# Patient Record
Sex: Female | Born: 1955 | Race: White | Hispanic: Refuse to answer | Smoking: Former smoker
Health system: Southern US, Community
[De-identification: ages and names within clinical notes are randomized; demographics above are authoritative.]

## PROBLEM LIST (undated history)

## (undated) DIAGNOSIS — S42409A Unspecified fracture of lower end of unspecified humerus, initial encounter for closed fracture: Secondary | ICD-10-CM

## (undated) DIAGNOSIS — T7840XA Allergy, unspecified, initial encounter: Secondary | ICD-10-CM

## (undated) DIAGNOSIS — M1990 Unspecified osteoarthritis, unspecified site: Secondary | ICD-10-CM

## (undated) DIAGNOSIS — E119 Type 2 diabetes mellitus without complications: Secondary | ICD-10-CM

## (undated) DIAGNOSIS — F329 Major depressive disorder, single episode, unspecified: Secondary | ICD-10-CM

## (undated) DIAGNOSIS — F419 Anxiety disorder, unspecified: Secondary | ICD-10-CM

## (undated) DIAGNOSIS — I10 Essential (primary) hypertension: Secondary | ICD-10-CM

## (undated) DIAGNOSIS — H269 Unspecified cataract: Secondary | ICD-10-CM

## (undated) DIAGNOSIS — E785 Hyperlipidemia, unspecified: Secondary | ICD-10-CM

## (undated) DIAGNOSIS — F32A Depression, unspecified: Secondary | ICD-10-CM

## (undated) HISTORY — DX: Unspecified osteoarthritis, unspecified site: M19.90

## (undated) HISTORY — PX: OTHER SURGICAL HISTORY: SHX169

## (undated) HISTORY — PX: FRACTURE SURGERY: SHX138

## (undated) HISTORY — DX: Depression, unspecified: F32.A

## (undated) HISTORY — DX: Essential (primary) hypertension: I10

## (undated) HISTORY — DX: Unspecified cataract: H26.9

## (undated) HISTORY — DX: Hyperlipidemia, unspecified: E78.5

## (undated) HISTORY — PX: TUBAL LIGATION: SHX77

## (undated) HISTORY — DX: Anxiety disorder, unspecified: F41.9

## (undated) HISTORY — DX: Type 2 diabetes mellitus without complications: E11.9

## (undated) HISTORY — DX: Major depressive disorder, single episode, unspecified: F32.9

## (undated) HISTORY — DX: Allergy, unspecified, initial encounter: T78.40XA

---

## 1998-02-14 ENCOUNTER — Emergency Department (HOSPITAL_COMMUNITY): Admission: EM | Admit: 1998-02-14 | Discharge: 1998-02-14 | Payer: Self-pay | Admitting: Emergency Medicine

## 1998-02-20 ENCOUNTER — Ambulatory Visit (HOSPITAL_COMMUNITY): Admission: RE | Admit: 1998-02-20 | Discharge: 1998-02-20 | Payer: Self-pay | Admitting: Internal Medicine

## 1998-05-14 ENCOUNTER — Emergency Department (HOSPITAL_COMMUNITY): Admission: EM | Admit: 1998-05-14 | Discharge: 1998-05-14 | Payer: Self-pay | Admitting: Emergency Medicine

## 1998-09-11 ENCOUNTER — Encounter: Payer: Self-pay | Admitting: Obstetrics and Gynecology

## 1998-09-11 ENCOUNTER — Ambulatory Visit (HOSPITAL_COMMUNITY): Admission: RE | Admit: 1998-09-11 | Discharge: 1998-09-11 | Payer: Self-pay | Admitting: Obstetrics and Gynecology

## 1998-09-21 ENCOUNTER — Ambulatory Visit (HOSPITAL_COMMUNITY): Admission: RE | Admit: 1998-09-21 | Discharge: 1998-09-21 | Payer: Self-pay | Admitting: Obstetrics and Gynecology

## 1998-09-21 ENCOUNTER — Encounter: Payer: Self-pay | Admitting: Obstetrics and Gynecology

## 1999-04-07 ENCOUNTER — Encounter: Payer: Self-pay | Admitting: Obstetrics and Gynecology

## 1999-04-07 ENCOUNTER — Ambulatory Visit (HOSPITAL_COMMUNITY): Admission: RE | Admit: 1999-04-07 | Discharge: 1999-04-07 | Payer: Self-pay | Admitting: Obstetrics and Gynecology

## 2000-07-06 ENCOUNTER — Other Ambulatory Visit: Admission: RE | Admit: 2000-07-06 | Discharge: 2000-07-06 | Payer: Self-pay | Admitting: Obstetrics and Gynecology

## 2000-07-10 ENCOUNTER — Encounter: Payer: Self-pay | Admitting: Obstetrics and Gynecology

## 2000-07-10 ENCOUNTER — Ambulatory Visit (HOSPITAL_COMMUNITY): Admission: RE | Admit: 2000-07-10 | Discharge: 2000-07-10 | Payer: Self-pay | Admitting: Obstetrics and Gynecology

## 2000-08-01 ENCOUNTER — Encounter: Payer: Self-pay | Admitting: Obstetrics and Gynecology

## 2000-08-01 ENCOUNTER — Ambulatory Visit (HOSPITAL_COMMUNITY): Admission: RE | Admit: 2000-08-01 | Discharge: 2000-08-01 | Payer: Self-pay | Admitting: Obstetrics and Gynecology

## 2000-09-11 ENCOUNTER — Encounter: Payer: Self-pay | Admitting: Obstetrics and Gynecology

## 2000-09-11 ENCOUNTER — Ambulatory Visit (HOSPITAL_COMMUNITY): Admission: RE | Admit: 2000-09-11 | Discharge: 2000-09-11 | Payer: Self-pay | Admitting: Obstetrics and Gynecology

## 2001-07-11 ENCOUNTER — Other Ambulatory Visit: Admission: RE | Admit: 2001-07-11 | Discharge: 2001-07-11 | Payer: Self-pay | Admitting: *Deleted

## 2001-07-27 ENCOUNTER — Encounter (INDEPENDENT_AMBULATORY_CARE_PROVIDER_SITE_OTHER): Payer: Self-pay | Admitting: Specialist

## 2001-07-27 ENCOUNTER — Ambulatory Visit (HOSPITAL_COMMUNITY): Admission: RE | Admit: 2001-07-27 | Discharge: 2001-07-27 | Payer: Self-pay | Admitting: *Deleted

## 2001-10-25 ENCOUNTER — Ambulatory Visit (HOSPITAL_COMMUNITY): Admission: RE | Admit: 2001-10-25 | Discharge: 2001-10-25 | Payer: Self-pay | Admitting: *Deleted

## 2002-08-15 ENCOUNTER — Other Ambulatory Visit: Admission: RE | Admit: 2002-08-15 | Discharge: 2002-08-15 | Payer: Self-pay | Admitting: *Deleted

## 2002-10-28 ENCOUNTER — Ambulatory Visit (HOSPITAL_COMMUNITY): Admission: RE | Admit: 2002-10-28 | Discharge: 2002-10-28 | Payer: Self-pay | Admitting: *Deleted

## 2003-06-18 ENCOUNTER — Encounter: Payer: Self-pay | Admitting: Emergency Medicine

## 2003-06-18 ENCOUNTER — Emergency Department (HOSPITAL_COMMUNITY): Admission: EM | Admit: 2003-06-18 | Discharge: 2003-06-18 | Payer: Self-pay | Admitting: Emergency Medicine

## 2003-09-09 ENCOUNTER — Other Ambulatory Visit: Admission: RE | Admit: 2003-09-09 | Discharge: 2003-09-09 | Payer: Self-pay | Admitting: *Deleted

## 2004-03-01 ENCOUNTER — Ambulatory Visit (HOSPITAL_COMMUNITY): Admission: RE | Admit: 2004-03-01 | Discharge: 2004-03-01 | Payer: Self-pay | Admitting: *Deleted

## 2004-08-10 ENCOUNTER — Encounter (INDEPENDENT_AMBULATORY_CARE_PROVIDER_SITE_OTHER): Payer: Self-pay | Admitting: *Deleted

## 2004-08-10 ENCOUNTER — Ambulatory Visit (HOSPITAL_COMMUNITY): Admission: RE | Admit: 2004-08-10 | Discharge: 2004-08-10 | Payer: Self-pay | Admitting: Gastroenterology

## 2005-03-02 ENCOUNTER — Ambulatory Visit (HOSPITAL_COMMUNITY): Admission: RE | Admit: 2005-03-02 | Discharge: 2005-03-02 | Payer: Self-pay | Admitting: *Deleted

## 2006-12-25 ENCOUNTER — Ambulatory Visit (HOSPITAL_COMMUNITY): Admission: RE | Admit: 2006-12-25 | Discharge: 2006-12-25 | Payer: Self-pay | Admitting: Family Medicine

## 2007-01-24 ENCOUNTER — Ambulatory Visit (HOSPITAL_COMMUNITY): Admission: RE | Admit: 2007-01-24 | Discharge: 2007-01-24 | Payer: Self-pay | Admitting: Family Medicine

## 2007-04-05 ENCOUNTER — Ambulatory Visit (HOSPITAL_COMMUNITY): Admission: RE | Admit: 2007-04-05 | Discharge: 2007-04-05 | Payer: Self-pay | Admitting: Cardiology

## 2007-04-05 ENCOUNTER — Encounter (INDEPENDENT_AMBULATORY_CARE_PROVIDER_SITE_OTHER): Payer: Self-pay | Admitting: Cardiology

## 2009-12-22 ENCOUNTER — Emergency Department (HOSPITAL_COMMUNITY): Admission: EM | Admit: 2009-12-22 | Discharge: 2009-12-22 | Payer: Self-pay | Admitting: Family Medicine

## 2009-12-25 ENCOUNTER — Ambulatory Visit: Payer: Self-pay | Admitting: Cardiovascular Disease

## 2009-12-25 ENCOUNTER — Inpatient Hospital Stay (HOSPITAL_COMMUNITY): Admission: RE | Admit: 2009-12-25 | Discharge: 2009-12-27 | Payer: Self-pay | Admitting: Internal Medicine

## 2010-10-31 ENCOUNTER — Encounter: Payer: Self-pay | Admitting: Family Medicine

## 2011-01-03 LAB — BASIC METABOLIC PANEL
CO2: 28 mEq/L (ref 19–32)
Calcium: 9.3 mg/dL (ref 8.4–10.5)
Chloride: 105 mEq/L (ref 96–112)
GFR calc non Af Amer: >60 mL/min (ref >60)
Glucose, Bld: 109 mg/dL — ABNORMAL HIGH (ref 70–99)
Potassium: 3.9 mEq/L (ref 3.5–5.1)

## 2011-01-03 LAB — CBC
HCT: 40.5 % (ref 36.0–46.0)
MCHC: 34.1 g/dL (ref 30.0–36.0)
MCV: 88.6 fL (ref 78.0–100.0)
RBC: 4.57 MIL/uL (ref 3.87–5.11)

## 2011-01-03 LAB — GLUCOSE, CAPILLARY: Glucose-Capillary: 116 mg/dL — ABNORMAL HIGH (ref 70–99)

## 2011-01-03 LAB — D-DIMER, QUANTITATIVE: D-Dimer, Quant: 1.34 ug/mL-FEU — ABNORMAL HIGH (ref 0.00–0.48)

## 2011-01-03 LAB — URINALYSIS, ROUTINE W REFLEX MICROSCOPIC
Ketones, ur: NEGATIVE mg/dL
Protein, ur: NEGATIVE mg/dL

## 2011-01-03 LAB — TROPONIN I: Troponin I: <0.01 ng/mL (ref 0.00–0.06)

## 2011-01-03 LAB — URINE MICROSCOPIC-ADD ON

## 2011-01-03 LAB — PROTIME-INR
INR: 1 (ref 0.00–1.49)
INR: 1.04 (ref 0.00–1.49)
Prothrombin Time: 13.1 seconds (ref 11.6–15.2)
Prothrombin Time: 13.5 seconds (ref 11.6–15.2)
Prothrombin Time: 16 seconds — ABNORMAL HIGH (ref 11.6–15.2)

## 2011-01-03 LAB — CK TOTAL AND CKMB (NOT AT ARMC): Relative Index: 1.9 (ref 0.0–2.5)

## 2011-02-19 IMAGING — CT CT ANGIO CHEST
2 of 6 series · 19 of 36 positions shown · IV contrast (APPLIED)
Comparison: Two-view chest x-ray 12/26/2007.

CLINICAL DATA: Status post right ankle surgery.  Shortness of
breath.  Elevated D-dimer.

CT ANGIOGRAPHY CHEST WITH CONTRAST
TECHNIQUE: Multidetector CT imaging of the chest was performed
using the standard protocol during bolus administration of
intravenous contrast.  Multiplanar CT image reconstructions
including MIPs were obtained to evaluate the vascular anatomy.
Contrast:  100 ml Omnipaque 300

[Series 9: pulm embolism 1.0 b25f thins · axial · 0.56mm/px · z∈[+2,+192]mm · 18 of 213 slices shown]
[im 11/213  lung]
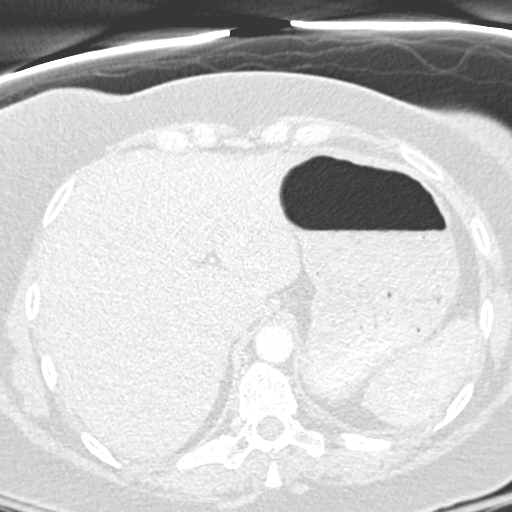
[im 22/213  mediastinal]
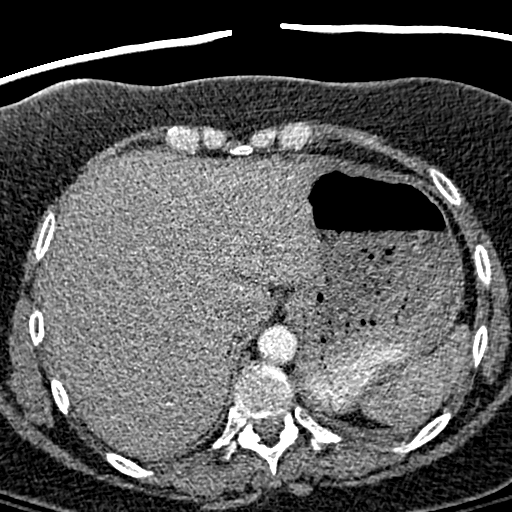
[im 32/213  lung]
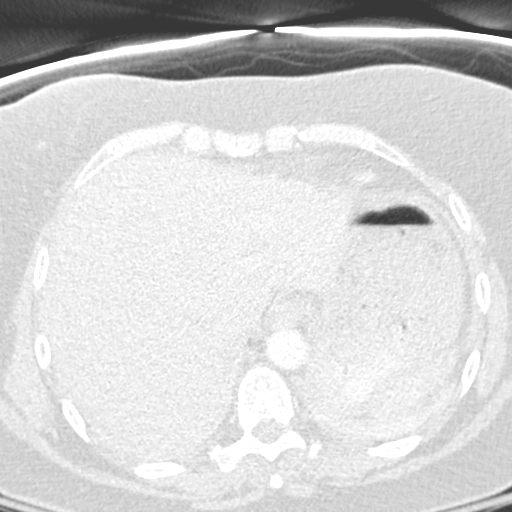
[im 43/213  mediastinal]
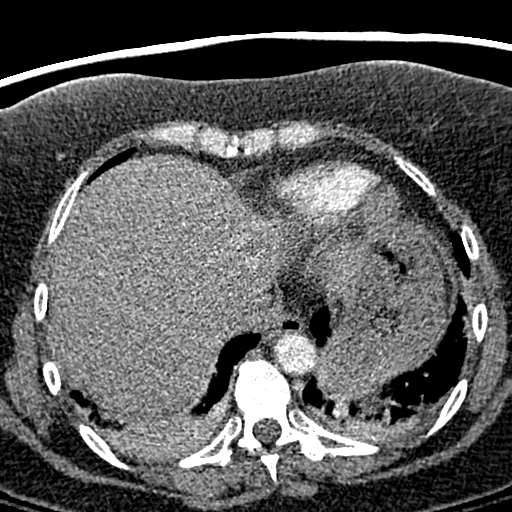
[im 54/213  lung]
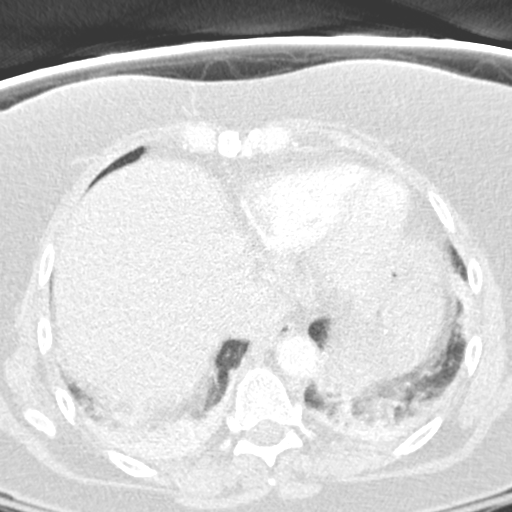
[im 64/213  mediastinal]
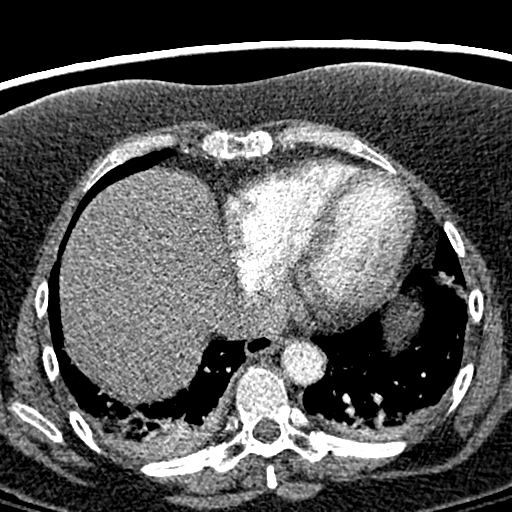
[im 75/213  lung]
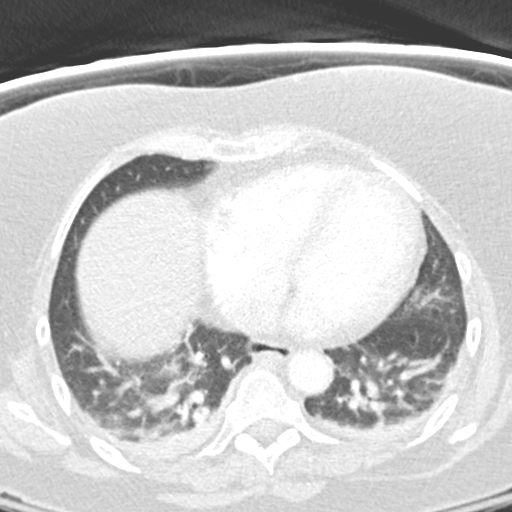
[im 85/213  mediastinal]
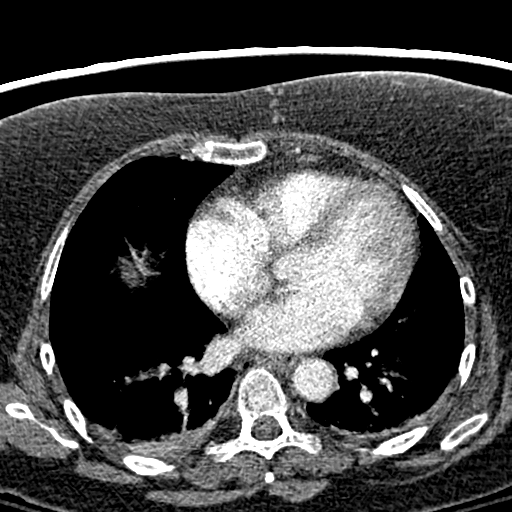
[im 96/213  lung]
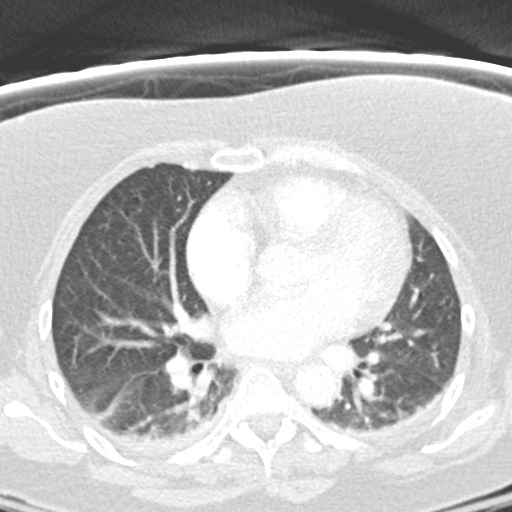
[im 117/213  mediastinal]
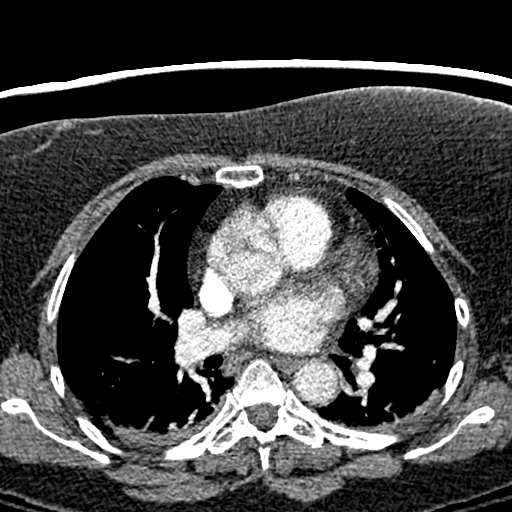
[im 128/213  lung]
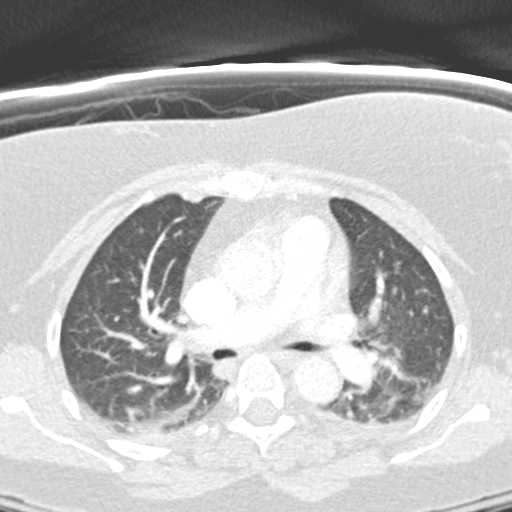
[im 138/213  mediastinal]
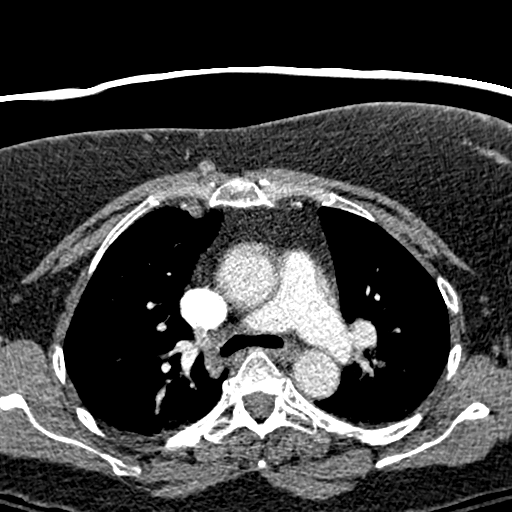
[im 149/213  lung]
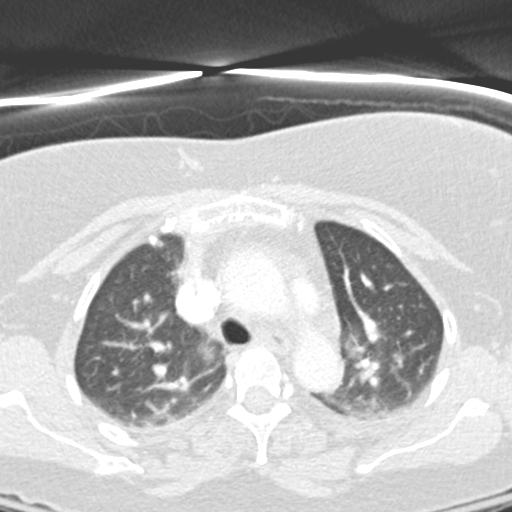
[im 160/213  mediastinal]
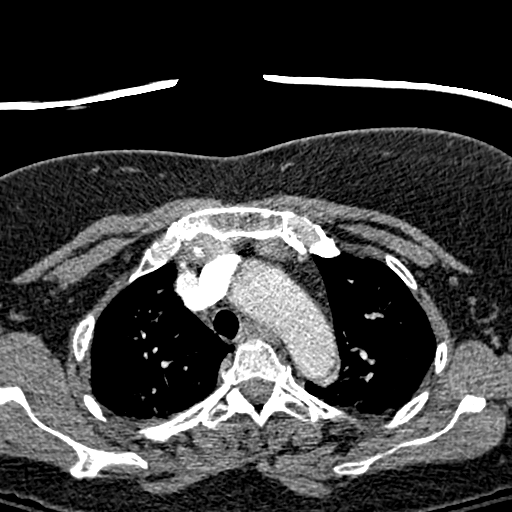
[im 170/213  lung]
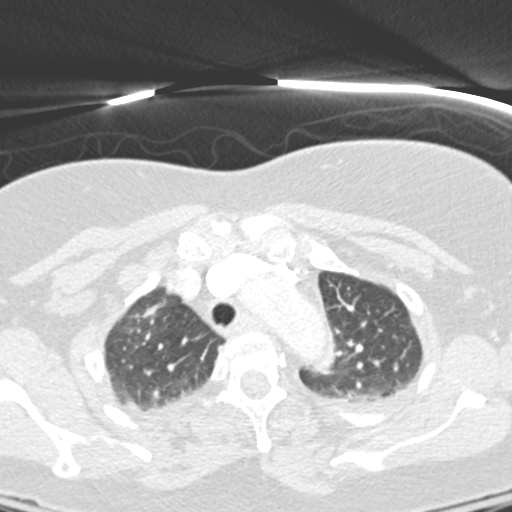
[im 181/213  mediastinal]
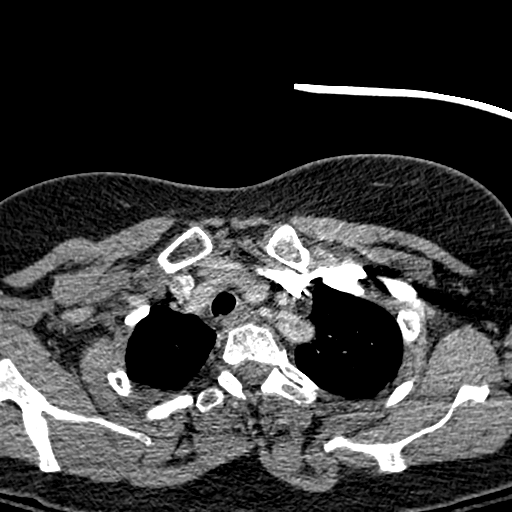
[im 191/213  lung]
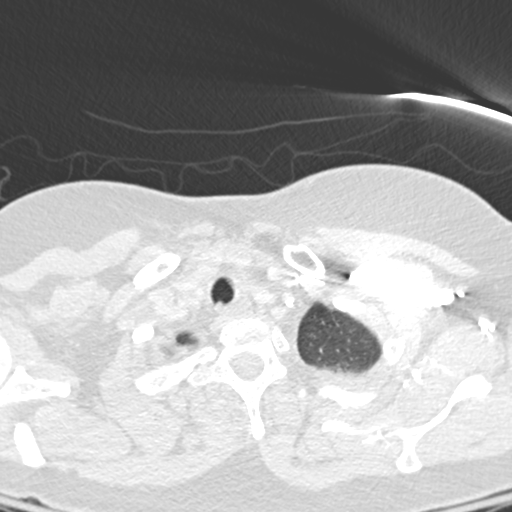
[im 202/213  mediastinal]
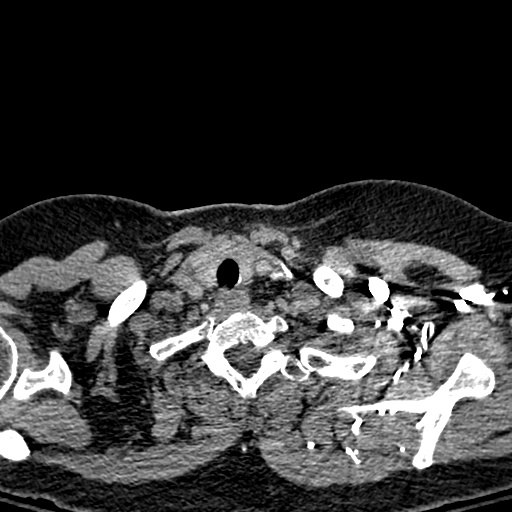

[Series 605: cor · coronal · 0.56mm/px · 1 of 62 slices shown]
[im 31/62  mediastinal]
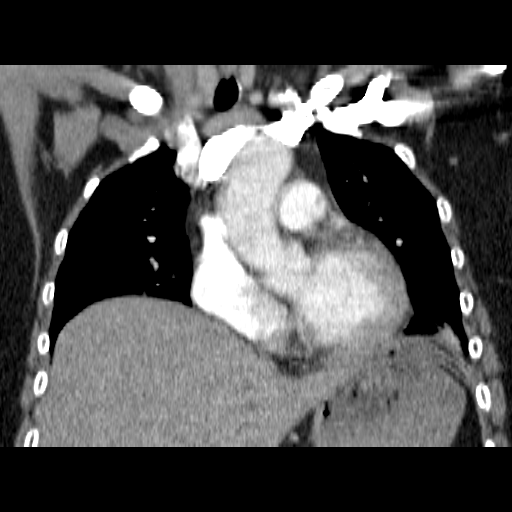

[19 of 36 positions shown; findings below may reference images not displayed]

FINDINGS: There is satisfactory opacification of the pulmonary
arterial system.  The study is mildly degraded by patient breathing
motion.  No focal filling defects are present to suggest pulmonary
emboli.  The heart size is normal.  No significant mediastinal or
axillary adenopathy is present.

Limited imaging of the upper abdomen is unremarkable.

Moderate dependent atelectasis is seen bilaterally.  There is
linear atelectasis in the right upper lobe as well.  No other
significant non dependent airspace disease is present.

Bone windows are unremarkable.

Review of the MIP images confirms the above findings.
IMPRESSION: 1.  No evidence for pulmonary embolus.
2.  Low lung volumes and moderate bilateral dependent atelectasis.

## 2011-02-25 NOTE — Op Note (Signed)
[facility]  Patient:    POONAM, WOEHRLE Visit Number: [id number] MRN: [id number]          Service Type: DSU Location: DAY Attending Physician:  Marin Comment Proc. Date: 07/27/01 Admit Date:  07/27/2001   CC:         Carola J. Gerri Spore, M.D.   Operative Report  PREOPERATIVE DIAGNOSES:  Menorrhagia and endometrial biopsy suggesting a ______  endometrial polyp.  POSTOPERATIVE DIAGNOSES:  Normal endometrium.  No evidence of polyp of submucosal myoma.  Enlarged uterine cavity.  PROCEDURE:  Exam under anesthesia, fractional D&C, and hysteroscopy.  ANESTHESIA:  MAC plus Marcaine paracervical block. resection of posterior wall consistent with submucosal myoma.  SURGEON:  Pershing Cox, M.D.  INDICATIONS:  The patient is a 55 year old gravida 4, para 2-0-2-2, female status post tubal ligation.  Endometrial biopsy was performed prompted by heavy vaginal bleeding.  This endometrial biopsy showed central necrosis of a fragment suggestive of endometrial polyp and for that reason, the patient is brought to the operating room today.  OPERATIVE FINDINGS:  The uterus itself was approximately 8 weeks in size, maybe slightly larger.  The fundus was not expanded.  On exam under anesthesia, no adnexal masses.  The cavity sounded to 3.5 inches.  The hysteroscopic diagnosis showed the cavity to be quite broad but atrophic. There were no polyps and no evidence of submucosal myoma.  DESCRIPTION OF PROCEDURE:  Albesa Seen was brought to the operating room with an IV in place.  On the OR table, Ancef was delivered in a dosage of 1 g. She had mottled facial and upper chest discoloration before the Ancef was administered.  She had no changes whatsoever to suggest an allergy to this medication.  The patient was sedated with MAC anesthesia and then was placed into Allen stirrups.  Hibiclens was used to perform a sterile vaginal and perineal prep.   The bladder was drained sterilely.  She was then draped for a vaginal procedure with a collecting drape beneath her hips to manage the effluent from our procedure.  Bivalve speculum was inserted into the vagina.  The cervix was visualized, and Marcaine was injected into the anterior cervix which was grasped with a single-tooth tenaculum.  Marcaine 0.25% was injected into the paracervical tissues at the 3, 4, 7, and 8 position to effect a a paracervical block, and 20 cc of this solution were used.  Kevorkian curette was used to obtain endocervical curettings.  Sound then passed into the endometrial cavity.  The cervix was then dilated with serial Pratt dilators to size 23.  The hysteroscope was introduced and using through-and-through sorbitol irrigation, the cavity was visualized and photographs were taken to document.  Resection was not necessary.  A small sharp curette was used to serially curette the uterine walls, and tissue was separately collected as endometrial curettings. The patient tolerated the procedure well and was taken to the recovery room in good condition. Attending Physician:  Marin Comment DD:  07/27/01 TD:  07/28/01 Job: [id number]

## 2011-02-25 NOTE — Op Note (Signed)
[name], [name]              ACCOUNT NO.:  [id number]   MEDICAL RECORD NO.:  [id number]          PATIENT TYPE:  AMB   LOCATION:  ENDO                         FACILITY:  [facility]   PHYSICIAN:  Danise Edge, M.D.   DATE OF BIRTH:  12/01/1955   DATE OF PROCEDURE:  08/10/2004  DATE OF DISCHARGE:                                 OPERATIVE REPORT   PROCEDURE:  Colonoscopy.   INDICATIONS FOR PROCEDURE:  Ms. Connie Wilson is a 55 year old female born  07-Sep-1956.  Ms. Connie Wilson has chronic lower abdominal cramping/bloating  with chronic constipation unassociated with gastrointestinal feeding.  Since  starting MiraLax, her intestinal symptoms have improved.   ENDOSCOPIST:  Danise Edge, M.D.   PREMEDICATION:  Versed 9.5 mg, Demerol 70 mg.   DESCRIPTION OF PROCEDURE:  After obtaining informed consent, Connie Wilson  was placed in the left lateral decubitus position.  I administered  intravenous Demerol and intravenous Versed to achieve conscious sedation for  the procedure.  The patient's blood pressure, oxygen saturation, and cardiac  rhythm were monitored throughout the procedure and documented in the medical  record.   Anal inspection and digital rectal exam were normal.  The Olympus adjustable  pediatric colonoscope was introduced into the rectum and advanced to the  cecum.  The colonic preparation for the exam today was excellent after she  consumed the MiraLax/Gatorade prep.   Rectum:  From the proximal rectum, a 1-mm sessile polyp was removed with  cold biopsy forceps.   Sigmoid colon and descending colon:  Normal.   Splenic flexure:  Normal.   Transverse colon:  Normal.   Hepatic flexure:  Normal.   Ascending colon:  Normal.   Cecum and ileocecal valve:  Normal.   ASSESSMENT:  A diminutive polyp was removed from the proximal rectum;  otherwise, normal proctocolonoscopy to the cecum.   RECOMMENDATIONS:  Ms. Connie Wilson can safely consume MiraLax daily or even  twice daily indefinitely to control her chronic constipation and bloating.   If the proximal rectal polyp returns neoplastic pathologically, she should  undergo a repeat colonoscopy in five years.      MJ/MEDQ  D:  08/10/2004  T:  08/10/2004  Job:  [id number]   cc:   Gretta Arab. Valentina Lucks, M.D.  [address] [place]  [place] [address]  Fax: [phone]

## 2011-09-15 ENCOUNTER — Ambulatory Visit: Payer: Self-pay

## 2011-09-15 DIAGNOSIS — E782 Mixed hyperlipidemia: Secondary | ICD-10-CM

## 2011-09-15 DIAGNOSIS — F339 Major depressive disorder, recurrent, unspecified: Secondary | ICD-10-CM

## 2011-09-15 DIAGNOSIS — I10 Essential (primary) hypertension: Secondary | ICD-10-CM

## 2011-09-15 DIAGNOSIS — M545 Low back pain: Secondary | ICD-10-CM

## 2012-01-06 ENCOUNTER — Ambulatory Visit: Payer: Self-pay | Admitting: Internal Medicine

## 2012-01-06 VITALS — BP 125/80 | HR 77 | Temp 97.9°F | Resp 16 | Ht 68.5 in | Wt 219.4 lb

## 2012-01-06 DIAGNOSIS — Z833 Family history of diabetes mellitus: Secondary | ICD-10-CM

## 2012-01-06 DIAGNOSIS — E669 Obesity, unspecified: Secondary | ICD-10-CM

## 2012-01-06 DIAGNOSIS — E785 Hyperlipidemia, unspecified: Secondary | ICD-10-CM

## 2012-01-06 DIAGNOSIS — I10 Essential (primary) hypertension: Secondary | ICD-10-CM | POA: Insufficient documentation

## 2012-01-06 LAB — POCT CBC
HCT, POC: 40.9 % (ref 37.7–47.9)
Hemoglobin: 13.5 g/dL (ref 12.2–16.2)
MCH, POC: 28.4 pg (ref 27–31.2)
MCV: 85.9 fL (ref 80–97)
RBC: 4.76 M/uL (ref 4.04–5.48)
WBC: 7.5 10*3/uL (ref 4.6–10.2)

## 2012-01-06 LAB — COMPREHENSIVE METABOLIC PANEL
ALT: 24 U/L (ref 0–35)
Alkaline Phosphatase: 75 U/L (ref 39–117)
Sodium: 138 mEq/L (ref 135–145)
Total Bilirubin: 0.4 mg/dL (ref 0.3–1.2)
Total Protein: 7 g/dL (ref 6.0–8.3)

## 2012-01-06 LAB — LIPID PANEL
HDL: 54 mg/dL (ref >39)
LDL Cholesterol: 129 mg/dL — ABNORMAL HIGH (ref 0–99)
Total CHOL/HDL Ratio: 4.1 Ratio

## 2012-01-06 MED ORDER — LISINOPRIL 20 MG PO TABS: 20.0000 mg | ORAL_TABLET | Freq: Every day | ORAL | Status: DC

## 2012-01-06 MED ORDER — CLONAZEPAM 0.5 MG PO TABS: ORAL_TABLET | ORAL | Status: DC

## 2012-01-06 MED ORDER — SERTRALINE HCL 50 MG PO TABS: 50.0000 mg | ORAL_TABLET | Freq: Three times a day (TID) | ORAL | Status: DC

## 2012-01-06 NOTE — Patient Instructions (Signed)
We will call you on Monday or mail results with your results from the lab/\.  RTC in 6 months.

## 2012-01-06 NOTE — Progress Notes (Signed)
  Subjective:    Patient ID: Connie Wilson, female    DOB: July 15, 1956, 56 y.o.   MRN: [id number]  Hypertension This is a chronic problem. Associated symptoms include anxiety. Pertinent negatives include no chest pain, headaches or palpitations. There are no associated agents to hypertension. Risk factors for coronary artery disease include post-menopausal state, obesity, stress and sedentary lifestyle.  Connie Wilson is here for a refill on her cholesterol, depression and BP  Medications.  She denies any significant changes to her health since we last saw her in Dec 2012.  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  She denies any CP and tells me her BP has been fine.  She does have a grandmother who had DM but she herself has never been diagnosed with a glucose problem.  She has no insurance and is under financial constraints.    Review of Systems  Constitutional: Negative.   HENT: Negative.   Eyes: Negative.   Respiratory: Negative.   Cardiovascular: Negative.  Negative for chest pain and palpitations.  Gastrointestinal: Negative.   Genitourinary: Negative.   Musculoskeletal: Negative.   Neurological: Negative.  Negative for headaches.  Hematological: Negative.   Psychiatric/Behavioral: Negative.   All other systems reviewed and are negative.       Objective:   Physical Exam  Vitals reviewed. Constitutional: She is oriented to person, place, and time. She appears well-developed and well-nourished.       Obese, weight gain 3 pounds since last OV    HENT:  Head: Normocephalic.  Eyes: Conjunctivae are normal.  Neck: Neck supple. No tracheal deviation present. No thyromegaly present.  Cardiovascular: Normal rate, regular rhythm and normal heart sounds.   Musculoskeletal: Normal range of motion.  Neurological: She is alert and oriented to person, place, and time.  Skin: Skin is warm and dry.  Psychiatric: She has a normal mood and affect. Her behavior is normal.           Assessment & Plan:  1.  HTN--good control, continue meds.  PT unable to give urine specimen, will collect at next OV.  RTC in 6 months for routine OV. 2.  Hyperlipidemia with elevated triglycerides.  Not fasting today.  Told to come fasting to next OV, pt agrees.  CMP and Lipid Panel pending. 3.  Depression--RF on her Zoloft TID and Clonazepam. 4.  Family History of DM--A1C is 5.6.

## 2012-01-09 ENCOUNTER — Other Ambulatory Visit: Payer: Self-pay

## 2012-01-09 ENCOUNTER — Encounter: Payer: Self-pay | Admitting: Radiology

## 2012-01-25 ENCOUNTER — Telehealth: Payer: Self-pay

## 2012-01-25 MED ORDER — ATORVASTATIN CALCIUM 40 MG PO TABS: ORAL_TABLET | ORAL | Status: DC

## 2012-01-25 NOTE — Telephone Encounter (Signed)
Pt states she was seen last week by Delton See and Delton See was to call in rx for cholestrol but it has not.

## 2012-01-25 NOTE — Telephone Encounter (Signed)
LMOM to cb

## 2012-01-25 NOTE — Telephone Encounter (Signed)
Please tell pt we will try her on atorvastatin instead of the pravachol which should be more effective in reducing her LDL cholesterol.  Hold the fenofibrate for now.  Please take 2 fish oil daily.

## 2012-01-26 NOTE — Telephone Encounter (Signed)
Pt called back and LM stating that the atorvastatin is much too expensive ($70 for 30 days) and she will either need to stay on the inexpensive chol med she has been on (pravastatin) unless there is another cheap alternative. Please advise and send in new Rx to pharm

## 2012-01-26 NOTE — Telephone Encounter (Signed)
Spoke with patient and explained to her to take atorvastatin and stop the fenofibrate and take 2 fish oil daily.  Patient stated that she understood directions.

## 2012-01-30 MED ORDER — PRAVASTATIN SODIUM 40 MG PO TABS: 40.0000 mg | ORAL_TABLET | Freq: Three times a day (TID) | ORAL | Status: DC

## 2012-01-30 NOTE — Telephone Encounter (Signed)
Tell pt she can return to the Pravastatin medication but needs to redouble her efforts in lifestyle modifications (exercise and any needed weight loss).  I suggest she make an appointment with Dr. Audria Nine at 102 and go in fasting in two months to see where her cholesterol is, it may be improved at that time.  Thanks.

## 2012-01-30 NOTE — Telephone Encounter (Signed)
Spoke with patient and let her know that she should stop the fenefibrate and that we cannot write another rx for pravastatin until she came in for a check up.  Patient stated that she understood.

## 2012-01-30 NOTE — Telephone Encounter (Signed)
Spoke with patient and told her to start the pravastatin again and make sure she eats right and exercises.  Also told her to make an appointment with Dr Audria Nine in two months to recheck cholesterol.  Patient stated that she understood and she didn't have any other questions at this time.

## 2012-01-30 NOTE — Telephone Encounter (Signed)
I want to keep her prescriptions as written to assist her in returning for a recheck in 2 months while she is on the medications, do not want her to be on the fenofibrate at this time

## 2012-01-30 NOTE — Telephone Encounter (Signed)
Patient called back and stated that she forgot to ask if we could fill pravastatin and fenofibrate for three month supply at [facility] on [address].

## 2012-03-21 ENCOUNTER — Other Ambulatory Visit: Payer: Self-pay | Admitting: Physician Assistant

## 2012-05-24 ENCOUNTER — Ambulatory Visit: Payer: Self-pay | Admitting: Emergency Medicine

## 2012-05-24 VITALS — BP 112/78 | HR 80 | Temp 98.1°F | Resp 16 | Ht 68.5 in | Wt 212.0 lb

## 2012-05-24 DIAGNOSIS — E785 Hyperlipidemia, unspecified: Secondary | ICD-10-CM

## 2012-05-24 DIAGNOSIS — K5289 Other specified noninfective gastroenteritis and colitis: Secondary | ICD-10-CM

## 2012-05-24 DIAGNOSIS — F341 Dysthymic disorder: Secondary | ICD-10-CM

## 2012-05-24 DIAGNOSIS — K529 Noninfective gastroenteritis and colitis, unspecified: Secondary | ICD-10-CM

## 2012-05-24 DIAGNOSIS — I10 Essential (primary) hypertension: Secondary | ICD-10-CM

## 2012-05-24 DIAGNOSIS — F329 Major depressive disorder, single episode, unspecified: Secondary | ICD-10-CM

## 2012-05-24 LAB — POCT UA - MICROSCOPIC ONLY
Casts, Ur, LPF, POC: NEGATIVE
Mucus, UA: NEGATIVE
Yeast, UA: NEGATIVE

## 2012-05-24 LAB — POCT CBC
Granulocyte percent: 78 %G (ref 37–80)
MCV: 88.6 fL (ref 80–97)
MID (cbc): 0.5 (ref 0–0.9)
MPV: 11.4 fL (ref 0–99.8)
POC Granulocyte: 6.4 (ref 2–6.9)
Platelet Count, POC: 260 10*3/uL (ref 142–424)
RBC: 4.96 M/uL (ref 4.04–5.48)
RDW, POC: 14.8 %

## 2012-05-24 LAB — COMPREHENSIVE METABOLIC PANEL
ALT: 20 U/L (ref 0–35)
AST: 16 U/L (ref 0–37)
Albumin: 4.3 g/dL (ref 3.5–5.2)
Alkaline Phosphatase: 82 U/L (ref 39–117)
BUN: 14 mg/dL (ref 6–23)
Calcium: 9.6 mg/dL (ref 8.4–10.5)
Chloride: 102 mEq/L (ref 96–112)
Potassium: 4.1 mEq/L (ref 3.5–5.3)
Sodium: 138 mEq/L (ref 135–145)
Total Protein: 7.2 g/dL (ref 6.0–8.3)

## 2012-05-24 LAB — POCT URINALYSIS DIPSTICK
Bilirubin, UA: NEGATIVE
Ketones, UA: NEGATIVE
Protein, UA: NEGATIVE
Spec Grav, UA: 1.02
pH, UA: 5

## 2012-05-24 LAB — LIPASE: Lipase: 43 U/L (ref 0–75)

## 2012-05-24 LAB — LIPID PANEL
Cholesterol: 251 mg/dL — ABNORMAL HIGH (ref 0–200)
Triglycerides: 200 mg/dL — ABNORMAL HIGH (ref <150)

## 2012-05-24 MED ORDER — LISINOPRIL 20 MG PO TABS: 20.0000 mg | ORAL_TABLET | Freq: Every day | ORAL | Status: DC

## 2012-05-24 MED ORDER — FENOFIBRATE 54 MG PO TABS: 54.0000 mg | ORAL_TABLET | Freq: Every day | ORAL | Status: DC

## 2012-05-24 MED ORDER — CLONAZEPAM 0.5 MG PO TABS: ORAL_TABLET | ORAL | Status: DC

## 2012-05-24 MED ORDER — SERTRALINE HCL 50 MG PO TABS: 50.0000 mg | ORAL_TABLET | Freq: Three times a day (TID) | ORAL | Status: DC

## 2012-05-24 MED ORDER — PROMETHAZINE HCL 25 MG PO TABS: 25.0000 mg | ORAL_TABLET | Freq: Three times a day (TID) | ORAL | Status: DC | PRN

## 2012-05-24 NOTE — Patient Instructions (Addendum)
1. Gastroenteritis  POCT urinalysis dipstick, POCT UA - Microscopic Only, POCT CBC, Comprehensive metabolic panel, Lipase, Amylase  2. Hypertension    3. Dyslipidemia  Lipid panel  4. Anxiety and depression    Viral Gastroenteritis Viral gastroenteritis is also known as stomach flu. This condition affects the stomach and intestinal tract. It can cause sudden diarrhea and vomiting. The illness typically lasts 3 to 8 days. Most people develop an immune response that eventually gets rid of the virus. While this natural response develops, the virus can make you quite ill. CAUSES  Many different viruses can cause gastroenteritis, such as rotavirus or noroviruses. You can catch one of these viruses by consuming contaminated food or water. You may also catch a virus by sharing utensils or other personal items with an infected person or by touching a contaminated surface. SYMPTOMS  The most common symptoms are diarrhea and vomiting. These problems can cause a severe loss of body fluids (dehydration) and a body salt (electrolyte) imbalance. Other symptoms may include:  Fever.   Headache.   Fatigue.   Abdominal pain.  DIAGNOSIS  Your caregiver can usually diagnose viral gastroenteritis based on your symptoms and a physical exam. A stool sample may also be taken to test for the presence of viruses or other infections. TREATMENT  This illness typically goes away on its own. Treatments are aimed at rehydration. The most serious cases of viral gastroenteritis involve vomiting so severely that you are not able to keep fluids down. In these cases, fluids must be given through an intravenous line (IV). HOME CARE INSTRUCTIONS   Drink enough fluids to keep your urine clear or pale yellow. Drink small amounts of fluids frequently and increase the amounts as tolerated.   Ask your caregiver for specific rehydration instructions.   Avoid:   Foods high in sugar.   Alcohol.   Carbonated drinks.   Tobacco.    Juice.   Caffeine drinks.   Extremely hot or cold fluids.   Fatty, greasy foods.   Too much intake of anything at one time.   Dairy products until 24 to 48 hours after diarrhea stops.   You may consume probiotics. Probiotics are active cultures of beneficial bacteria. They may lessen the amount and number of diarrheal stools in adults. Probiotics can be found in yogurt with active cultures and in supplements.   Wash your hands well to avoid spreading the virus.   Only take over-the-counter or prescription medicines for pain, discomfort, or fever as directed by your caregiver. Do not give aspirin to children. Antidiarrheal medicines are not recommended.   Ask your caregiver if you should continue to take your regular prescribed and over-the-counter medicines.   Keep all follow-up appointments as directed by your caregiver.  SEEK IMMEDIATE MEDICAL CARE IF:   You are unable to keep fluids down.   You do not urinate at least once every 6 to 8 hours.   You develop shortness of breath.   You notice blood in your stool or vomit. This may look like coffee grounds.   You have abdominal pain that increases or is concentrated in one small area (localized).   You have persistent vomiting or diarrhea.   You have a fever.   The patient is a child younger than 3 months, and he or she has a fever.   The patient is a child older than 3 months, and he or she has a fever and persistent symptoms.   The patient is a child  older than 3 months, and he or she has a fever and symptoms suddenly get worse.   The patient is a baby, and he or she has no tears when crying.  MAKE SURE YOU:   Understand these instructions.   Will watch your condition.   Will get help right away if you are not doing well or get worse.  Document Released: 09/26/2005 Document Revised: 09/15/2011 Document Reviewed: 07/13/2011 [facility] Patient Information 2012 [place], [place].Place gastroenteritis patient  instructions here.

## 2012-05-24 NOTE — Progress Notes (Signed)
Subjective:    Patient ID: Connie Wilson, female    DOB: December 09, 1955, 56 y.o.   MRN: [id number]  HPIThis 56 y.o. female presents for evaluation of vomiting, diarrhea, abdominal pain. Onset three days ago.  Awoke with abdominal pain.  Every time she eats, having b.m.  +nausea and vomiting.  Stools 5-6 times per day.  Gotten a rash on bottom; previously prescribed cream by other provider at other clinic.  No fever/+chills/sweats.  Tmax 98.1.  +malaise, +myalgias.  +nausea.  +vomited x 1; food contents; non-bilious non-bloody.  +diarrhea stools watery non-bloody.  +blood with wiping due to rectal irritation; using cotonelle wet wipes.  Has been using topical cream to bottom x 1 month with persistent rash; sweats a lot at work.  No sick contacts.  No antibiotics recently; no travel foreign.  No camping.  No history of diverticulitis.  Worried about Crohn's disease.  Has suffered with gas and bloating.  Does not like probiotics.  Last colonoscopy 5-6 years ago by [facility]; +polyps.  No history of diverticulosis.  No history of chronic diarrhea or chronic constipation.  Bland diet; vegetarian at baseline.    HTN:  Six month follow-up on HTN.  Compliance with medication; good tolerance to medication; good symptom control.  Does not check blood pressure at home.  Denies chest pain, palpitations,shortness of breath, or leg swelling.  Needs refills.  Hyperlipidemia: six month follow-up for cholesterol.  Stopped Pravastatin per recommendation of Benny Lennert; now only taking Fenofibrate. Compliance with fenofibrate; good tolerance of medication; good symptom control.  Fasting today.  Denies chest pain, palpitations, shortness of breath, numbness, tingling, focal weakness.    Depression/anxiety: six month follow-up.  Emotionally stable at this time.  No major stressors recently. Compliance with medications; good tolerance with medications; good symptoms control.  Denies excessive worry, sadness, isolation,  hopelessness.  Taking Sertraline tid; Klonopin tid PRN.   Has suffered with chronic insomnia since menopause age 21.  PMH:  HTN, hyperlipidemia, Depression.   PCP: [facility]/Sarah Weber     Review of Systems  Constitutional: Positive for chills, appetite change and fatigue. Negative for fever.  HENT: Negative for congestion and rhinorrhea.   Eyes: Negative for photophobia and visual disturbance.  Respiratory: Negative for cough, shortness of breath and wheezing.   Cardiovascular: Negative for chest pain, palpitations and leg swelling.  Gastrointestinal: Positive for nausea, vomiting, abdominal pain, diarrhea and anal bleeding. Negative for constipation, blood in stool, abdominal distention and rectal pain.  Skin: Negative for rash.  Neurological: Negative for dizziness, tremors, syncope, facial asymmetry, weakness, light-headedness, numbness and headaches.  Psychiatric/Behavioral: Positive for disturbed wake/sleep cycle and dysphoric mood. Negative for suicidal ideas, behavioral problems, confusion, decreased concentration and agitation. The patient is nervous/anxious. The patient is not hyperactive.     No past medical history on file.  No past surgical history on file.  Prior to Admission medications   Medication Sig Start Date End Date Taking? Authorizing Provider  calcium & magnesium carbonates (MYLANTA) 311-232 MG per tablet Take 1 tablet by mouth daily.   Yes Historical Provider, MD  clonazePAM (KLONOPIN) 0.5 MG tablet Take one in the am, two at night as needed 05/24/12  Yes Ethelda Chick, MD  clotrimazole (LOTRIMIN) 1 % cream Apply topically 2 (two) times daily.   Yes Historical Provider, MD  fenofibrate 54 MG tablet Take 1 tablet (54 mg total) by mouth daily. 05/24/12  Yes Ethelda Chick, MD  fish oil-omega-3 fatty acids 1000 MG capsule  Take 2 g by mouth daily.   Yes Historical Provider, MD  lisinopril (PRINIVIL,ZESTRIL) 20 MG tablet Take 1 tablet (20 mg total) by mouth daily. 05/24/12   Yes Ethelda Chick, MD  pravastatin (PRAVACHOL) 40 MG tablet Take 1 tablet (40 mg total) by mouth 3 (three) times daily. 01/30/12  Yes Rickard Patience, PA-C  sertraline (ZOLOFT) 50 MG tablet Take 1 tablet (50 mg total) by mouth 3 (three) times daily. 05/24/12  Yes Ethelda Chick, MD  promethazine (PHENERGAN) 25 MG tablet Take 1 tablet (25 mg total) by mouth every 8 (eight) hours as needed for nausea. 05/24/12 05/31/12  Ethelda Chick, MD    Allergies  Allergen Reactions  . Penicillins Rash    History   Social History  . Marital Status: Divorced    Spouse Name: N/A    Number of Children: N/A  . Years of Education: N/A   Occupational History  . Not on file.   Social History Main Topics  . Smoking status: Never Smoker   . Smokeless tobacco: Not on file  . Alcohol Use: No  . Drug Use: Not on file  . Sexually Active: Not on file   Other Topics Concern  . Not on file   Social History Narrative  . No narrative on file    No family history on file.    Objective:   Physical Exam  Nursing note and vitals reviewed. Constitutional: She is oriented to person, place, and time. She appears well-developed and well-nourished. No distress.  HENT:  Head: Normocephalic and atraumatic.  Mouth/Throat: Oropharynx is clear and moist.  Eyes: Conjunctivae and EOM are normal. Pupils are equal, round, and reactive to light.  Neck: Normal range of motion. Neck supple. No thyromegaly present.  Cardiovascular: Normal rate, regular rhythm, normal heart sounds and intact distal pulses.  Exam reveals no gallop and no friction rub.   No murmur heard. Pulmonary/Chest: Effort normal and breath sounds normal. No respiratory distress. She has no wheezes. She has no rales. She exhibits no tenderness.  Abdominal: Soft. Bowel sounds are normal. She exhibits no distension and no mass. There is tenderness. There is no rebound and no guarding.       +TTP EPIGASTRIC REGION>RUQ.  MILD DIFFUSE TTP; NO G/R.    Lymphadenopathy:    She has no cervical adenopathy.  Neurological: She is alert and oriented to person, place, and time. She has normal reflexes. No cranial nerve deficit.  Skin: Skin is warm and dry. She is not diaphoretic.  Psychiatric: She has a normal mood and affect. Her behavior is normal. Judgment and thought content normal.      Results for orders placed in visit on 05/24/12  POCT URINALYSIS DIPSTICK      Component Value Range   Color, UA yellow     Clarity, UA clear     Glucose, UA neg     Bilirubin, UA neg     Ketones, UA neg     Spec Grav, UA 1.020     Blood, UA trace     pH, UA 5.0     Protein, UA neg     Urobilinogen, UA 0.2     Nitrite, UA neg     Leukocytes, UA Negative    POCT UA - MICROSCOPIC ONLY      Component Value Range   WBC, Ur, HPF, POC 3-5     RBC, urine, microscopic neg     Bacteria, U Microscopic 2+  Mucus, UA neg     Epithelial cells, urine per micros 8-12     Crystals, Ur, HPF, POC neg     Casts, Ur, LPF, POC neg     Yeast, UA neg    POCT CBC      Component Value Range   WBC 8.2  4.6 - 10.2 K/uL   Lymph, poc 1.3  0.6 - 3.4   POC LYMPH PERCENT 16.1  10 - 50 %L   MID (cbc) 0.5  0 - 0.9   POC MID % 5.9  0 - 12 %M   POC Granulocyte 6.4  2 - 6.9   Granulocyte percent 78.0  37 - 80 %G   RBC 4.96  4.04 - 5.48 M/uL   Hemoglobin 13.6  12.2 - 16.2 g/dL   HCT, POC 16.1  09.6 - 47.9 %   MCV 88.6  80 - 97 fL   MCH, POC 27.4  27 - 31.2 pg   MCHC 31.0 (*) 31.8 - 35.4 g/dL   RDW, POC 04.5     Platelet Count, POC 260  142 - 424 K/uL   MPV 11.4  0 - 99.8 fL      Assessment & Plan:   1. Gastroenteritis  POCT urinalysis dipstick, POCT UA - Microscopic Only, POCT CBC, Comprehensive metabolic panel, Lipase, Amylase  2. Hypertension    3. Dyslipidemia  Lipid panel  4. Anxiety and depression     1. Gastroenteritis: new.  BRAT diet, hydration.  Rx for Phenergan provided and counseled regarding sedation side effect.  RTC if no improvement in 72  hours or sooner if worse.  If diarrhea persists more than 7-10 days, will warrant stool studies.  No suggestion of Crohn's disease at this time. 2.  HTN: controlled; no change in medications; obtain labs.  RF provided. 3.  Dyslipidemia:  Moderately controlled. No change in medications; obtain labs. RF provided. 4.  Anxiety/depression:  Stable on current regimen; RF provided.  F/u 6 months PCP.

## 2012-05-31 MED ORDER — PRAVASTATIN SODIUM 40 MG PO TABS: 40.0000 mg | ORAL_TABLET | Freq: Three times a day (TID) | ORAL | Status: DC

## 2012-06-01 MED ORDER — PRAVASTATIN SODIUM 40 MG PO TABS: 40.0000 mg | ORAL_TABLET | Freq: Every day | ORAL | Status: DC

## 2012-06-04 NOTE — Progress Notes (Signed)
Spoke with patient 06/04/12, she is not interested in scheduling a follow up appt at this time.

## 2012-09-04 ENCOUNTER — Emergency Department (INDEPENDENT_AMBULATORY_CARE_PROVIDER_SITE_OTHER)
Admission: EM | Admit: 2012-09-04 | Discharge: 2012-09-04 | Disposition: A | Discharge status: Home / Self Care | Payer: Self-pay | Source: Home / Self Care

## 2012-09-04 ENCOUNTER — Encounter (HOSPITAL_COMMUNITY): Payer: Self-pay | Admitting: *Deleted

## 2012-09-04 DIAGNOSIS — M542 Cervicalgia: Secondary | ICD-10-CM

## 2012-09-04 DIAGNOSIS — M25552 Pain in left hip: Secondary | ICD-10-CM

## 2012-09-04 DIAGNOSIS — S161XXA Strain of muscle, fascia and tendon at neck level, initial encounter: Secondary | ICD-10-CM

## 2012-09-04 DIAGNOSIS — M25559 Pain in unspecified hip: Secondary | ICD-10-CM

## 2012-09-04 DIAGNOSIS — M545 Low back pain, unspecified: Secondary | ICD-10-CM

## 2012-09-04 MED ORDER — KETOROLAC TROMETHAMINE 60 MG/2ML IM SOLN
60.0000 mg | Freq: Once | INTRAMUSCULAR | Status: AC
  Administered 2012-09-04: 60 mg via INTRAMUSCULAR

## 2012-09-04 MED ORDER — TRAMADOL HCL 50 MG PO TABS: 50.0000 mg | ORAL_TABLET | Freq: Four times a day (QID) | ORAL | Status: DC | PRN

## 2012-09-04 MED ORDER — KETOROLAC TROMETHAMINE 30 MG/ML IJ SOLN
INTRAMUSCULAR | Status: AC
  Filled 2012-09-04: qty 2

## 2012-09-04 MED ORDER — NAPROXEN 500 MG PO TBEC: 500.0000 mg | DELAYED_RELEASE_TABLET | Freq: Two times a day (BID) | ORAL | Status: DC
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
## 2012-09-05 NOTE — ED Provider Notes (Signed)
Medical screening examination/treatment/procedure(s) were performed by resident physician or non-physician practitioner and as supervising physician I was immediately available for consultation/collaboration.   Benard Minturn DOUGLAS MD.    Viki Carrera D Tegan Britain, MD 09/05/12 1518 

## 2012-09-25 ENCOUNTER — Telehealth: Payer: Self-pay

## 2012-09-25 NOTE — Telephone Encounter (Signed)
Pt needs to talk with someone regarding her blood pressure States she has been playing phone tag Please call pt at [phone]

## 2012-09-26 NOTE — Telephone Encounter (Signed)
The best thing to do would be for her to come in at her earliest convenience so she can discuss with a provider.

## 2012-09-26 NOTE — Telephone Encounter (Signed)
I spoke to patient she states her blood pressure has been elevated 130/107. I advised her she was due for lab work in November, she states she has been putting this off, because she is in school and working full time. She states she can try to come in on Friday or the weekend, but wants me to ask if we can do anything for her blood pressure before then, she currently takes Lisinopril 20mg .

## 2012-09-27 NOTE — Telephone Encounter (Signed)
Spoke with pt, advised to RTC. Pt understood. 

## 2012-09-29 ENCOUNTER — Ambulatory Visit: Payer: Self-pay | Admitting: Physician Assistant

## 2012-09-29 VITALS — BP 142/90 | HR 83 | Temp 98.3°F | Resp 16 | Ht 68.0 in | Wt 218.0 lb

## 2012-09-29 DIAGNOSIS — E782 Mixed hyperlipidemia: Secondary | ICD-10-CM

## 2012-09-29 DIAGNOSIS — E7800 Pure hypercholesterolemia, unspecified: Secondary | ICD-10-CM

## 2012-09-29 DIAGNOSIS — I10 Essential (primary) hypertension: Secondary | ICD-10-CM

## 2012-09-29 DIAGNOSIS — Z79899 Other long term (current) drug therapy: Secondary | ICD-10-CM

## 2012-09-29 DIAGNOSIS — R7309 Other abnormal glucose: Secondary | ICD-10-CM

## 2012-09-29 LAB — COMPREHENSIVE METABOLIC PANEL
ALT: 16 U/L (ref 0–35)
AST: 19 U/L (ref 0–37)
Albumin: 4.1 g/dL (ref 3.5–5.2)
CO2: 24 mEq/L (ref 19–32)
Calcium: 8.9 mg/dL (ref 8.4–10.5)
Chloride: 104 mEq/L (ref 96–112)
Potassium: 3.9 mEq/L (ref 3.5–5.3)
Sodium: 135 mEq/L (ref 135–145)
Total Protein: 6.7 g/dL (ref 6.0–8.3)

## 2012-09-29 LAB — POCT CBC
MCH, POC: 26.9 pg — AB (ref 27–31.2)
MCHC: 30.7 g/dL — AB (ref 31.8–35.4)
MID (cbc): 0.7 (ref 0–0.9)
MPV: 10.3 fL (ref 0–99.8)
POC LYMPH PERCENT: 24.2 %L (ref 10–50)
POC MID %: 10.4 %M (ref 0–12)
Platelet Count, POC: 247 10*3/uL (ref 142–424)
RBC: 4.84 M/uL (ref 4.04–5.48)
RDW, POC: 14.9 %
WBC: 6.3 10*3/uL (ref 4.6–10.2)

## 2012-09-29 LAB — LIPID PANEL
LDL Cholesterol: 134 mg/dL — ABNORMAL HIGH (ref 0–99)
Triglycerides: 149 mg/dL (ref <150)

## 2012-09-29 MED ORDER — CLONAZEPAM 0.5 MG PO TABS: ORAL_TABLET | ORAL | Status: DC

## 2012-09-29 MED ORDER — CITALOPRAM HYDROBROMIDE 40 MG PO TABS: 40.0000 mg | ORAL_TABLET | Freq: Every day | ORAL | Status: DC

## 2012-09-29 MED ORDER — PRAVASTATIN SODIUM 40 MG PO TABS: 40.0000 mg | ORAL_TABLET | Freq: Every day | ORAL | Status: DC

## 2012-09-29 MED ORDER — LISINOPRIL-HYDROCHLOROTHIAZIDE 20-25 MG PO TABS: 1.0000 | ORAL_TABLET | Freq: Every day | ORAL | Status: DC

## 2012-09-29 MED ORDER — FENOFIBRATE 54 MG PO TABS: 54.0000 mg | ORAL_TABLET | Freq: Every day | ORAL | Status: DC

## 2012-09-29 NOTE — Progress Notes (Signed)
  Subjective:    Patient ID: Connie Wilson, female    DOB: Jan 31, 1956, 56 y.o.   MRN: [id number]  HPI 56 yr old CF presents with multiple issues.  She is overdue for bloodwork.  She had elevated glucose in august. She also had elevated lipids and was started on pravachol.  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 She is on plain lisinopril, but she has been on the combo with HCTZ before and has done well on it.    She wants to switch from sertraline to a cheaper antidepressant.  She has been on citalopram and paxil before.  She doesn't remember having problems with either. She doesn't like prozac.  She is taking clonazepam 3 tablets daily.  She hasn't been exercising or eating healthy.  She is vegetarian.  She eats a lot of breads, pastas, and convenience foods.  Review of Systems  All other systems reviewed and are negative.       Objective:   Physical Exam  Nursing note and vitals reviewed. Constitutional: She is oriented to person, place, and time. She appears well-developed and well-nourished.  HENT:  Head: Normocephalic and atraumatic.  Neck: Normal range of motion. No thyromegaly present.  Cardiovascular: Normal rate, regular rhythm and normal heart sounds.   Pulmonary/Chest: Effort normal and breath sounds normal.  Neurological: She is alert and oriented to person, place, and time.  Skin: Skin is warm and dry.  Psychiatric: She has a normal mood and affect. Her behavior is normal.      Assessment & Plan:  Hypertension-suboptimal control.  Will d/c plain lisinopril and start lisinopril/hctz 20/25. Hyperlipidemia-checking status. Continue meds for now. Anxiety/depression- stable on sertraline, but patient wishes to change secondary to cost concerns.  She can stop the sertraline after she finishes her current prescription and start citalopram. Ok to continue clonazepam.

## 2012-10-10 DIAGNOSIS — Z0271 Encounter for disability determination: Secondary | ICD-10-CM

## 2012-11-14 ENCOUNTER — Telehealth: Payer: Self-pay | Admitting: *Deleted

## 2012-11-14 NOTE — Telephone Encounter (Signed)
Pt called about concerns with her blood pressure being up.  She states that it usually runs high when she is at school.  Today she had a reading of 141/95 and pulse of 85.  She wanted to know if she needed to make any adjustments to her meds or maybe keep a log for at least a week (i suggested this since she has not been logging bps at home).

## 2012-11-14 NOTE — Telephone Encounter (Signed)
Agree with having patient keep a log and forward that info to [place] who saw her for this in December. We cannot make an adjustment over an isolated reading over the phone.

## 2012-11-14 NOTE — Telephone Encounter (Signed)
Pt agreed with keeping a log and will call back in a week with readings

## 2013-01-15 ENCOUNTER — Ambulatory Visit: Payer: Self-pay | Admitting: Family Medicine

## 2013-01-15 VITALS — BP 112/76 | HR 74 | Temp 97.4°F | Resp 16 | Ht 68.5 in | Wt 205.0 lb

## 2013-01-15 DIAGNOSIS — F4010 Social phobia, unspecified: Secondary | ICD-10-CM

## 2013-01-15 DIAGNOSIS — E785 Hyperlipidemia, unspecified: Secondary | ICD-10-CM

## 2013-01-15 DIAGNOSIS — R7309 Other abnormal glucose: Secondary | ICD-10-CM

## 2013-01-15 DIAGNOSIS — I10 Essential (primary) hypertension: Secondary | ICD-10-CM

## 2013-01-15 MED ORDER — CITALOPRAM HYDROBROMIDE 40 MG PO TABS: 40.0000 mg | ORAL_TABLET | Freq: Every day | ORAL | Status: DC

## 2013-01-15 MED ORDER — FENOFIBRATE 54 MG PO TABS: 54.0000 mg | ORAL_TABLET | Freq: Every day | ORAL | Status: DC

## 2013-01-15 MED ORDER — LORATADINE 10 MG PO TABS: 10.0000 mg | ORAL_TABLET | Freq: Every day | ORAL | Status: DC

## 2013-01-15 MED ORDER — LISINOPRIL-HYDROCHLOROTHIAZIDE 20-25 MG PO TABS: 1.0000 | ORAL_TABLET | Freq: Every day | ORAL | Status: DC

## 2013-01-15 MED ORDER — PRAVASTATIN SODIUM 40 MG PO TABS: 40.0000 mg | ORAL_TABLET | Freq: Every day | ORAL | Status: DC

## 2013-01-15 MED ORDER — CLONAZEPAM 0.5 MG PO TABS: ORAL_TABLET | ORAL | Status: DC

## 2013-01-15 NOTE — Progress Notes (Signed)
Subjective:    Patient ID: Connie Wilson, female    DOB: [birth year], 57 y.o.   MRN: [id number]   Chief Complaint  Patient presents with  . Medication Refill  . Allergies    2 weeks    HPI  Req a 90 d refill on all medicaitons.   Has had the standard allergies of itchy eyes, stuffy nose, scratchy throat.  And she blew her nose so hard she got very dizzy and now not hearing out of right ear as well.  She kept feeling ears pop.  Now nose not running but still coughing.  Coughing more when laying down and when she gets hot.  Was taking mucinex sinus max but doesn't know what she can use for this as she has HTN and the coricidin products don't work for her. She is hoping to start a new job next wk at [employer] and worried she will be let go if they think she is sick right away. She has a lot of anxiety in social situations, esp in groups. She states the switch from zoloft to citalopram seems to be uneventful. She is using the klonopin as needed tid - anywhere from 1/2 tab occasionally to tid.   We called [facility] where she gets her meds refilled and ran pt through the [facility] and she last got 90 tabs of klonopin in August from Dr. Katrinka Blazing. [facility] pharmacy states she never picked up the clonazepam that Marylene Land prescribed to her Dec.  Past Medical History  Diagnosis Date  . Hyperlipidemia   . Hypertension   . Anxiety   . Depression    Current Outpatient Prescriptions on File Prior to Visit  Medication Sig Dispense Refill  . Ascorbic Acid (VITAMIN C) 1000 MG tablet Take 1,000 mg by mouth daily.      . calcium & magnesium carbonates (MYLANTA) 311-232 MG per tablet Take 1 tablet by mouth daily.      . calcium citrate (CALCITRATE - DOSED IN MG ELEMENTAL CALCIUM) 950 MG tablet Take 1 tablet by mouth 2 (two) times daily.      . fish oil-omega-3 fatty acids 1000 MG capsule Take 1 g by mouth 2 (two) times daily.       . Multiple Vitamin (MULTIVITAMIN) tablet Take 1 tablet by mouth daily.      .  clotrimazole (LOTRIMIN) 1 % cream Apply topically 2 (two) times daily.      . promethazine (PHENERGAN) 25 MG tablet Take 1 tablet (25 mg total) by mouth every 8 (eight) hours as needed for nausea.  20 tablet  0   No current facility-administered medications on file prior to visit.   Allergies  Allergen Reactions  . Penicillins Rash   Review of Systems  Constitutional: Positive for fatigue. Negative for fever, chills, diaphoresis, activity change and appetite change.  HENT: Positive for hearing loss, ear pain, congestion, sore throat, rhinorrhea, sneezing, postnasal drip and sinus pressure. Negative for nosebleeds, trouble swallowing, neck pain, neck stiffness, voice change, tinnitus and ear discharge.   Eyes: Positive for itching. Negative for discharge and visual disturbance.  Respiratory: Positive for cough. Negative for shortness of breath.   Cardiovascular: Negative for chest pain, palpitations and leg swelling.  Gastrointestinal: Negative for nausea, vomiting and abdominal pain.  Genitourinary: Negative for decreased urine volume.  Skin: Negative for rash.  Neurological: Negative for dizziness, syncope and headaches.  Hematological: Negative for adenopathy. Does not bruise/bleed easily.  Psychiatric/Behavioral: Negative for sleep disturbance.  BP 112/76  Pulse 74  Temp(Src) 97.4 F (36.3 C) (Oral)  Resp 16  Ht 5' 8.5" (1.74 m)  Wt 205 lb (92.987 kg)  BMI 30.71 kg/m2  SpO2 98% Objective:   Physical Exam  Constitutional: She is oriented to person, place, and time. She appears well-developed and well-nourished. She does not appear ill. No distress.  HENT:  Head: Normocephalic and atraumatic.  Right Ear: Tympanic membrane, external ear and ear canal normal.  Left Ear: Tympanic membrane, external ear and ear canal normal.  Nose: Mucosal edema and rhinorrhea present.  Mouth/Throat: Uvula is midline and mucous membranes are normal. No oropharyngeal exudate, posterior  oropharyngeal edema, posterior oropharyngeal erythema or tonsillar abscesses.  Right TM partially obscured by wax  Eyes: Conjunctivae are normal. Right eye exhibits no discharge. Left eye exhibits no discharge. No scleral icterus.  Neck: Normal range of motion. Neck supple.  Cardiovascular: Normal rate, regular rhythm, normal heart sounds and intact distal pulses.   Pulmonary/Chest: Effort normal and breath sounds normal.  Lymphadenopathy:       Head (right side): No preauricular and no posterior auricular adenopathy present.       Head (left side): No submandibular, no preauricular and no posterior auricular adenopathy present.    She has no cervical adenopathy.       Right: No supraclavicular adenopathy present.       Left: No supraclavicular adenopathy present.  Neurological: She is alert and oriented to person, place, and time.  Skin: Skin is warm and dry. She is not diaphoretic. No erythema.  Psychiatric: She has a normal mood and affect. Her behavior is normal.      Assessment & Plan:  Hyperlipidemia - recheck flp at f/u.  HTN (hypertension) - veyr well controlled - improved w/ weightloss.  Elevated glucose - a1c 5.8 in Oct. Try to decrease carbs and recheck at f/u  Social anxiety disorder - cont celexa and klonopin which she is using prn - by her hx 90 tabs should last her far longer than 6 mos.  She needs to bring in her bottle at f/u so we can see how much she has left.  HM - overdue for CPP - sched at f/u after she gets insurance.  Meds ordered this encounter  Medications  . DISCONTD: sertraline (ZOLOFT) 50 MG tablet    Sig: Take 50 mg by mouth 3 (three) times daily.  . citalopram (CELEXA) 40 MG tablet    Sig: Take 1 tablet (40 mg total) by mouth daily.    Dispense:  90 tablet    Refill:  1    Order Specific Question:  Supervising Provider    Answer:  DOOLITTLE, ROBERT P [[id number]]  . pravastatin (PRAVACHOL) 40 MG tablet    Sig: Take 1 tablet (40 mg total) by mouth daily.     Dispense:  90 tablet    Refill:  1    Order Specific Question:  Supervising Provider    Answer:  DOOLITTLE, ROBERT P [[id number]]  . lisinopril-hydrochlorothiazide (PRINZIDE,ZESTORETIC) 20-25 MG per tablet    Sig: Take 1 tablet by mouth daily.    Dispense:  90 tablet    Refill:  1    Order Specific Question:  Supervising Provider    Answer:  DOOLITTLE, ROBERT P [[id number]]  . fenofibrate 54 MG tablet    Sig: Take 1 tablet (54 mg total) by mouth daily.    Dispense:  90 tablet    Refill:  1    Order  Specific Question:  Supervising Provider    Answer:  DOOLITTLE, ROBERT P [[id number]]  . clonazePAM (KLONOPIN) 0.5 MG tablet    Sig: Take one in the am, two at night as needed    Dispense:  90 tablet    Refill:  0    Order Specific Question:  Supervising Provider    Answer:  DOOLITTLE, ROBERT P [[id number]]  . loratadine (CLARITIN) 10 MG tablet    Sig: Take 1 tablet (10 mg total) by mouth daily.    Dispense:  30 tablet    Refill:  5

## 2013-01-15 NOTE — Patient Instructions (Addendum)
Do not take any otc medications - coricidin products or mucinex are fine - do not take products with phenylephrine or pseudoephedrine.   DASH Diet The DASH diet stands for "Dietary Approaches to Stop Hypertension." It is a healthy eating plan that has been shown to reduce high blood pressure (hypertension) in as little as 14 days, while also possibly providing other significant health benefits. These other health benefits include reducing the risk of breast cancer after menopause and reducing the risk of type 2 diabetes, heart disease, colon cancer, and stroke. Health benefits also include weight loss and slowing kidney failure in patients with chronic kidney disease.  DIET GUIDELINES  Limit salt (sodium). Your diet should contain less than 1500 mg of sodium daily.  Limit refined or processed carbohydrates. Your diet should include mostly whole grains. Desserts and added sugars should be used sparingly.  Include small amounts of heart-healthy fats. These types of fats include nuts, oils, and tub margarine. Limit saturated and trans fats. These fats have been shown to be harmful in the body. CHOOSING FOODS  The following food groups are based on a 2000 calorie diet. See your Registered Dietitian for individual calorie needs. Grains and Grain Products (6 to 8 servings daily)  Eat More Often: Whole-wheat bread, brown rice, whole-grain or wheat pasta, quinoa, popcorn without added fat or salt (air popped).  Eat Less Often: White bread, white pasta, white rice, cornbread. Vegetables (4 to 5 servings daily)  Eat More Often: Fresh, frozen, and canned vegetables. Vegetables may be raw, steamed, roasted, or grilled with a minimal amount of fat.  Eat Less Often/Avoid: Creamed or fried vegetables. Vegetables in a cheese sauce. Fruit (4 to 5 servings daily)  Eat More Often: All fresh, canned (in natural juice), or frozen fruits. Dried fruits without added sugar. One hundred percent fruit juice ( cup  [237 mL] daily).  Eat Less Often: Dried fruits with added sugar. Canned fruit in light or heavy syrup. [employer], Fish, and Poultry (2 servings or less daily. One serving is 3 to 4 oz [85-114 g]).  Eat More Often: Ninety percent or leaner ground beef, tenderloin, sirloin. Round cuts of beef, chicken breast, [place] breast. All fish. Grill, bake, or broil your meat. Nothing should be fried.  Eat Less Often/Avoid: Fatty cuts of meat, [place], or chicken leg, thigh, or wing. Fried cuts of meat or fish. Dairy (2 to 3 servings)  Eat More Often: Low-fat or fat-free milk, low-fat plain or light yogurt, reduced-fat or part-skim cheese.  Eat Less Often/Avoid: Milk (whole, 2%).Whole milk yogurt. Full-fat cheeses. Nuts, Seeds, and Legumes (4 to 5 servings per week)  Eat More Often: All without added salt.  Eat Less Often/Avoid: Salted nuts and seeds, canned beans with added salt. Fats and Sweets (limited)  Eat More Often: Vegetable oils, tub margarines without trans fats, sugar-free gelatin. Mayonnaise and salad dressings.  Eat Less Often/Avoid: Coconut oils, palm oils, butter, stick margarine, cream, half and half, cookies, candy, pie. FOR MORE INFORMATION The Dash Diet Eating Plan: www.dashdiet.org Document Released: 09/15/2011 Document Revised: 12/19/2011 Document Reviewed: 09/15/2011 [facility] Patient Information 2013 [place], [place].

## 2013-02-23 ENCOUNTER — Ambulatory Visit: Payer: BC Managed Care – PPO

## 2013-02-23 ENCOUNTER — Encounter (HOSPITAL_COMMUNITY): Payer: Self-pay

## 2013-02-23 ENCOUNTER — Ambulatory Visit (INDEPENDENT_AMBULATORY_CARE_PROVIDER_SITE_OTHER): Payer: BC Managed Care – PPO | Admitting: Emergency Medicine

## 2013-02-23 ENCOUNTER — Ambulatory Visit (HOSPITAL_COMMUNITY)
Admission: RE | Admit: 2013-02-23 | Discharge: 2013-02-23 | Disposition: A | Discharge status: Home / Self Care | Payer: BC Managed Care – PPO | Source: Ambulatory Visit | Attending: Emergency Medicine | Admitting: Emergency Medicine

## 2013-02-23 VITALS — BP 128/82 | HR 77 | Temp 98.2°F | Resp 17 | Ht 69.0 in | Wt 203.0 lb

## 2013-02-23 DIAGNOSIS — K5730 Diverticulosis of large intestine without perforation or abscess without bleeding: Secondary | ICD-10-CM | POA: Insufficient documentation

## 2013-02-23 DIAGNOSIS — R141 Gas pain: Secondary | ICD-10-CM | POA: Insufficient documentation

## 2013-02-23 DIAGNOSIS — R82998 Other abnormal findings in urine: Secondary | ICD-10-CM

## 2013-02-23 DIAGNOSIS — K5900 Constipation, unspecified: Secondary | ICD-10-CM | POA: Insufficient documentation

## 2013-02-23 DIAGNOSIS — R142 Eructation: Secondary | ICD-10-CM | POA: Insufficient documentation

## 2013-02-23 DIAGNOSIS — R109 Unspecified abdominal pain: Secondary | ICD-10-CM

## 2013-02-23 DIAGNOSIS — R8281 Pyuria: Secondary | ICD-10-CM

## 2013-02-23 DIAGNOSIS — D72829 Elevated white blood cell count, unspecified: Secondary | ICD-10-CM

## 2013-02-23 DIAGNOSIS — N854 Malposition of uterus: Secondary | ICD-10-CM | POA: Insufficient documentation

## 2013-02-23 DIAGNOSIS — E119 Type 2 diabetes mellitus without complications: Secondary | ICD-10-CM

## 2013-02-23 DIAGNOSIS — R143 Flatulence: Secondary | ICD-10-CM | POA: Insufficient documentation

## 2013-02-23 LAB — POCT CBC
Granulocyte percent: 64.6 %G (ref 37–80)
Hemoglobin: 14.1 g/dL (ref 12.2–16.2)
MCH, POC: 29.1 pg (ref 27–31.2)
MCV: 90.9 fL (ref 80–97)
RBC: 4.85 M/uL (ref 4.04–5.48)
WBC: 11.1 10*3/uL — AB (ref 4.6–10.2)

## 2013-02-23 LAB — GLUCOSE, POCT (MANUAL RESULT ENTRY): POC Glucose: 75 mg/dl (ref 70–99)

## 2013-02-23 LAB — POCT UA - MICROSCOPIC ONLY: Crystals, Ur, HPF, POC: NEGATIVE

## 2013-02-23 LAB — POCT URINALYSIS DIPSTICK
Blood, UA: NEGATIVE
Glucose, UA: NEGATIVE
Protein, UA: NEGATIVE
Spec Grav, UA: >=1.03
Urobilinogen, UA: 0.2

## 2013-02-23 MED ORDER — IOHEXOL 300 MG/ML  SOLN
50.0000 mL | Freq: Once | INTRAMUSCULAR | Status: AC | PRN
  Administered 2013-02-23: 50 mL via ORAL

## 2013-02-23 MED ORDER — IOHEXOL 300 MG/ML  SOLN
100.0000 mL | Freq: Once | INTRAMUSCULAR | Status: AC | PRN
  Administered 2013-02-23: 100 mL via INTRAVENOUS

## 2013-02-23 NOTE — Progress Notes (Signed)
Subjective:    Patient ID: Connie Wilson, female    DOB: 18-Mar-1956, 57 y.o.   MRN: [id number]  HPI  57 year old female presents with stomach pain and bloating.  Unable to have bowel movement.  Had a colonoscopy at 50.  Father died from colon cancer.  If she has a bowel movement it is only 3 small stools.  Has a bowel movement 2 to 3 times a day.  Has not had one in the last 24 hours.  She is a CNA taking care of people.  Having stomach pain and getting nauseated.  No urinary symptoms at present    Review of Systems     Objective:   Physical Exam patient is alert and cooperative not ill-appearing her neck is supple. Chest is clear to auscultation and percussion cardiac exam reveals a regular rate no murmurs. The abdomen is flat there are bowel sounds present. There is a healed scar around the umbilicus. There is diffuse tenderness in the lower abdomen on the right and left side as well as suprapubic area. [facility] reading (PRIMARY) by  Dr. Cleta Alberts is a good amount of stool there is no evidence of obstruction there is no free air Results for orders placed in visit on 02/23/13  POCT CBC      Result Value Range   WBC 11.1 (*) 4.6 - 10.2 K/uL   Lymph, poc 3.2  0.6 - 3.4   POC LYMPH PERCENT 29.2  10 - 50 %L   MID (cbc) 0.7  0 - 0.9   POC MID % 6.2  0 - 12 %M   POC Granulocyte 7.2 (*) 2 - 6.9   Granulocyte percent 64.6  37 - 80 %G   RBC 4.85  4.04 - 5.48 M/uL   Hemoglobin 14.1  12.2 - 16.2 g/dL   HCT, POC 40.9  81.1 - 47.9 %   MCV 90.9  80 - 97 fL   MCH, POC 29.1  27 - 31.2 pg   MCHC 32.0  31.8 - 35.4 g/dL   RDW, POC 91.4     Platelet Count, POC 262  142 - 424 K/uL   MPV 12.1  0 - 99.8 fL  POCT UA - MICROSCOPIC ONLY      Result Value Range   WBC, Ur, HPF, POC 4-13     RBC, urine, microscopic 0-3     Bacteria, U Microscopic 4+     Mucus, UA large     Epithelial cells, urine per micros neg     Crystals, Ur, HPF, POC neg     Casts, Ur, LPF, POC waxy     Yeast, UA pos    POCT URINALYSIS  DIPSTICK      Result Value Range   Color, UA yellow     Clarity, UA clear     Glucose, UA neg     Bilirubin, UA neg     Ketones, UA trace     Spec Grav, UA >=1.030     Blood, UA neg     pH, UA 5.5     Protein, UA neg     Urobilinogen, UA 0.2     Nitrite, UA neg     Leukocytes, UA Trace     [facility] reading (PRIMARY) by  Dr.Ramces Shomaker no fractures seen         Assessment & Plan:  We'll start with a CBC urine ketones abdominal series. Will refer back to GI because she does have a  family history colon cancer. Referral made to gastroenterology. Advise her to take MiraLax as needed for constipation her CT report showed only diverticulosis. She needs to be on either Citrucel or Metamucil daily.

## 2013-02-24 LAB — URINE CULTURE: Colony Count: 45000

## 2013-03-08 ENCOUNTER — Telehealth: Payer: Self-pay

## 2013-03-08 NOTE — Telephone Encounter (Signed)
Called her left detailed message for her, and advised her to call me back

## 2013-03-08 NOTE — Telephone Encounter (Signed)
What is she requesting? Sertraline 50mg  tid

## 2013-03-08 NOTE — Telephone Encounter (Signed)
Zoloft was dc'ed in 09/2012 and changed to Celexa. She should not be taking these together. Which one would she like to continue?

## 2013-03-08 NOTE — Telephone Encounter (Signed)
Pt states that she called her pharmacy for a refill and when she called back after they contacted [place] they said that we stated that we was unaware of her taking this medication She states that she has been taking this certain medication for years Call back number is [phone] Pharmacy is [facility]

## 2013-03-08 NOTE — Telephone Encounter (Signed)
Advised pt of notes from ov from Dec ov that she was changed to Celexa.  Pt will call pharmacy to get rx for celexa.

## 2013-03-31 ENCOUNTER — Telehealth: Payer: Self-pay

## 2013-03-31 NOTE — Telephone Encounter (Signed)
Patient has concerns about side effects from her generic celexa.  She is having muscular pain, headaches and sick to her stomach. She tried heat, ice, and tylenol. Please call back at [phone]. She works third shift so she would like a call back quickly in case she goes to sleep.

## 2013-04-03 NOTE — Telephone Encounter (Signed)
She does not want to go back on the Zoloft, she has d/c the Celexa. Please advise, she needs generic (cost effective)

## 2013-04-03 NOTE — Telephone Encounter (Signed)
I've reviewed her chart.  She's been on citalopram (celexa) since 09/2012 (and had been on it before as well).  Did she start having adverse effects of muscle pain, HA and sick to her stomach then?  If the symptoms were more recent, it's unlikely they were caused by the citalopram, and she may want to consider restarting it.  In 09/2012, when she was switched from sertraline to citalopram (because she needed something less costly), the note indicates she'd also tried paxil (paroxetine) before.  It's also on the "$4 List" at [facility], and would be another good alternative.

## 2013-04-03 NOTE — Telephone Encounter (Signed)
Called patient left message for her to call me back

## 2013-04-03 NOTE — Telephone Encounter (Signed)
It looks like she was on Zoloft before. Does she want to switch back to this or try something different? She should stop the Celexa if she is having these bad side effects but since she is on 40 mg may need to taper to 1/2 for 1 week then stop.

## 2013-04-04 MED ORDER — PAROXETINE HCL 40 MG PO TABS: 40.0000 mg | ORAL_TABLET | ORAL | Status: DC

## 2013-04-04 NOTE — Telephone Encounter (Signed)
Pt would like to increase her clonazepam back to 3 a day "before she does something crazy" she stated. Best# [phone]

## 2013-04-04 NOTE — Telephone Encounter (Signed)
Sent Paxil to pharmacy.  Pt will need to discuss increased benzo dose with the provider that prescribes that medication for her

## 2013-04-04 NOTE — Telephone Encounter (Signed)
Thanks. She would like to try the Paxil. She does not want to go back on Celexa or Zoloft

## 2013-04-06 ENCOUNTER — Emergency Department (HOSPITAL_COMMUNITY)
Admission: EM | Admit: 2013-04-06 | Discharge: 2013-04-06 | Disposition: A | Discharge status: Home / Self Care | Payer: BC Managed Care – PPO | Attending: Emergency Medicine | Admitting: Emergency Medicine

## 2013-04-06 ENCOUNTER — Encounter (HOSPITAL_COMMUNITY): Payer: Self-pay

## 2013-04-06 DIAGNOSIS — F411 Generalized anxiety disorder: Secondary | ICD-10-CM | POA: Insufficient documentation

## 2013-04-06 DIAGNOSIS — F3289 Other specified depressive episodes: Secondary | ICD-10-CM | POA: Insufficient documentation

## 2013-04-06 DIAGNOSIS — Z760 Encounter for issue of repeat prescription: Secondary | ICD-10-CM

## 2013-04-06 DIAGNOSIS — Z8739 Personal history of other diseases of the musculoskeletal system and connective tissue: Secondary | ICD-10-CM | POA: Insufficient documentation

## 2013-04-06 DIAGNOSIS — R109 Unspecified abdominal pain: Secondary | ICD-10-CM | POA: Insufficient documentation

## 2013-04-06 DIAGNOSIS — Z79899 Other long term (current) drug therapy: Secondary | ICD-10-CM | POA: Insufficient documentation

## 2013-04-06 DIAGNOSIS — Z8639 Personal history of other endocrine, nutritional and metabolic disease: Secondary | ICD-10-CM | POA: Insufficient documentation

## 2013-04-06 DIAGNOSIS — Z862 Personal history of diseases of the blood and blood-forming organs and certain disorders involving the immune mechanism: Secondary | ICD-10-CM | POA: Insufficient documentation

## 2013-04-06 DIAGNOSIS — Z880 Allergy status to penicillin: Secondary | ICD-10-CM | POA: Insufficient documentation

## 2013-04-06 DIAGNOSIS — F329 Major depressive disorder, single episode, unspecified: Secondary | ICD-10-CM | POA: Insufficient documentation

## 2013-04-06 DIAGNOSIS — I10 Essential (primary) hypertension: Secondary | ICD-10-CM | POA: Insufficient documentation

## 2013-04-06 LAB — COMPREHENSIVE METABOLIC PANEL
ALT: 18 U/L (ref 0–35)
AST: 21 U/L (ref 0–37)
Albumin: 3.6 g/dL (ref 3.5–5.2)
Alkaline Phosphatase: 69 U/L (ref 39–117)
CO2: 28 mEq/L (ref 19–32)
Chloride: 101 mEq/L (ref 96–112)
Creatinine, Ser: 0.92 mg/dL (ref 0.50–1.10)
GFR calc non Af Amer: 68 mL/min — ABNORMAL LOW (ref >90)
Potassium: 3.6 mEq/L (ref 3.5–5.1)
Sodium: 137 mEq/L (ref 135–145)
Total Bilirubin: 0.3 mg/dL (ref 0.3–1.2)

## 2013-04-06 LAB — CBC WITH DIFFERENTIAL/PLATELET
Basophils Absolute: 0 10*3/uL (ref 0.0–0.1)
Basophils Relative: 1 % (ref 0–1)
HCT: 37.6 % (ref 36.0–46.0)
Lymphocytes Relative: 33 % (ref 12–46)
MCHC: 33.5 g/dL (ref 30.0–36.0)
Neutro Abs: 4.1 10*3/uL (ref 1.7–7.7)
Neutrophils Relative %: 58 % (ref 43–77)
RDW: 13.1 % (ref 11.5–15.5)
WBC: 7 10*3/uL (ref 4.0–10.5)

## 2013-04-06 LAB — RAPID URINE DRUG SCREEN, HOSP PERFORMED
Amphetamines: NOT DETECTED
Barbiturates: NOT DETECTED
Opiates: NOT DETECTED
Tetrahydrocannabinol: NOT DETECTED

## 2013-04-06 MED ORDER — FENOFIBRATE 54 MG PO TABS
54.0000 mg | ORAL_TABLET | Freq: Every day | ORAL | Status: DC
  Filled 2013-04-06: qty 1

## 2013-04-06 MED ORDER — LORAZEPAM 1 MG PO TABS: 1.0000 mg | ORAL_TABLET | Freq: Three times a day (TID) | ORAL | Status: DC | PRN

## 2013-04-06 MED ORDER — SIMVASTATIN 20 MG PO TABS
20.0000 mg | ORAL_TABLET | Freq: Every day | ORAL | Status: DC
  Filled 2013-04-06: qty 1

## 2013-04-06 MED ORDER — IBUPROFEN 200 MG PO TABS: 600.0000 mg | ORAL_TABLET | Freq: Three times a day (TID) | ORAL | Status: DC | PRN

## 2013-04-06 MED ORDER — ONDANSETRON HCL 4 MG PO TABS: 4.0000 mg | ORAL_TABLET | Freq: Three times a day (TID) | ORAL | Status: DC | PRN

## 2013-04-06 MED ORDER — ACETAMINOPHEN 325 MG PO TABS: 650.0000 mg | ORAL_TABLET | ORAL | Status: DC | PRN

## 2013-04-06 MED ORDER — ALUM & MAG HYDROXIDE-SIMETH 200-200-20 MG/5ML PO SUSP: 30.0000 mL | ORAL | Status: DC | PRN

## 2013-04-06 MED ORDER — ZOLPIDEM TARTRATE 5 MG PO TABS: 5.0000 mg | ORAL_TABLET | Freq: Every evening | ORAL | Status: DC | PRN

## 2013-04-06 MED ORDER — CLONAZEPAM 0.5 MG PO TABS: ORAL_TABLET | ORAL | Status: DC

## 2013-04-06 MED ORDER — PAROXETINE HCL 20 MG PO TABS
40.0000 mg | ORAL_TABLET | Freq: Every day | ORAL | Status: DC
  Filled 2013-04-06: qty 2

## 2013-04-06 MED ORDER — HYDROCHLOROTHIAZIDE 25 MG PO TABS: 25.0000 mg | ORAL_TABLET | Freq: Every day | ORAL | Status: DC

## 2013-04-06 MED ORDER — LISINOPRIL 20 MG PO TABS: 20.0000 mg | ORAL_TABLET | Freq: Every day | ORAL | Status: DC

## 2013-04-06 MED ORDER — LISINOPRIL-HYDROCHLOROTHIAZIDE 20-25 MG PO TABS: 1.0000 | ORAL_TABLET | Freq: Every day | ORAL | Status: DC

## 2013-04-06 NOTE — ED Provider Notes (Signed)
Medical screening examination/treatment/procedure(s) were performed by non-physician practitioner and as supervising physician I was immediately available for consultation/collaboration.   Tasean Mancha Y. Euna Armon, MD 04/06/13 1956 

## 2013-04-06 NOTE — ED Notes (Signed)
She states she has had financial issues in obtaining her usual sertraline; so she was switched to citalopram, "but I had to quit that because of the side effects".  She states that now, while she is in hiatus from these psych. Meds, she is having persistent, albeit vague, thoughts about "I don't want to be here".  She has no plan for suicide, and denies h.i.  She is alert, oriented x 4 and in no distress, and is here with her sister.

## 2013-04-06 NOTE — BHH Counselor (Signed)
Pt denies SI and states she said she was suicidal b/c her PCP Urgent [facility] at [facility] was ignoring her request from  one week ago to change her psych meds. Pt said she had been taking Certraline which was effective but too expensive. She was then switched to Celexa which gave her HA and abdominal pain. She says she just discovered that she has rx for Paxil waiting at [facility]. Pt has no hx of suicide attempts and no weapons at home. Pt lives with daughter, son in law and 39 yo grandson. Pt's sister lives next door. Pt able to contract for safety. Writer gives pt list of psychiatrists as pt desires to start seeing an MD. She says she quit taking Celexa two weeks ago and notices increased irritability. Pt future oriented. 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 Pt denies HI and denies [facility]. No delusions noted. Pt isn't a danger to herself or others. Writer spoke with Fayrene Helper PA-C. Laveda Norman will write pt short course of anti-anxiety meds in order to reduce pt's symptoms until Paxil reaches therapeutic dose.   Evette Cristal, [place] Assessment Counselor

## 2013-04-06 NOTE — ED Provider Notes (Signed)
History  This chart was scribed for non-physician practitioner working with [name]. [name], MD by Greggory Stallion, ED scribe. This patient was seen in room WTR4/WLPT4 and the patient's care was started at 5:42 PM.  CSN: [id number] Arrival date & time 04/06/13  1643   Chief Complaint  Patient presents with  . Mental Health Problem   Patient is a 57 y.o. female presenting with mental health disorder. The history is provided by the patient. No language interpreter was used.  Mental Health Problem Presenting symptoms: suicidal thoughts   Patient accompanied by:  Family member Onset quality:  Gradual Chronicity:  New Context: medication (stopped taking medication)   Context: not alcohol use, not drug abuse, not noncompliant, not recent medication change and not stressful life event   Associated symptoms: abdominal pain     HPI Comments: Connie Wilson is a 57 y.o. female who presents to the Emergency Department complaining of a mental health problem. Pt states she is off of her centraline  for the past few months because she can no longer afford it. Pt states she stopped taking the generic form because of the side effects (HA and abdominal pain). She states she does not want to take Prozac because she knows someone who died from it. Pt states she does not feel like living and is depressed. Pt states she recently lost her job and her home life is not going very well right now. Pt states she is not normally suicidal. She states she does not have a plan. Pt denies HI and self medication. She states she has abdominal pain that started a few months ago.  Past Medical History  Diagnosis Date  . Hyperlipidemia   . Hypertension   . Anxiety   . Depression   . Allergy   . Arthritis    Past Surgical History  Procedure Laterality Date  . Fx. r ankle    . Tubal ligation    . Fracture surgery     Family History  Problem Relation Age of Onset  . Ovarian cancer Mother   . Cancer Mother    ovarian  . Heart failure Father   . Colon cancer Father   . Hypertension Father   . Cancer Father     prostate  . Hypertension Sister   . Hyperlipidemia Sister   . Diabetes Maternal Grandmother   . Heart attack Maternal Grandfather    History  Substance Use Topics  . Smoking status: Never Smoker   . Smokeless tobacco: Not on file  . Alcohol Use: No   OB History   Grav Para Term Preterm Abortions TAB SAB Ect Mult Living                 Review of Systems  Gastrointestinal: Positive for abdominal pain.  Psychiatric/Behavioral: Positive for suicidal ideas.  All other systems reviewed and are negative.    Allergies  Penicillins  Home Medications   Current Outpatient Rx  Name  Route  Sig  Dispense  Refill  . Ascorbic Acid (VITAMIN C) 1000 MG tablet   Oral   Take 1,000 mg by mouth daily.         . calcium & magnesium carbonates (MYLANTA) 409-811 MG per tablet   Oral   Take 1 tablet by mouth daily.         . calcium citrate (CALCITRATE - DOSED IN MG ELEMENTAL CALCIUM) 950 MG tablet   Oral   Take 1 tablet by mouth 2 (two) times  daily.         . clonazePAM (KLONOPIN) 0.5 MG tablet      Take one in the am, two at night as needed   90 tablet   0   . clotrimazole (LOTRIMIN) 1 % cream   Topical   Apply topically 2 (two) times daily.         . fenofibrate 54 MG tablet   Oral   Take 1 tablet (54 mg total) by mouth daily.   90 tablet   1   . fish oil-omega-3 fatty acids 1000 MG capsule   Oral   Take 1 g by mouth 2 (two) times daily.          Marland Kitchen lisinopril-hydrochlorothiazide (PRINZIDE,ZESTORETIC) 20-25 MG per tablet   Oral   Take 1 tablet by mouth daily.   90 tablet   1   . loratadine (CLARITIN) 10 MG tablet   Oral   Take 1 tablet (10 mg total) by mouth daily.   30 tablet   5   . Multiple Vitamin (MULTIVITAMIN) tablet   Oral   Take 1 tablet by mouth daily.         Marland Kitchen PARoxetine (PAXIL) 40 MG tablet   Oral   Take 1 tablet (40 mg total) by  mouth every morning.   30 tablet   2   . pravastatin (PRAVACHOL) 40 MG tablet   Oral   Take 1 tablet (40 mg total) by mouth daily.   90 tablet   1    BP 140/84  Pulse 84  Temp(Src) 97.7 F (36.5 C) (Oral)  Resp 16  SpO2 100%  Physical Exam  Nursing note and vitals reviewed. Constitutional: She is oriented to person, place, and time. She appears well-developed and well-nourished. No distress.  HENT:  Head: Normocephalic and atraumatic.  Mouth/Throat: Oropharynx is clear and moist.  Eyes: Conjunctivae and EOM are normal. Pupils are equal, round, and reactive to light.  Neck: Normal range of motion. Neck supple. No tracheal deviation present.  Cardiovascular: Normal rate.   Pulmonary/Chest: Effort normal. No respiratory distress.  Abdominal: Soft. Bowel sounds are normal. There is tenderness. There is no rebound and no guarding.  Musculoskeletal: Normal range of motion.  Neurological: She is alert and oriented to person, place, and time.  Skin: Skin is warm and dry.  Psychiatric:  Making good eye contact. Good insight. Passive suicidal thought with no HI.     ED Course  Procedures (including critical care time)  DIAGNOSTIC STUDIES: Oxygen Saturation is 100% on RA, normal by my interpretation.    COORDINATION OF CARE: 5:51 PM-Discussed treatment plan which includes consults with other physicians with pt at bedside and pt agreed to plan.   7:38 PM Pt is here requesting for refill of her antidepressant.  She just realized that her antidepressant prescription is available at her pharmacy and will need to get it picked up.  She initially states she has passive suicidal ideation without plan.  Pt then said she mentioned it in order to be seen here in ER, but does not have active suicidal ideation.  I have consulted with [facility] who has seen and evaluate pt.  Pt signed Engineer, manufacturing systems.  Pt is stable for discharge.  She will get her medication prescription through her pharmacy.  Pt  also has hx of anxiety, requesting for a refill of her klonopin since she ran out several months ago.  I will give a short course of it.  I  will also give resources for outpt f/u.  Pt is medically cleared.   Labs Reviewed  COMPREHENSIVE METABOLIC PANEL - Abnormal; Notable for the following:    GFR calc non Af Amer 68 (*)    GFR calc Af Amer 79 (*)    All other components within normal limits  CBC WITH DIFFERENTIAL  ETHANOL  URINE RAPID DRUG SCREEN (HOSP PERFORMED)   No results found. 1. Depression   2. Medication refill     MDM  BP 140/84  Pulse 84  Temp(Src) 97.7 F (36.5 C) (Oral)  Resp 16  SpO2 100%  I have reviewed nursing notes and vital signs.   I reviewed available ER/hospitalization records thought the EMR   I personally performed the services described in this documentation, which was scribed in my presence. The recorded information has been reviewed and is accurate.    Fayrene Helper, PA-C 04/06/13 1942

## 2013-04-14 NOTE — Telephone Encounter (Signed)
OK to refill clonazepam 0.5mg  1 tab po tid prn anxiety. Disp 90, no refills.  As this is prn - even though it was prescribed tid - it was usually lasting her for up to 6 months. If her anxiety symptoms are getting worse and she is needing more medication, she needs to be seen in clinic so we can come up with a better plan to control her symptoms.

## 2013-04-15 MED ORDER — CLONAZEPAM 0.5 MG PO TABS: 0.5000 mg | ORAL_TABLET | Freq: Two times a day (BID) | ORAL | Status: DC | PRN

## 2013-04-15 NOTE — Addendum Note (Signed)
Addended byCaffie Damme on: 04/15/2013 08:19 AM   Modules accepted: Orders

## 2013-04-15 NOTE — Telephone Encounter (Signed)
printed

## 2013-04-19 NOTE — Telephone Encounter (Signed)
Connie Wilson, has this been completed? Pt notified?

## 2013-04-22 NOTE — Telephone Encounter (Signed)
Yes, this is done. Thanks.

## 2013-05-20 DIAGNOSIS — Z0271 Encounter for disability determination: Secondary | ICD-10-CM

## 2013-05-23 ENCOUNTER — Encounter: Payer: Self-pay | Admitting: Gastroenterology

## 2013-06-19 ENCOUNTER — Ambulatory Visit: Payer: BC Managed Care – PPO | Admitting: Gastroenterology

## 2013-07-11 ENCOUNTER — Other Ambulatory Visit: Payer: Self-pay | Admitting: Family Medicine

## 2013-07-30 ENCOUNTER — Telehealth: Payer: Self-pay

## 2013-07-30 NOTE — Telephone Encounter (Signed)
Patient is calling to see when she needs to come back in for blood work [phone]

## 2013-07-30 NOTE — Telephone Encounter (Signed)
Due for office visit now. Called her to advise. Left message for her to advise.

## 2013-07-31 ENCOUNTER — Encounter: Payer: Self-pay | Admitting: Family Medicine

## 2013-07-31 ENCOUNTER — Ambulatory Visit (INDEPENDENT_AMBULATORY_CARE_PROVIDER_SITE_OTHER): Payer: BC Managed Care – PPO | Admitting: Family Medicine

## 2013-07-31 VITALS — BP 125/80 | HR 65 | Temp 97.8°F | Resp 18 | Wt 193.0 lb

## 2013-07-31 DIAGNOSIS — R7302 Impaired glucose tolerance (oral): Secondary | ICD-10-CM

## 2013-07-31 DIAGNOSIS — K5900 Constipation, unspecified: Secondary | ICD-10-CM

## 2013-07-31 DIAGNOSIS — F329 Major depressive disorder, single episode, unspecified: Secondary | ICD-10-CM

## 2013-07-31 DIAGNOSIS — L290 Pruritus ani: Secondary | ICD-10-CM

## 2013-07-31 DIAGNOSIS — E7800 Pure hypercholesterolemia, unspecified: Secondary | ICD-10-CM

## 2013-07-31 DIAGNOSIS — R7309 Other abnormal glucose: Secondary | ICD-10-CM

## 2013-07-31 DIAGNOSIS — I10 Essential (primary) hypertension: Secondary | ICD-10-CM

## 2013-07-31 LAB — COMPREHENSIVE METABOLIC PANEL
ALT: 15 U/L (ref 0–35)
CO2: 29 mEq/L (ref 19–32)
Calcium: 9.6 mg/dL (ref 8.4–10.5)
Chloride: 104 mEq/L (ref 96–112)
Creat: 0.85 mg/dL (ref 0.50–1.10)
Glucose, Bld: 98 mg/dL (ref 70–99)
Total Bilirubin: 0.8 mg/dL (ref 0.3–1.2)

## 2013-07-31 LAB — POCT CBC
HCT, POC: 42.6 % (ref 37.7–47.9)
Hemoglobin: 13.6 g/dL (ref 12.2–16.2)
Lymph, poc: 2.1 (ref 0.6–3.4)
MCH, POC: 29.3 pg (ref 27–31.2)
MCHC: 31.9 g/dL (ref 31.8–35.4)
MCV: 91.8 fL (ref 80–97)
POC LYMPH PERCENT: 30.3 %L (ref 10–50)
RDW, POC: 14 %
WBC: 6.8 10*3/uL (ref 4.6–10.2)

## 2013-07-31 LAB — LIPID PANEL
Cholesterol: 165 mg/dL (ref 0–200)
HDL: 42 mg/dL (ref >39)
Total CHOL/HDL Ratio: 3.9 Ratio

## 2013-07-31 LAB — POCT GLYCOSYLATED HEMOGLOBIN (HGB A1C): Hemoglobin A1C: 5

## 2013-07-31 LAB — TSH: TSH: 0.943 u[IU]/mL (ref 0.350–4.500)

## 2013-07-31 MED ORDER — FENOFIBRATE 54 MG PO TABS: 54.0000 mg | ORAL_TABLET | Freq: Every day | ORAL | Status: DC

## 2013-07-31 MED ORDER — PRAVASTATIN SODIUM 40 MG PO TABS: 40.0000 mg | ORAL_TABLET | Freq: Every day | ORAL | Status: DC

## 2013-07-31 MED ORDER — HYDROCORTISONE 1 % RE CREA: TOPICAL_CREAM | RECTAL | Status: DC

## 2013-07-31 MED ORDER — PAROXETINE HCL 40 MG PO TABS: 40.0000 mg | ORAL_TABLET | ORAL | Status: DC

## 2013-07-31 MED ORDER — CLONAZEPAM 0.5 MG PO TABS: 0.5000 mg | ORAL_TABLET | Freq: Two times a day (BID) | ORAL | Status: DC | PRN

## 2013-07-31 MED ORDER — LISINOPRIL-HYDROCHLOROTHIAZIDE 20-25 MG PO TABS: 1.0000 | ORAL_TABLET | Freq: Every day | ORAL | Status: DC

## 2013-07-31 NOTE — Patient Instructions (Signed)
I will be in touch with the rest of your labs.   Try some docusate sodium (stool softener) each day- take one or two.  Also try some fiber one cereal if you can tolerate it.  If not stick with he fiber gummies  Try witch hazel/ tucks pads on your anal area for itching

## 2013-07-31 NOTE — Progress Notes (Signed)
[facility] [address] [place] [address] [phone]  Date:  07/31/2013   Name:  Connie Wilson   DOB:  July 28, 1956   MRN:  [id number]  PCP:  Tally Due, MD    Chief Complaint: Hyperlipidemia, Hypertension and Anal Itching   History of Present Illness:  Connie Wilson is a 57 y.o. very pleasant female patient who presents with the following:  She needs labs today.  She has also noted anal itching and bleeding- small amounts of blood.  She notes bleeding just when the area is very irritated from scratching or staining to stool.  She uses fiber gummies for constipation.  However she does notice that she continues to have small, hard stools all day.  This has been a problem for a few months.    She did have a colonoscopy at 57 years old.  She was told to follow-up in 5 years.   However she cannot recall where she had her last colonoscopy.  It was somewhere here in [place]  She is fasting today for labs.   Needs refills of her medications today as well Patient Active Problem List   Diagnosis Date Noted  . Elevated glucose 09/29/2012  . HTN (hypertension) 01/06/2012  . Hyperlipidemia 01/06/2012    Past Medical History  Diagnosis Date  . Hyperlipidemia   . Hypertension   . Anxiety   . Depression   . Allergy   . Arthritis     Past Surgical History  Procedure Laterality Date  . Fx. r ankle    . Tubal ligation    . Fracture surgery      History  Substance Use Topics  . Smoking status: Never Smoker   . Smokeless tobacco: Not on file  . Alcohol Use: No    Family History  Problem Relation Age of Onset  . Ovarian cancer Mother   . Cancer Mother     ovarian  . Heart failure Father   . Colon cancer Father   . Hypertension Father   . Cancer Father     prostate  . Hypertension Sister   . Hyperlipidemia Sister   . Diabetes Maternal Grandmother   . Heart attack Maternal Grandfather     Allergies  Allergen Reactions  .  Penicillins Rash    Medication list has been reviewed and updated.  Current Outpatient Prescriptions on File Prior to Visit  Medication Sig Dispense Refill  . Ascorbic Acid (VITAMIN C) 1000 MG tablet Take 1,000 mg by mouth daily.      . calcium citrate (CALCITRATE - DOSED IN MG ELEMENTAL CALCIUM) 950 MG tablet Take 1 tablet by mouth 2 (two) times daily.      . citalopram (CELEXA) 40 MG tablet Take 1 tablet (40 mg total) by mouth daily. PATIENT NEEDS OFFICE VISIT FOR ADDITIONAL REFILLS  30 tablet  0  . clonazePAM (KLONOPIN) 0.5 MG tablet Take 1 tablet (0.5 mg total) by mouth 2 (two) times daily as needed for anxiety.  90 tablet  0  . fenofibrate 54 MG tablet Take 1 tablet (54 mg total) by mouth daily. PATIENT NEEDS OFFICE VISIT FOR ADDITIONAL REFILLS  30 tablet  0  . fish oil-omega-3 fatty acids 1000 MG capsule Take 1 g by mouth 2 (two) times daily.       Marland Kitchen lisinopril-hydrochlorothiazide (PRINZIDE,ZESTORETIC) 20-25 MG per tablet Take 1 tablet by mouth daily. PATIENT NEEDS OFFICE VISIT FOR ADDITIONAL REFILLS  30 tablet  0  .  loratadine (CLARITIN) 10 MG tablet Take 1 tablet (10 mg total) by mouth daily.  30 tablet  5  . Multiple Vitamin (MULTIVITAMIN) tablet Take 1 tablet by mouth daily.      Marland Kitchen PARoxetine (PAXIL) 40 MG tablet Take 1 tablet (40 mg total) by mouth every morning.  30 tablet  2  . pravastatin (PRAVACHOL) 40 MG tablet Take 1 tablet (40 mg total) by mouth daily.  90 tablet  1  . clotrimazole (LOTRIMIN) 1 % cream Apply topically 2 (two) times daily.       No current facility-administered medications on file prior to visit.    Review of Systems:  As per HPI- otherwise negative.   Physical Examination: Filed Vitals:   07/31/13 1003  BP: 125/80  Pulse: 65  Temp: 97.8 F (36.6 C)  Resp: 18   Filed Vitals:   07/31/13 1003  Weight: 193 lb (87.544 kg)   Body mass index is 28.49 kg/(m^2). Ideal Body Weight:    GEN: WDWN, NAD, Non-toxic, A & O x 3, overweight HEENT:  Atraumatic, Normocephalic. Neck supple. No masses, No LAD. Ears and Nose: No external deformity. CV: RRR, No M/G/R. No JVD. No thrill. No extra heart sounds. PULM: CTA B, no wheezes, crackles, rhonchi. No retractions. No resp. distress. No accessory muscle use. ABD: S, NT, ND, +BS. No rebound. No HSM. EXTR: No c/c/e NEURO Normal gait.  PSYCH: Normally interactive. Conversant. Not depressed or anxious appearing.  Calm demeanor.  Rectal: unremarkable exam.  No evidence of acute bleeding or fissure.     Results for orders placed in visit on 07/31/13  POCT CBC      Result Value Range   WBC 6.8  4.6 - 10.2 K/uL   Lymph, poc 2.1  0.6 - 3.4   POC LYMPH PERCENT 30.3  10 - 50 %L   MID (cbc) 0.4  0 - 0.9   POC MID % 5.5  0 - 12 %M   POC Granulocyte 4.4  2 - 6.9   Granulocyte percent 64.2  37 - 80 %G   RBC 4.64  4.04 - 5.48 M/uL   Hemoglobin 13.6  12.2 - 16.2 g/dL   HCT, POC 16.1  09.6 - 47.9 %   MCV 91.8  80 - 97 fL   MCH, POC 29.3  27 - 31.2 pg   MCHC 31.9  31.8 - 35.4 g/dL   RDW, POC 04.5     Platelet Count, POC 209  142 - 424 K/uL   MPV 12.6  0 - 99.8 fL  POCT GLYCOSYLATED HEMOGLOBIN (HGB A1C)      Result Value Range   Hemoglobin A1C 5.0      Assessment and Plan: Unspecified constipation - Plan: POCT CBC, Comprehensive metabolic panel, TSH  Essential hypertension, benign - Plan: lisinopril-hydrochlorothiazide (PRINZIDE,ZESTORETIC) 20-25 MG per tablet  High cholesterol - Plan: Lipid panel, fenofibrate 54 MG tablet, pravastatin (PRAVACHOL) 40 MG tablet  Depression - Plan: PARoxetine (PAXIL) 40 MG tablet, clonazePAM (KLONOPIN) 0.5 MG tablet  Impaired glucose tolerance - Plan: POCT glycosylated hemoglobin (Hb A1C)  Anal itching - Plan: hydrocortisone (PROCTOCORT) 1 % CREA, Ambulatory referral to Gastroenterology  Await other labs.  BP is controlled, continue current medication.   Check FLP, continue pravachol refilled her paxil and klonopin for depression and anxiety Glucose is  under control with diet only.   Anal itching; went over strategies to combat constipation.  Avoid straining.  Use zinc/ hydrocortisone/ nystatin cream twice a day. May  also try witch hazel/ tuck's pads.  Referral to GI as she was told that she needed a repeat colonoscopy at 40 and is now overdue.   Signed Abbe Amsterdam, MD

## 2013-10-10 ENCOUNTER — Other Ambulatory Visit: Payer: Self-pay | Admitting: Family Medicine

## 2013-10-17 NOTE — Addendum Note (Signed)
Addended by: Sheppard PlumberBRIGGS, Shardae Kleinman A on: 10/17/2013 10:41 AM   Modules accepted: Orders

## 2013-10-17 NOTE — Telephone Encounter (Signed)
I'll pend RF for your review.

## 2013-10-17 NOTE — Telephone Encounter (Signed)
Dr Patsy Lageropland, I received another request for citalopram after sending back denial d/t it looking like you had taken pt off citalopram. Can you please review, I may have been mistaken and you may have wanted her to continue along with the Paxil. Under your plan from 07/2013 OV notes you mention use of Paxil and Klonopin for dep/anx and you did not RF citalopram at OV. Pt had reported taking both Paxil and citalopram at start of OV and when the med list from visit is pulled up, citalopram is on the list at start of OV but missing from list "at end of encounter" (that is why I thought you had taken her off). Please advise.

## 2013-10-18 NOTE — Telephone Encounter (Signed)
Called and LMOM with pt.  It was my understanding that she is taking paxil, not celexa.  I will call the [facility] and let them know that she does not need celexa any more.  If there is a mis- understanding and she is taking this for some reason please let me know.  Generally we would not have someone of 2 different SSRI medications

## 2013-11-05 ENCOUNTER — Telehealth: Payer: Self-pay

## 2013-11-05 DIAGNOSIS — F329 Major depressive disorder, single episode, unspecified: Secondary | ICD-10-CM

## 2013-11-05 DIAGNOSIS — F32A Depression, unspecified: Secondary | ICD-10-CM

## 2013-11-05 NOTE — Telephone Encounter (Signed)
Patient would like for us to call her and let her know a list of her antidepressant medications if possible [phone] if no answer she said we could leave a message

## 2013-11-07 NOTE — Telephone Encounter (Signed)
LM with medication list. Advised to rtn call if she had any further questions.

## 2013-11-08 MED ORDER — CITALOPRAM HYDROBROMIDE 40 MG PO TABS: ORAL_TABLET | ORAL | Status: DC

## 2013-11-08 NOTE — Telephone Encounter (Signed)
Pt went to the pharmacy. She picked up the Paxil and the copay is too expensive. She is unclear as to which medication she is suppose to be taking. She has stopped taking all of her antidepressants. She wants to find out what you would suggest for a replacement for an less expensive antidepressant. Advised pt to RTC to meet with you about changing this.

## 2013-11-08 NOTE — Telephone Encounter (Signed)
Called her back.  I have seen her once in October 2014.  It looks like prior to that she was on sertraline but that was too expensive. Then changed to celexa but complained of abdominal pain and headache.  Then was written for paxil in June- which she was apparently taking when I refilled it for her in October.  However she states she did not take this medication, never filled it because it was $80.  She has been off all SSRI for some time.  Unfortunately I was not aware of this problem until today.    At this time she would like to go back to celexa because "my stomach hurts anyway.  If I eat a piece of bread my stomach hurts." She felt that otherwise she did well with celexa.  I offered to have her try prozac but she states she is "afraid of that stuff, it makes people do crazy things."  Explained that prozac is an SSRI, just like paxil and celexa and zoloft. She would still prefer to go back on celexa.  This is fine, will taper her back onto her old dose and she will come and see us in about 2 weeks for a recheck. If any problems in the meantime she will let us know.

## 2013-11-08 NOTE — Telephone Encounter (Signed)
Patient returned call [phone]

## 2013-12-04 ENCOUNTER — Other Ambulatory Visit: Payer: Self-pay | Admitting: Family Medicine

## 2013-12-10 ENCOUNTER — Encounter (HOSPITAL_COMMUNITY): Payer: Self-pay | Admitting: Emergency Medicine

## 2013-12-10 ENCOUNTER — Emergency Department (HOSPITAL_COMMUNITY)
Admission: EM | Admit: 2013-12-10 | Discharge: 2013-12-10 | Disposition: A | Discharge status: Home / Self Care | Payer: BC Managed Care – PPO | Source: Home / Self Care | Attending: Emergency Medicine | Admitting: Emergency Medicine

## 2013-12-10 DIAGNOSIS — S335XXA Sprain of ligaments of lumbar spine, initial encounter: Secondary | ICD-10-CM

## 2013-12-10 DIAGNOSIS — S39012A Strain of muscle, fascia and tendon of lower back, initial encounter: Secondary | ICD-10-CM

## 2013-12-10 MED ORDER — IBUPROFEN 800 MG PO TABS
ORAL_TABLET | ORAL | Status: AC
  Filled 2013-12-10: qty 1

## 2013-12-10 MED ORDER — OXYCODONE-ACETAMINOPHEN 5-325 MG PO TABS
ORAL_TABLET | ORAL | Status: DC
Start: 2013-12-10 — End: 2015-07-24

## 2013-12-10 MED ORDER — CYCLOBENZAPRINE HCL 5 MG PO TABS: 5.0000 mg | ORAL_TABLET | Freq: Three times a day (TID) | ORAL | Status: DC | PRN

## 2013-12-10 MED ORDER — IBUPROFEN 800 MG PO TABS
800.0000 mg | ORAL_TABLET | Freq: Once | ORAL | Status: AC
  Administered 2013-12-10: 800 mg via ORAL

## 2013-12-10 MED ORDER — DICLOFENAC SODIUM 75 MG PO TBEC: 75.0000 mg | DELAYED_RELEASE_TABLET | Freq: Two times a day (BID) | ORAL | Status: DC

## 2013-12-10 NOTE — ED Notes (Signed)
Reports lower back injury.  States "bending and reaching over pt to clean her and heard/felt a pop in her lower back causing severe pain".  Incident happened around 11 a.m this morning.
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
## 2013-12-10 NOTE — Discharge Instructions (Signed)
Do exercises twice daily followed by moist heat for 15 minutes. ° ° ° ° ° °Try to be as active as possible. ° °If no better in 2 weeks, follow up with orthopedist. ° ° °

## 2014-02-11 ENCOUNTER — Other Ambulatory Visit: Payer: Self-pay | Admitting: Physician Assistant

## 2014-03-15 ENCOUNTER — Ambulatory Visit (INDEPENDENT_AMBULATORY_CARE_PROVIDER_SITE_OTHER): Payer: BC Managed Care – PPO | Admitting: Family Medicine

## 2014-03-15 VITALS — BP 110/72 | HR 89 | Temp 97.4°F | Resp 20 | Ht 68.0 in | Wt 225.5 lb

## 2014-03-15 DIAGNOSIS — F329 Major depressive disorder, single episode, unspecified: Secondary | ICD-10-CM

## 2014-03-15 DIAGNOSIS — E7800 Pure hypercholesterolemia, unspecified: Secondary | ICD-10-CM

## 2014-03-15 DIAGNOSIS — I10 Essential (primary) hypertension: Secondary | ICD-10-CM

## 2014-03-15 DIAGNOSIS — F32A Depression, unspecified: Secondary | ICD-10-CM

## 2014-03-15 DIAGNOSIS — F411 Generalized anxiety disorder: Secondary | ICD-10-CM

## 2014-03-15 DIAGNOSIS — M722 Plantar fascial fibromatosis: Secondary | ICD-10-CM

## 2014-03-15 DIAGNOSIS — F3289 Other specified depressive episodes: Secondary | ICD-10-CM

## 2014-03-15 MED ORDER — CITALOPRAM HYDROBROMIDE 40 MG PO TABS: ORAL_TABLET | ORAL | Status: DC

## 2014-03-15 MED ORDER — LISINOPRIL-HYDROCHLOROTHIAZIDE 20-25 MG PO TABS: 1.0000 | ORAL_TABLET | Freq: Every day | ORAL | Status: DC

## 2014-03-15 MED ORDER — CLONAZEPAM 0.5 MG PO TABS: 0.5000 mg | ORAL_TABLET | Freq: Two times a day (BID) | ORAL | Status: DC | PRN

## 2014-03-15 MED ORDER — PRAVASTATIN SODIUM 40 MG PO TABS: 40.0000 mg | ORAL_TABLET | Freq: Every day | ORAL | Status: DC

## 2014-03-15 MED ORDER — FENOFIBRATE 54 MG PO TABS: 54.0000 mg | ORAL_TABLET | Freq: Every day | ORAL | Status: DC

## 2014-03-15 NOTE — Progress Notes (Signed)
Subjective:  Patient is here for a routine visit. Has history of hypertension, anxiety depression, hyperlipidemia. She needs a medication refill. She also has been having problems with pain in the arch of her right foot. No specific trauma. It's been hurting her for sometime.  Objective: Pleasant lady in no major distress. Throat clear. Neck supple without nodes or thyromegaly. Chest clear. Heart regular without murmurs. And soft without mass or tenderness. No ankle edema. Very tender in the right heel at the base of the calcaneus. Hurts to dorsiflex the foot. No swelling.  Assessment: Plantar fasciitis Hypertension Hyperlipidemia Anxiety and depression  Plan: Refill her medications Tylenol or ibuprofen for pain heel stretch See discharge instructions

## 2014-03-15 NOTE — Patient Instructions (Addendum)
Continue current medication. We will let you know the results of your labs, and if any medication changes need to be made based on these. However I anticipate you being able to continue what you're on for now.  Return in 6 months, sooner if problems  Plantar Fasciitis Plantar fasciitis is a common condition that causes foot pain. It is soreness (inflammation) of the band of tough fibrous tissue on the bottom of the foot that runs from the heel bone (calcaneus) to the ball of the foot. The cause of this soreness may be from excessive standing, poor fitting shoes, running on hard surfaces, being overweight, having an abnormal walk, or overuse (this is common in runners) of the painful foot or feet. It is also common in aerobic exercise dancers and ballet dancers. SYMPTOMS  Most people with plantar fasciitis complain of:  Severe pain in the morning on the bottom of their foot especially when taking the first steps out of bed. This pain recedes after a few minutes of walking.  Severe pain is experienced also during walking following a long period of inactivity.  Pain is worse when walking barefoot or up stairs DIAGNOSIS   Your caregiver will diagnose this condition by examining and feeling your foot.  Special tests such as X-rays of your foot, are usually not needed. PREVENTION   Consult a sports medicine professional before beginning a new exercise program.  Walking programs offer a good workout. With walking there is a lower chance of overuse injuries common to runners. There is less impact and less jarring of the joints.  Begin all new exercise programs slowly. If problems or pain develop, decrease the amount of time or distance until you are at a comfortable level.  Wear good shoes and replace them regularly.  Stretch your foot and the heel cords at the back of the ankle (Achilles tendon) both before and after exercise.  Run or exercise on even surfaces that are not hard. For example,  asphalt is better than pavement.  Do not run barefoot on hard surfaces.  If using a treadmill, vary the incline.  Do not continue to workout if you have foot or joint problems. Seek professional help if they do not improve. HOME CARE INSTRUCTIONS   Avoid activities that cause you pain until you recover.  Use ice or cold packs on the problem or painful areas after working out.  Only take over-the-counter or prescription medicines for pain, discomfort, or fever as directed by your caregiver.  Soft shoe inserts or athletic shoes with air or gel sole cushions may be helpful.  If problems continue or become more severe, consult a sports medicine caregiver or your own health care provider. Cortisone is a potent anti-inflammatory medication that may be injected into the painful area. You can discuss this treatment with your caregiver. MAKE SURE YOU:   Understand these instructions.  Will watch your condition.  Will get help right away if you are not doing well or get worse. Document Released: 06/21/2001 Document Revised: 12/19/2011 Document Reviewed: 08/20/2008 [facility] Patient Information 2014 [place], [place].

## 2014-03-16 LAB — COMPREHENSIVE METABOLIC PANEL
ALBUMIN: 4.2 g/dL (ref 3.5–5.2)
ALT: 14 U/L (ref 0–35)
AST: 14 U/L (ref 0–37)
Alkaline Phosphatase: 66 U/L (ref 39–117)
BUN: 20 mg/dL (ref 6–23)
CALCIUM: 9.6 mg/dL (ref 8.4–10.5)
CHLORIDE: 103 meq/L (ref 96–112)
CO2: 27 meq/L (ref 19–32)
Creat: 0.86 mg/dL (ref 0.50–1.10)
Glucose, Bld: 110 mg/dL — ABNORMAL HIGH (ref 70–99)
POTASSIUM: 4 meq/L (ref 3.5–5.3)
Sodium: 138 mEq/L (ref 135–145)
Total Bilirubin: 0.5 mg/dL (ref 0.2–1.2)
Total Protein: 7.1 g/dL (ref 6.0–8.3)

## 2014-03-16 LAB — LIPID PANEL
CHOL/HDL RATIO: 4.4 ratio
Cholesterol: 202 mg/dL — ABNORMAL HIGH (ref 0–200)
HDL: 46 mg/dL (ref >39)
LDL Cholesterol: 122 mg/dL — ABNORMAL HIGH (ref 0–99)
Triglycerides: 170 mg/dL — ABNORMAL HIGH (ref <150)
VLDL: 34 mg/dL (ref 0–40)

## 2014-03-18 ENCOUNTER — Encounter: Payer: Self-pay | Admitting: Family Medicine

## 2014-03-21 ENCOUNTER — Ambulatory Visit (INDEPENDENT_AMBULATORY_CARE_PROVIDER_SITE_OTHER): Payer: BC Managed Care – PPO | Admitting: Family Medicine

## 2014-03-21 ENCOUNTER — Ambulatory Visit (INDEPENDENT_AMBULATORY_CARE_PROVIDER_SITE_OTHER): Payer: BC Managed Care – PPO

## 2014-03-21 ENCOUNTER — Ambulatory Visit: Payer: BC Managed Care – PPO

## 2014-03-21 ENCOUNTER — Encounter: Payer: Self-pay | Admitting: Family Medicine

## 2014-03-21 VITALS — BP 140/90 | HR 89 | Temp 98.5°F | Resp 18 | Ht 68.0 in | Wt 227.0 lb

## 2014-03-21 DIAGNOSIS — M7660 Achilles tendinitis, unspecified leg: Secondary | ICD-10-CM

## 2014-03-21 DIAGNOSIS — M25579 Pain in unspecified ankle and joints of unspecified foot: Secondary | ICD-10-CM

## 2014-03-21 DIAGNOSIS — M7731 Calcaneal spur, right foot: Secondary | ICD-10-CM

## 2014-03-21 MED ORDER — MELOXICAM 15 MG PO TABS: 15.0000 mg | ORAL_TABLET | Freq: Every day | ORAL | Status: DC

## 2014-03-21 NOTE — Patient Instructions (Signed)

## 2014-03-21 NOTE — Progress Notes (Addendum)
Subjective:    Patient ID: Connie MarvelHelen Oley, female    DOB: 07/21/1956, 58 y.o.   MRN: [id number] This chart was scribed for Sherren MochaEva N Shaw, MD by Valera CastleSteven Perry, ED Scribe. This patient was seen in room 24 and the patient's care was started at 9:59 AM.  HPI Connie Wilson is a 58 y.o. female Seen 1 week previously by Dr. Alwyn RenHopper. Diagnosed with plantar fascitis in her left foot. She was recommended to do heel stretches and PRN OTC insets.   Pt reports now having complications with her right foot. She reports constant right posterior ankle pain, onset 2 days ago. She has tried Ibuprofen, with only some relief. She denies having tried ice. She normally wears tennis shoes. She reports previous fx in that foot. She last saw her orthopedist 2 years ago after her fx.   ORIF right ankle fx in 12/2009.   12/2009  RIGHT ANKLE - COMPLETE 3+ VIEW  Comparison: None.  Findings: Lateral subluxation of the mortise joint is associated  with a transverse fracture of the medial malleolus, and oblique  fracture of the lateral malleolus at the level of the tibiofibular  syndesmosis, and a possible nondisplaced posterior malleolar  fracture. Both medial and lateral malleolar fractures show mild  lateral angulation and displacement. Talar dome appears intact.  Calcaneus appears intact with degenerative spurring.  IMPRESSION:  1. Lateral subluxation of the mortise joint with mildly displaced  medial and lateral malleolus fractures.  2. Possible nondisplaced posterior malleolar fracture.    Patient Active Problem List   Diagnosis Date Noted  . Elevated glucose 09/29/2012  . HTN (hypertension) 01/06/2012  . Hyperlipidemia 01/06/2012   Past Medical History  Diagnosis Date  . Hyperlipidemia   . Hypertension   . Anxiety   . Depression   . Allergy   . Arthritis    Past Surgical History  Procedure Laterality Date  . Fx. r ankle    . Tubal ligation    . Fracture surgery     Allergies  Allergen  Reactions  . Penicillins Rash   Prior to Admission medications   Medication Sig Start Date End Date Taking? Authorizing Provider  Ascorbic Acid (VITAMIN C) 1000 MG tablet Take 1,000 mg by mouth daily.    Historical Provider, MD  calcium citrate (CALCITRATE - DOSED IN MG ELEMENTAL CALCIUM) 950 MG tablet Take 1 tablet by mouth 2 (two) times daily.    Historical Provider, MD  citalopram (CELEXA) 40 MG tablet Daily for anxiety and depression 03/15/14   Peyton Najjaravid H Hopper, MD  clonazePAM (KLONOPIN) 0.5 MG tablet Take 1 tablet (0.5 mg total) by mouth 2 (two) times daily as needed for anxiety. 03/15/14   Peyton Najjaravid H Hopper, MD  fenofibrate 54 MG tablet Take 1 tablet (54 mg total) by mouth daily. 03/15/14   Peyton Najjaravid H Hopper, MD  fish oil-omega-3 fatty acids 1000 MG capsule Take 1 g by mouth 2 (two) times daily.     Historical Provider, MD  lisinopril-hydrochlorothiazide (PRINZIDE,ZESTORETIC) 20-25 MG per tablet Take 1 tablet by mouth daily. 03/15/14   Peyton Najjaravid H Hopper, MD  Multiple Vitamin (MULTIVITAMIN) tablet Take 1 tablet by mouth daily.    Historical Provider, MD  oxyCODONE-acetaminophen (PERCOCET) 5-325 MG per tablet 1 to 2 tablets every 6 hours as needed for pain. 12/10/13   Reuben Likesavid C Keller, MD  pravastatin (PRAVACHOL) 40 MG tablet Take 1 tablet (40 mg total) by mouth daily. 03/15/14   Peyton Najjaravid H Hopper, MD   Review of  Systems  Musculoskeletal: Positive for arthralgias (right ankle). Negative for gait problem and joint swelling.  Skin: Negative for wound.  Neurological: Negative for weakness and numbness.    BP 140/90  Pulse 89  Temp(Src) 98.5 F (36.9 C) (Oral)  Resp 18  Ht 5\' 8"  (1.727 m)  Wt 227 lb (102.967 kg)  BMI 34.52 kg/m2  SpO2 99%     Objective:   Physical Exam  Nursing note and vitals reviewed. Constitutional: She is oriented to person, place, and time. She appears well-developed and well-nourished. No distress.  HENT:  Head: Normocephalic and atraumatic.  Eyes: Conjunctivae and EOM are normal.    Neck: Normal range of motion. No tracheal deviation present.  Cardiovascular: Normal rate.   Pulmonary/Chest: Effort normal. No respiratory distress.  Musculoskeletal: Normal range of motion. She exhibits tenderness. She exhibits no edema.       Right ankle: She exhibits normal range of motion, no swelling and no deformity. Achilles tendon exhibits pain. Achilles tendon exhibits no defect and normal Thompson's test results.  1+ dorsalis pedis and tibial pulses. Tenderness to palpation over right achilles insertion over calcaneus. Pain with resisted plantar flexion. No pain with resisted dorsa flexion. Foot eversion symmetrical bilaterally with extension and flexion. Negative Thompson test. Achilles intact. Good ROM.   Neurological: She is alert and oriented to person, place, and time.  Skin: Skin is warm and dry.  Psychiatric: She has a normal mood and affect. Her behavior is normal.   [facility] reading (PRIMARY) by Dr. Clelia CroftShaw: Calcaneal spurs plantar and posterior aspect of calcaneus scene. Prior lateral malleolar plate appears to be intact and unaffected.  EXAM: RIGHT OS CALCIS - 2+ VIEW  COMPARISON: Ankle films same day.  FINDINGS: No focal abnormality seen. Fixation hardware is present over the distal fibula.  IMPRESSION: No acute findings.  COMPARISON: No priors.  FINDINGS: Postoperative changes of ORIF in the distal fibula are noted, with a lateral plate and screw fixation device, as well as an obliquely oriented fixation screw. Degenerative changes at the tibiotalar joint and noted. No acute displaced fracture, subluxation or dislocation. Large plantar calcaneal spur is noteda. Extensive soft tissue swelling beneath the plantar calcaneal spur.  IMPRESSION: 1. Extensive soft tissue swelling along the plantar aspect of the heel beneath a large plantar calcaneal spur. 2. No acute bony abnormality. 3. Postoperative changes of ORIF of the distal fibula.     Assessment & Plan:    Pain in joint, ankle and foot - Plan: DG Ankle Complete Right, DG Os Calcis Right  Calcaneal spur, right  Achilles tendonitis  Meds ordered this encounter  Medications  . meloxicam (MOBIC) 15 MG tablet    Sig: Take 1 tablet (15 mg total) by mouth daily.    Dispense:  30 tablet    Refill:  1    I personally performed the services described in this documentation, which was scribed in my presence. The recorded information has been reviewed and considered, and addended by me as needed.  Norberto SorensonEva Shaw, MD MPH

## 2014-08-30 ENCOUNTER — Other Ambulatory Visit: Payer: Self-pay | Admitting: Family Medicine

## 2014-09-01 NOTE — Telephone Encounter (Signed)
Call: Needs seen.  It is almost 6 months since I refilled her meds for her and I do not know her well on the history of these medications.  She needs seen before refills.  Can see me or anyone of her choosing or just walk in.

## 2014-09-01 NOTE — Telephone Encounter (Signed)
Needs seen

## 2014-09-17 ENCOUNTER — Ambulatory Visit (INDEPENDENT_AMBULATORY_CARE_PROVIDER_SITE_OTHER): Payer: BC Managed Care – PPO | Admitting: Family Medicine

## 2014-09-17 VITALS — BP 128/76 | HR 87 | Temp 98.4°F | Resp 17 | Ht 69.0 in | Wt 226.0 lb

## 2014-09-17 DIAGNOSIS — F32A Depression, unspecified: Secondary | ICD-10-CM

## 2014-09-17 DIAGNOSIS — Z23 Encounter for immunization: Secondary | ICD-10-CM

## 2014-09-17 DIAGNOSIS — R7301 Impaired fasting glucose: Secondary | ICD-10-CM

## 2014-09-17 DIAGNOSIS — Z79899 Other long term (current) drug therapy: Secondary | ICD-10-CM

## 2014-09-17 DIAGNOSIS — E785 Hyperlipidemia, unspecified: Secondary | ICD-10-CM

## 2014-09-17 DIAGNOSIS — Z111 Encounter for screening for respiratory tuberculosis: Secondary | ICD-10-CM

## 2014-09-17 DIAGNOSIS — F329 Major depressive disorder, single episode, unspecified: Secondary | ICD-10-CM

## 2014-09-17 DIAGNOSIS — F4321 Adjustment disorder with depressed mood: Secondary | ICD-10-CM

## 2014-09-17 LAB — COMPREHENSIVE METABOLIC PANEL
ALT: 20 U/L (ref 0–35)
AST: 21 U/L (ref 0–37)
Albumin: 4 g/dL (ref 3.5–5.2)
Alkaline Phosphatase: 67 U/L (ref 39–117)
BILIRUBIN TOTAL: 0.4 mg/dL (ref 0.2–1.2)
BUN: 15 mg/dL (ref 6–23)
CO2: 25 meq/L (ref 19–32)
Calcium: 9 mg/dL (ref 8.4–10.5)
Chloride: 102 mEq/L (ref 96–112)
Creat: 0.82 mg/dL (ref 0.50–1.10)
Glucose, Bld: 115 mg/dL — ABNORMAL HIGH (ref 70–99)
Potassium: 4 mEq/L (ref 3.5–5.3)
SODIUM: 137 meq/L (ref 135–145)
TOTAL PROTEIN: 6.8 g/dL (ref 6.0–8.3)

## 2014-09-17 LAB — TSH: TSH: 0.997 u[IU]/mL (ref 0.350–4.500)

## 2014-09-17 LAB — POCT GLYCOSYLATED HEMOGLOBIN (HGB A1C): Hemoglobin A1C: 5.9

## 2014-09-17 MED ORDER — CITALOPRAM HYDROBROMIDE 40 MG PO TABS: ORAL_TABLET | ORAL | Status: DC

## 2014-09-17 MED ORDER — CLONAZEPAM 0.5 MG PO TABS: ORAL_TABLET | ORAL | Status: DC

## 2014-09-17 NOTE — Patient Instructions (Addendum)
I will be in touch with your labs via letter.  Continue to use the celexa.  If your symptoms do not improve we can consider changing to another medication However the best think for you to do right now may be to try and reduce the stress in your life.  Consider calling the mental health line through your insurance company and asking about counseling!   You may take another 1/2 or whole klonopin during the day as needed for anxiety.  Remember these can cause sedation so you should not drive if you feel sleepy!  Try to find some time for exercise in your life- walking for 20 minutes 5 times a week would be great for your body and mind!  Come and see us for your TB test reading

## 2014-09-17 NOTE — Progress Notes (Signed)
[facility] [address], [place] [address] [phone]  Date:  09/17/2014   Name:  Connie Wilson   DOB:  1955/12/30   MRN:  [id number]  PCP:  No PCP Per Patient    Chief Complaint: Medication Refill   History of Present Illness:  Connie Wilson is a 58 y.o. very pleasant female patient who presents with the following:  She is here today for follow-up of her medications.   She is on celexa 97- she has been on this dose for many years. She also uses klonopin 0.5 at bedtime.   She feels like she has been more anxious for 3-4 days.  She is not feeling sad, but may have some anxiety attacks and even will start shaking at times when she feels nervous.    Sleep is ok- she does tend to get up once or twice to urinate.Marland Kitchen  Appetite has been ok- maybe too good. Not exercising.   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  She knows that she is trying to do too much and this is likely why she is feeling stressed.    She would like a PPD today for work and also would like to catch up on her flu shot and Tdap    Tuberculosis Risk Questionnaire  1. No Were you born outside the [place] in one of the following parts of the world: [place], [place], [place], [place] or [place]?    2. No Have you traveled outside the [place] and lived for more than one month in one of the following parts of the world: [place], [place], [place], [place] or [place]?    3. No Do you have a compromised immune system such as from any of the following conditions:HIV/AIDS, organ or bone marrow transplantation, diabetes, immunosuppressive medicines (e.g. Prednisone, Remicaide), leukemia, lymphoma, cancer of the head or neck, gastrectomy or jejunal bypass, end-stage renal disease (on dialysis), or silicosis?     4. No Have you ever or do you plan on working in: a residential care center, a health care facility, a jail or prison or homeless  shelter?    5. No Have you ever: injected illegal drugs, used crack cocaine, lived in a homeless shelter  or been in jail or prison?     6. No Have you ever been exposed to anyone with infectious tuberculosis?    Tuberculosis Symptom Questionnaire  Do you currently have any of the following symptoms?  1. No Unexplained cough lasting more than 3 weeks?   2. No Unexplained fever lasting more than 3 weeks.   3. No Night Sweats (sweating that leaves the bedclothes and sheets wet)     4. No Shortness of Breath   5. No Chest Pain   6. No Unintentional weight loss    7. No Unexplained fatigue (very tired for no reason)     Patient Active Problem List   Diagnosis Date Noted  . Elevated glucose 09/29/2012  . HTN (hypertension) 01/06/2012  . Hyperlipidemia 01/06/2012    Past Medical History  Diagnosis Date  . Hyperlipidemia   . Hypertension   . Anxiety   . Depression   . Allergy   . Arthritis     Past Surgical History  Procedure Laterality Date  . Fx. r ankle    . Tubal ligation    . Fracture surgery      History  Substance Use Topics  .  Smoking status: Never Smoker   . Smokeless tobacco: Never Used  . Alcohol Use: No    Family History  Problem Relation Age of Onset  . Ovarian cancer Mother   . Cancer Mother     ovarian  . Heart failure Father   . Colon cancer Father   . Hypertension Father   . Cancer Father     prostate  . Hypertension Sister   . Hyperlipidemia Sister   . Diabetes Maternal Grandmother   . Heart attack Maternal Grandfather     Allergies  Allergen Reactions  . Penicillins Rash    Medication list has been reviewed and updated.  Current Outpatient Prescriptions on File Prior to Visit  Medication Sig Dispense Refill  . Ascorbic Acid (VITAMIN C) 1000 MG tablet Take 1,000 mg by mouth daily.    . calcium citrate (CALCITRATE - DOSED IN MG ELEMENTAL CALCIUM) 950 MG tablet Take 1 tablet by mouth 2 (two) times daily.    . citalopram  (CELEXA) 40 MG tablet Daily for anxiety and depression 30 tablet 6  . clonazePAM (KLONOPIN) 0.5 MG tablet Take 1 tablet (0.5 mg total) by mouth 2 (two) times daily as needed for anxiety. 90 tablet 1  . fenofibrate 54 MG tablet Take 1 tablet (54 mg total) by mouth daily. 90 tablet 3  . fish oil-omega-3 fatty acids 1000 MG capsule Take 1 g by mouth 2 (two) times daily.     Marland Kitchen. lisinopril-hydrochlorothiazide (PRINZIDE,ZESTORETIC) 20-25 MG per tablet Take 1 tablet by mouth daily. 90 tablet 3  . meloxicam (MOBIC) 15 MG tablet Take 1 tablet (15 mg total) by mouth daily. 30 tablet 1  . Multiple Vitamin (MULTIVITAMIN) tablet Take 1 tablet by mouth daily.    Marland Kitchen. oxyCODONE-acetaminophen (PERCOCET) 5-325 MG per tablet 1 to 2 tablets every 6 hours as needed for pain. 20 tablet 0  . pravastatin (PRAVACHOL) 40 MG tablet Take 1 tablet (40 mg total) by mouth daily. 90 tablet 3   No current facility-administered medications on file prior to visit.    Review of Systems:  As per HPI- otherwise negative.   Physical Examination: Filed Vitals:   09/17/14 0957  BP: 128/76  Pulse: 87  Temp: 98.4 F (36.9 C)  Resp: 17   Filed Vitals:   09/17/14 0957  Height: 5\' 9"  (1.753 m)  Weight: 226 lb (102.513 kg)   Body mass index is 33.36 kg/(m^2). Ideal Body Weight: Weight in (lb) to have BMI = 25: 168.9  GEN: WDWN, NAD, Non-toxic, A & O x 3, obese, looks well HEENT: Atraumatic, Normocephalic. Neck supple. No masses, No LAD. Ears and Nose: No external deformity. CV: RRR, No M/G/R. No JVD. No thrill. No extra heart sounds. PULM: CTA B, no wheezes, crackles, rhonchi. No retractions. No resp. distress. No accessory muscle use. EXTR: No c/c/e NEURO Normal gait.  PSYCH: Normally interactive. Conversant. Not depressed or anxious appearing.  Calm demeanor.    Assessment and Plan: Impaired fasting glucose - Plan: POCT glycosylated hemoglobin (Hb A1C), CANCELED: Hemoglobin A1c  Hyperlipidemia  Medication  management - Plan: Comprehensive metabolic panel  Adjustment disorder with depressed mood - Plan: TSH  Depression - Plan: clonazePAM (KLONOPIN) 0.5 MG tablet, citalopram (CELEXA) 40 MG tablet  Immunization due - Plan: Tdap vaccine greater than or equal to 7yo IM, Flu Vaccine QUAD 36+ mos IM  PPD screening test - Plan: TB Skin Test  Refilled her medications and check labs as above.   Encouraged her to call  the mental health line on her insurance card and ask about counseling Encouraged exercise.  She will try a small amount of klonopin during the day if needed  Signed Abbe AmsterdamJessica Copland, MD  Mail her a letter with her labs

## 2014-09-18 ENCOUNTER — Encounter: Payer: Self-pay | Admitting: Family Medicine

## 2014-09-19 ENCOUNTER — Ambulatory Visit (INDEPENDENT_AMBULATORY_CARE_PROVIDER_SITE_OTHER): Payer: BC Managed Care – PPO

## 2014-09-19 ENCOUNTER — Telehealth: Payer: Self-pay

## 2014-09-19 DIAGNOSIS — F32A Depression, unspecified: Secondary | ICD-10-CM

## 2014-09-19 DIAGNOSIS — Z111 Encounter for screening for respiratory tuberculosis: Secondary | ICD-10-CM

## 2014-09-19 DIAGNOSIS — F329 Major depressive disorder, single episode, unspecified: Secondary | ICD-10-CM

## 2014-09-19 LAB — TB SKIN TEST
Induration: 0 mm
TB Skin Test: NEGATIVE

## 2014-09-19 MED ORDER — CITALOPRAM HYDROBROMIDE 40 MG PO TABS: ORAL_TABLET | ORAL | Status: DC

## 2014-09-19 NOTE — Telephone Encounter (Signed)
Pharm called for clarification of sig for celexa. Per Dr Cyndie Chimeopland's OV notes, pt takes 40 mg, 1 tab daily. Gave pharm info and changed in EPIC

## 2014-09-19 NOTE — Progress Notes (Signed)
   Subjective:    Patient ID: Connie MarvelHelen Wilson, female    DOB: Sep 10, 1956, 58 y.o.   MRN: [id number]  HPI 58 year old female here for PPD read.    Review of Systems     Objective:   Physical Exam        Assessment & Plan:  Pt is negative for PPD; 0mm induration

## 2014-12-04 ENCOUNTER — Other Ambulatory Visit: Payer: Self-pay

## 2014-12-04 NOTE — Telephone Encounter (Signed)
Dr Patsy Lageropland, at top of OV notes under "reason for visit" pt listed promethazine as one of the meds needing RFd, but then I don't see that it was discussed. OK to RF?

## 2014-12-04 NOTE — Telephone Encounter (Signed)
Declined to fill rx at this time.  Called and LMOM asking her to give me more details about this request- how and why is she using this medication.  I have not filled it for her in the past as far as I know

## 2014-12-11 ENCOUNTER — Other Ambulatory Visit: Payer: Self-pay | Admitting: Family Medicine

## 2015-05-23 ENCOUNTER — Other Ambulatory Visit: Payer: Self-pay | Admitting: Family Medicine

## 2015-05-29 ENCOUNTER — Other Ambulatory Visit: Payer: Self-pay

## 2015-05-29 DIAGNOSIS — E7800 Pure hypercholesterolemia, unspecified: Secondary | ICD-10-CM

## 2015-05-29 MED ORDER — FENOFIBRATE 54 MG PO TABS: 54.0000 mg | ORAL_TABLET | Freq: Every day | ORAL | Status: DC

## 2015-07-24 ENCOUNTER — Other Ambulatory Visit: Payer: Self-pay | Admitting: Urgent Care

## 2015-07-24 ENCOUNTER — Ambulatory Visit (INDEPENDENT_AMBULATORY_CARE_PROVIDER_SITE_OTHER): Payer: Self-pay | Admitting: Urgent Care

## 2015-07-24 ENCOUNTER — Telehealth: Payer: Self-pay | Admitting: Urgent Care

## 2015-07-24 VITALS — BP 116/74 | HR 74 | Temp 97.8°F | Resp 18 | Ht 68.0 in | Wt 215.0 lb

## 2015-07-24 DIAGNOSIS — E785 Hyperlipidemia, unspecified: Secondary | ICD-10-CM

## 2015-07-24 DIAGNOSIS — I10 Essential (primary) hypertension: Secondary | ICD-10-CM

## 2015-07-24 DIAGNOSIS — F419 Anxiety disorder, unspecified: Secondary | ICD-10-CM

## 2015-07-24 LAB — COMPREHENSIVE METABOLIC PANEL
ALK PHOS: 76 U/L (ref 33–130)
ALT: 19 U/L (ref 6–29)
AST: 23 U/L (ref 10–35)
Albumin: 4.4 g/dL (ref 3.6–5.1)
BUN: 14 mg/dL (ref 7–25)
CHLORIDE: 95 mmol/L — AB (ref 98–110)
CO2: 29 mmol/L (ref 20–31)
CREATININE: 0.93 mg/dL (ref 0.50–1.05)
Calcium: 9.6 mg/dL (ref 8.6–10.4)
Glucose, Bld: 90 mg/dL (ref 65–99)
Potassium: 3.6 mmol/L (ref 3.5–5.3)
SODIUM: 137 mmol/L (ref 135–146)
Total Bilirubin: 0.7 mg/dL (ref 0.2–1.2)
Total Protein: 7.2 g/dL (ref 6.1–8.1)

## 2015-07-24 LAB — LIPID PANEL
CHOLESTEROL: 217 mg/dL — AB (ref 125–200)
HDL: 46 mg/dL (ref >=46)
LDL Cholesterol: 142 mg/dL — ABNORMAL HIGH (ref <130)
Total CHOL/HDL Ratio: 4.7 Ratio (ref <=5.0)
Triglycerides: 144 mg/dL (ref <150)
VLDL: 29 mg/dL (ref <30)

## 2015-07-24 MED ORDER — PRAVASTATIN SODIUM 40 MG PO TABS: ORAL_TABLET | ORAL | Status: DC

## 2015-07-24 MED ORDER — CLONAZEPAM 0.5 MG PO TABS: ORAL_TABLET | ORAL | Status: DC

## 2015-07-24 MED ORDER — CITALOPRAM HYDROBROMIDE 40 MG PO TABS: ORAL_TABLET | ORAL | Status: DC

## 2015-07-24 MED ORDER — LISINOPRIL-HYDROCHLOROTHIAZIDE 20-25 MG PO TABS: ORAL_TABLET | ORAL | Status: DC

## 2015-07-24 NOTE — Telephone Encounter (Signed)
Called and reported results by VM. Script sent electronically to her pharmacy for pravastatin refill.

## 2015-07-24 NOTE — Patient Instructions (Addendum)
Hypertension Hypertension, commonly called high blood pressure, is when the force of blood pumping through your arteries is too strong. Your arteries are the blood vessels that carry blood from your heart throughout your body. A blood pressure reading consists of a higher number over a lower number, such as 110/72. The higher number (systolic) is the pressure inside your arteries when your heart pumps. The lower number (diastolic) is the pressure inside your arteries when your heart relaxes. Ideally you want your blood pressure below 120/80. Hypertension forces your heart to work harder to pump blood. Your arteries may become narrow or stiff. Having untreated or uncontrolled hypertension can cause heart attack, stroke, kidney disease, and other problems. RISK FACTORS Some risk factors for high blood pressure are controllable. Others are not.  Risk factors you cannot control include:   Race. You may be at higher risk if you are African American.  Age. Risk increases with age.  Gender. Men are at higher risk than women before age 45 years. After age 65, women are at higher risk than men. Risk factors you can control include:  Not getting enough exercise or physical activity.  Being overweight.  Getting too much fat, sugar, calories, or salt in your diet.  Drinking too much alcohol. SIGNS AND SYMPTOMS Hypertension does not usually cause signs or symptoms. Extremely high blood pressure (hypertensive crisis) may cause headache, anxiety, shortness of breath, and nosebleed. DIAGNOSIS To check if you have hypertension, your health care provider will measure your blood pressure while you are seated, with your arm held at the level of your heart. It should be measured at least twice using the same arm. Certain conditions can cause a difference in blood pressure between your right and left arms. A blood pressure reading that is higher than normal on one occasion does not mean that you need treatment. If  it is not clear whether you have high blood pressure, you may be asked to return on a different day to have your blood pressure checked again. Or, you may be asked to monitor your blood pressure at home for 1 or more weeks. TREATMENT Treating high blood pressure includes making lifestyle changes and possibly taking medicine. Living a healthy lifestyle can help lower high blood pressure. You may need to change some of your habits. Lifestyle changes may include:  Following the DASH diet. This diet is high in fruits, vegetables, and whole grains. It is low in salt, red meat, and added sugars.  Keep your sodium intake below 2,300 mg per day.  Getting at least 30-45 minutes of aerobic exercise at least 4 times per week.  Losing weight if necessary.  Not smoking.  Limiting alcoholic beverages.  Learning ways to reduce stress. Your health care provider may prescribe medicine if lifestyle changes are not enough to get your blood pressure under control, and if one of the following is true:  You are 18-59 years of age and your systolic blood pressure is above 140.  You are 60 years of age or older, and your systolic blood pressure is above 150.  Your diastolic blood pressure is above 90.  You have diabetes, and your systolic blood pressure is over 140 or your diastolic blood pressure is over 90.  You have kidney disease and your blood pressure is above 140/90.  You have heart disease and your blood pressure is above 140/90. Your personal target blood pressure may vary depending on your medical conditions, your age, and other factors. HOME CARE INSTRUCTIONS    Have your blood pressure rechecked as directed by your health care provider.   Take medicines only as directed by your health care provider. Follow the directions carefully. Blood pressure medicines must be taken as prescribed. The medicine does not work as well when you skip doses. Skipping doses also puts you at risk for  problems.  Do not smoke.   Monitor your blood pressure at home as directed by your health care provider. SEEK MEDICAL CARE IF:   You think you are having a reaction to medicines taken.  You have recurrent headaches or feel dizzy.  You have swelling in your ankles.  You have trouble with your vision. SEEK IMMEDIATE MEDICAL CARE IF:  You develop a severe headache or confusion.  You have unusual weakness, numbness, or feel faint.  You have severe chest or abdominal pain.  You vomit repeatedly.  You have trouble breathing. MAKE SURE YOU:   Understand these instructions.  Will watch your condition.  Will get help right away if you are not doing well or get worse.   This information is not intended to replace advice given to you by your health care provider. Make sure you discuss any questions you have with your health care provider.   Document Released: 09/26/2005 Document Revised: 02/10/2015 Document Reviewed: 07/19/2013 Elsevier Interactive Patient Education 2016 Elsevier Inc.    Cholesterol Cholesterol is a white, waxy, fat-like substance needed by your body in small amounts. The liver makes all the cholesterol you need. Cholesterol is carried from the liver by the blood through the blood vessels. Deposits of cholesterol (plaque) may build up on blood vessel walls. These make the arteries narrower and stiffer. Cholesterol plaques increase the risk for heart attack and stroke.  You cannot feel your cholesterol level even if it is very high. The only way to know it is high is with a blood test. Once you know your cholesterol levels, you should keep a record of the test results. Work with your health care provider to keep your levels in the desired range.  WHAT DO THE RESULTS MEAN?  Total cholesterol is a rough measure of all the cholesterol in your blood.   LDL is the so-called bad cholesterol. This is the type that deposits cholesterol in the walls of the arteries. You  want this level to be low.   HDL is the good cholesterol because it cleans the arteries and carries the LDL away. You want this level to be high.  Triglycerides are fat that the body can either burn for energy or store. High levels are closely linked to heart disease.  WHAT ARE THE DESIRED LEVELS OF CHOLESTEROL?  Total cholesterol below 200.   LDL below 100 for people at risk, below 70 for those at very high risk.   HDL above 50 is good, above 60 is best.   Triglycerides below 150.  HOW CAN I LOWER MY CHOLESTEROL?  Diet. Follow your diet programs as directed by your health care provider.   Choose fish or white meat chicken and [place], roasted or baked. Limit fatty cuts of red meat, fried foods, and processed meats, such as sausage and lunch meats.   Eat lots of fresh fruits and vegetables.  Choose whole grains, beans, pasta, potatoes, and cereals.   Use only small amounts of olive, corn, or canola oils.   Avoid butter, mayonnaise, shortening, or palm kernel oils.  Avoid foods with trans fats.   Drink skim or nonfat milk and eat low-fat or  nonfat yogurt and cheeses. Avoid whole milk, cream, ice cream, egg yolks, and full-fat cheeses.   Healthy desserts include angel food cake, ginger snaps, animal crackers, hard candy, popsicles, and low-fat or nonfat frozen yogurt. Avoid pastries, cakes, pies, and cookies.   Exercise. Follow your exercise programs as directed by your health care provider.   A regular program helps decrease LDL and raise HDL.   A regular program helps with weight control.   Do things that increase your activity level like gardening, walking, or taking the stairs. Ask your health care provider about how you can be more active in your daily life.   Medicine. Take medicine only as directed by your health care provider.   Medicine may be prescribed by your health care provider to help lower cholesterol and decrease the risk for heart disease.    If you have several risk factors, you may need medicine even if your levels are normal.   This information is not intended to replace advice given to you by your health care provider. Make sure you discuss any questions you have with your health care provider.   Document Released: 06/21/2001 Document Revised: 10/17/2014 Document Reviewed: 07/10/2013 Elsevier Interactive Patient Education 2016 Elsevier Inc.    Clonazepam tablets What is this medicine? CLONAZEPAM (kloe NA ze pam) is a benzodiazepine. It is used to treat certain types of seizures. It is also used to treat panic disorder. This medicine may be used for other purposes; ask your health care provider or pharmacist if you have questions. What should I tell my health care provider before I take this medicine? They need to know if you have any of these conditions: -an alcohol or drug abuse problem -bipolar disorder, depression, psychosis or other mental health condition -glaucoma -kidney or liver disease -lung or breathing disease -myasthenia gravis -Parkinson's disease -porphyria -seizures or a history of seizures -suicidal thoughts -an unusual or allergic reaction to clonazepam, other benzodiazepines, foods, dyes, or preservatives -pregnant or trying to get pregnant -breast-feeding How should I use this medicine? Take this medicine by mouth with a glass of water. Follow the directions on the prescription label. If it upsets your stomach, take it with food or milk. Take your medicine at regular intervals. Do not take it more often than directed. Do not stop taking or change the dose except on the advice of your doctor or health care professional. A special MedGuide will be given to you by the pharmacist with each prescription and refill. Be sure to read this information carefully each time. Talk to your pediatrician regarding the use of this medicine in children. Special care may be needed. Overdosage: If you think you  have taken too much of this medicine contact a poison control center or emergency room at once. NOTE: This medicine is only for you. Do not share this medicine with others. What if I miss a dose? If you miss a dose, take it as soon as you can. If it is almost time for your next dose, take only that dose. Do not take double or extra doses. What may interact with this medicine? -herbal or dietary supplements -medicines for depression, anxiety, or psychotic disturbances -medicines for fungal infections like fluconazole, itraconazole, ketoconazole, voriconazole -medicines for HIV infection or AIDS -medicines for sleep -prescription pain medicines -propantheline -rifampin -sevelamer -some medicines for seizures like carbamazepine, phenobarbital, phenytoin, primidone This list may not describe all possible interactions. Give your health care provider a list of all the medicines, herbs, non-prescription  drugs, or dietary supplements you use. Also tell them if you smoke, drink alcohol, or use illegal drugs. Some items may interact with your medicine. What should I watch for while using this medicine? Visit your doctor or health care professional for regular checks on your progress. Your body may become dependent on this medicine. If you have been taking this medicine regularly for some time, do not suddenly stop taking it. You must gradually reduce the dose or you may get severe side effects. Ask your doctor or health care professional for advice before increasing or decreasing the dose. Even after you stop taking this medicine it can still affect your body for several days. If you suffer from several types of seizures, this medicine may increase the chance of grand mal seizures (epilepsy). Let your doctor or health care professional know, he or she may want to prescribe an additional medicine. You may get drowsy or dizzy. Do not drive, use machinery, or do anything that needs mental alertness until you  know how this medicine affects you. To reduce the risk of dizzy and fainting spells, do not stand or sit up quickly, especially if you are an older patient. Alcohol may increase dizziness and drowsiness. Avoid alcoholic drinks. Do not treat yourself for coughs, colds or allergies without asking your doctor or health care professional for advice. Some ingredients can increase possible side effects. The use of this medicine may increase the chance of suicidal thoughts or actions. Pay special attention to how you are responding while on this medicine. Any worsening of mood, or thoughts of suicide or dying should be reported to your health care professional right away. Women who become pregnant while using this medicine may enroll in the [place] Antiepileptic Drug Pregnancy Registry by calling [phone]. This registry collects information about the safety of antiepileptic drug use during pregnancy. What side effects may I notice from receiving this medicine? Side effects that you should report to your doctor or health care professional as soon as possible: -allergic reactions like skin rash, itching or hives, swelling of the face, lips, or tongue -changes in vision -confusion -depression -hallucinations -mood changes, excitability or aggressive behavior -movement difficulty, staggering or jerky movements -muscle cramps, weakness -tremors -unusual eye movements Side effects that usually do not require medical attention (report to your doctor or health care professional if they continue or are bothersome): -constipation or diarrhea -difficulty sleeping, nightmares -dizziness, drowsiness -headache -increased saliva from your mouth -nausea, vomiting This list may not describe all possible side effects. Call your doctor for medical advice about side effects. You may report side effects to FDA at 1-800-FDA-1088. Where should I keep my medicine? Keep out of the reach of children. This  medicine can be abused. Keep your medicine in a safe place to protect it from theft. Do not share this medicine with anyone. Selling or giving away this medicine is dangerous and against the law. This medicine may cause accidental overdose and death if taken by other adults, children, or pets. Mix any unused medicine with a substance like cat litter or coffee grounds. Then throw the medicine away in a sealed container like a sealed bag or a coffee can with a lid. Do not use the medicine after the expiration date. Store at room temperature between 15 and 30 degrees C (59 and 86 degrees F). Protect from light. Keep container tightly closed. NOTE: This sheet is a summary. It may not cover all possible information. If you have questions about this  medicine, talk to your doctor, pharmacist, or health care provider.    2016, Elsevier/Gold Standard. (2015-01-06 14:01:43)

## 2015-07-24 NOTE — Progress Notes (Signed)
    MRN: [id number] DOB: 1956-03-24  Subjective:   Connie Wilson is a 59 y.o. female presenting for chief complaint of Medication Refill  Anxiety - managed with Celexa. Feels like this works well for her. She is undergoing some stress due to helping her ex-husband recover from cancer/surgery. However, he will be moving out soon and patient hopes things will turn around then. 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 Takes 1/2 to 1 tablet of Klonopin and other otc sleep aids. Patient admits that she never takes more than is prescribed. Denies any thoughts about death or of hurting others. Denies depressed mood.  HTN - managed with lis-HCT. Patient admits significant dietary changes and is exercising regularly, trying to manage her weight better. Denies lightheadedness, dizziness, chronic headache, double vision, chest pain, shortness of breath, heart racing, palpitations, nausea, vomiting, abdominal pain, hematuria, lower leg swelling.  HL - managed with fenofibrate and pravastatin. Patient denies myalgia, mental fog. Diet and exercise, review of systems as above.  Denies any other aggravating or relieving factors, no other questions or concerns.  Connie Wilson has a current medication list which includes the following prescription(s): vitamin c, calcium citrate, citalopram, clonazepam, fenofibrate, fish oil-omega-3 fatty acids, lisinopril-hydrochlorothiazide, multivitamin, pravastatin, meloxicam, and oxycodone-acetaminophen. Also is allergic to penicillins.  Connie Wilson  has a past medical history of Hyperlipidemia; Hypertension; Anxiety; Depression; Allergy; and Arthritis. Also  has past surgical history that includes fx. R ankle; Tubal ligation; and Fracture surgery.  Objective:   Vitals: BP 116/74 mmHg  Pulse 74  Temp(Src) 97.8 F (36.6 C) (Oral)  Resp 18  Ht 5\' 8"  (1.727 m)  Wt 215 lb (97.523 kg)  BMI 32.70 kg/m2  SpO2  98%  BP Readings from Last 3 Encounters:  07/24/15 116/74  09/17/14 128/76  03/21/14 140/90   Wt Readings from Last 3 Encounters:  07/24/15 215 lb (97.523 kg)  09/17/14 226 lb (102.513 kg)  03/21/14 227 lb (102.967 kg)   Physical Exam  Constitutional: She is oriented to person, place, and time. She appears well-developed and well-nourished.  HENT:  Mouth/Throat: Oropharynx is clear and moist.  Eyes: Conjunctivae are normal. Pupils are equal, round, and reactive to light. No scleral icterus.  Neck: Normal range of motion. Neck supple. No thyromegaly present.  Cardiovascular: Normal rate, regular rhythm and intact distal pulses.   Pulmonary/Chest: No respiratory distress. She has no wheezes. She has no rales.  Abdominal: Soft. Bowel sounds are normal. She exhibits no distension and no mass. There is no tenderness.  Musculoskeletal: She exhibits no edema.  Neurological: She is alert and oriented to person, place, and time.  Skin: Skin is warm and dry. No rash noted. No erythema. No pallor.  Psychiatric: She has a normal mood and affect.   Assessment and Plan :   1. Essential hypertension, benign - Labs pending - Well-controlled, continue healthy diet and exercise. Refill provided for one year, patient is to follow up in 6 months.  2. Hyperlipidemia - Labs pending, refill 1 cholesterol medicine as appropriate.  3. Anxiety - Refilled Celexa and Klonopin, counseled patient on taking Klonopin as prescribed, discussed risks and potential for side effects. Patient verbalized understanding. She is to follow up with us in 6 months.  Wallis BambergMario Cynthia Stainback, PA-C [facility] [phone] 07/24/2015 8:34 AM

## 2015-08-13 ENCOUNTER — Other Ambulatory Visit: Payer: Self-pay | Admitting: Family Medicine

## 2015-10-01 ENCOUNTER — Telehealth: Payer: Self-pay | Admitting: Physician Assistant

## 2015-10-01 ENCOUNTER — Ambulatory Visit (INDEPENDENT_AMBULATORY_CARE_PROVIDER_SITE_OTHER): Payer: 59 | Admitting: Family Medicine

## 2015-10-01 VITALS — BP 118/74 | HR 88 | Temp 97.8°F | Resp 16 | Ht 68.0 in

## 2015-10-01 DIAGNOSIS — R197 Diarrhea, unspecified: Secondary | ICD-10-CM | POA: Diagnosis not present

## 2015-10-01 DIAGNOSIS — R0981 Nasal congestion: Secondary | ICD-10-CM

## 2015-10-01 DIAGNOSIS — Z789 Other specified health status: Secondary | ICD-10-CM | POA: Diagnosis not present

## 2015-10-01 DIAGNOSIS — R112 Nausea with vomiting, unspecified: Secondary | ICD-10-CM | POA: Diagnosis not present

## 2015-10-01 DIAGNOSIS — D72829 Elevated white blood cell count, unspecified: Secondary | ICD-10-CM

## 2015-10-01 DIAGNOSIS — Z9189 Other specified personal risk factors, not elsewhere classified: Secondary | ICD-10-CM

## 2015-10-01 LAB — COMPREHENSIVE METABOLIC PANEL
ALBUMIN: 4.2 g/dL (ref 3.6–5.1)
ALT: 18 U/L (ref 6–29)
AST: 24 U/L (ref 10–35)
Alkaline Phosphatase: 69 U/L (ref 33–130)
BUN: 22 mg/dL (ref 7–25)
CALCIUM: 6.1 mg/dL — AB (ref 8.6–10.4)
CHLORIDE: 95 mmol/L — AB (ref 98–110)
CO2: 23 mmol/L (ref 20–31)
Creat: 1.07 mg/dL — ABNORMAL HIGH (ref 0.50–1.05)
Glucose, Bld: 94 mg/dL (ref 65–99)
POTASSIUM: 5.2 mmol/L (ref 3.5–5.3)
Sodium: 132 mmol/L — ABNORMAL LOW (ref 135–146)
TOTAL PROTEIN: 7.1 g/dL (ref 6.1–8.1)
Total Bilirubin: 1.3 mg/dL — ABNORMAL HIGH (ref 0.2–1.2)

## 2015-10-01 LAB — POCT CBC
GRANULOCYTE PERCENT: 84.7 % — AB (ref 37–80)
HEMATOCRIT: 40.8 % (ref 37.7–47.9)
HEMOGLOBIN: 14.4 g/dL (ref 12.2–16.2)
LYMPH, POC: 3.1 (ref 0.6–3.4)
MCH: 29.3 pg (ref 27–31.2)
MCHC: 35.2 g/dL (ref 31.8–35.4)
MCV: 83.1 fL (ref 80–97)
MID (cbc): 20.8 — AB (ref 0–0.9)
MPV: 9.8 fL (ref 0–99.8)
POC GRANULOCYTE: 20.8 — AB (ref 2–6.9)
POC LYMPH PERCENT: 12.7 %L (ref 10–50)
POC MID %: 2.6 % (ref 0–12)
Platelet Count, POC: 232 10*3/uL (ref 142–424)
RBC: 4.91 M/uL (ref 4.04–5.48)
RDW, POC: 13.4 %
WBC: 24.5 10*3/uL — AB (ref 4.6–10.2)

## 2015-10-01 MED ORDER — ONDANSETRON 4 MG PO TBDP: 4.0000 mg | ORAL_TABLET | Freq: Three times a day (TID) | ORAL | Status: DC | PRN

## 2015-10-01 MED ORDER — METRONIDAZOLE 500 MG PO TABS: 500.0000 mg | ORAL_TABLET | Freq: Three times a day (TID) | ORAL | Status: AC

## 2015-10-01 MED ORDER — IPRATROPIUM BROMIDE 0.03 % NA SOLN: 2.0000 | Freq: Two times a day (BID) | NASAL | Status: DC

## 2015-10-01 MED ORDER — ONDANSETRON 4 MG PO TBDP
4.0000 mg | ORAL_TABLET | Freq: Once | ORAL | Status: AC
  Administered 2015-10-01: 4 mg via ORAL

## 2015-10-01 NOTE — Progress Notes (Signed)
[facility] [address], [place] [id number] [phone]  Date:  10/01/2015   Name:  Connie MarvelHelen Wilson   DOB:  12-14-1955   MRN:  [id number]  PCP:  No PCP Per Patient    Chief Complaint: congestion; Cough; Diarrhea; Chills; and Emesis   History of Present Illness:  This is a 59 y.o. female with PMH HTN, HLD, anxiety who is presenting with cough, nasal congestion, chills, diarrhea and vomiting x 1 week.  States 1 week ago she had a tooth pulled at the dentist "because it was all broken". She states it was not infected and she does not have a heart condition. She was placed on clindamycin TID afterwards. The following day she started to develop diarrhea. She continued the clinda for 48 hours (6 doses) until she decided she could no longer take it. She has been off for 5 days however she is still having watery, dark brown diarrhea 10 times a day. No blood. Volume is decreased now because she hasn't been eating much. Vomited 3 times last night but otherwise no vomiting. She is still feeling nauseated in clinic today. Has not been hydrating as well as she could. Feels she was running a fever last night but did not check temp. She works as LawyerCNA at [employer] and has been exposed to c diff by one of residents. No recent travel. Otherwise no other recent abx use. No fam hx colitis. Has been taking imodium but not helping. At the same time the diarrhea developed she also developed uri symptoms -  cough, nasal congestion, sinus headache. Denies sore throat, otalgia, sob/wheezing. Cough is dry. Has been taking tylenol cold and flu and not helping much.  No dysuria, urinary freq, difficulty urinating. No hx asthma or allergies. Not a smoker (although she smells of smoke) Got flu shot in October.   Review of Systems:  Review of Systems See HPI  Patient Active Problem List   Diagnosis Date Noted  . Elevated glucose 09/29/2012  . HTN (hypertension) 01/06/2012  .  Hyperlipidemia 01/06/2012    Prior to Admission medications   Medication Sig Start Date End Date Taking? Authorizing Provider  Ascorbic Acid (VITAMIN C) 1000 MG tablet Take 1,000 mg by mouth daily.   Yes Historical Provider, MD  calcium citrate (CALCITRATE - DOSED IN MG ELEMENTAL CALCIUM) 950 MG tablet Take 1 tablet by mouth 2 (two) times daily.   Yes Historical Provider, MD  citalopram (CELEXA) 40 MG tablet Take 1 tablet daily for anxiety and depression 07/24/15  Yes Wallis BambergMario Mani, PA-C  clonazePAM (KLONOPIN) 0.5 MG tablet Take 1 tablet at bedtime for sleep.  May also take 1/2 or 1 tablet daily for anxiety as needed 07/24/15  Yes Wallis BambergMario Mani, PA-C  fish oil-omega-3 fatty acids 1000 MG capsule Take 1 g by mouth 2 (two) times daily.    Yes Historical Provider, MD  lisinopril-hydrochlorothiazide (PRINZIDE,ZESTORETIC) 20-25 MG tablet TAKE ONE TABLET BY MOUTH ONCE DAILY. 07/24/15  Yes Wallis BambergMario Mani, PA-C  Multiple Vitamin (MULTIVITAMIN) tablet Take 1 tablet by mouth daily.   Yes Historical Provider, MD  pravastatin (PRAVACHOL) 40 MG tablet TAKE ONE TABLET BY MOUTH ONCE DAILY. 07/24/15  Yes Wallis BambergMario Mani, PA-C    Allergies  Allergen Reactions  . Penicillins Rash    Past Surgical History  Procedure Laterality Date  . Fx. r ankle    . Tubal ligation    . Fracture surgery      Social  History  Substance Use Topics  . Smoking status: Never Smoker   . Smokeless tobacco: Never Used  . Alcohol Use: No    Family History  Problem Relation Age of Onset  . Ovarian cancer Mother   . Cancer Mother     ovarian  . Heart failure Father   . Colon cancer Father   . Hypertension Father   . Cancer Father     prostate  . Hypertension Sister   . Hyperlipidemia Sister   . Diabetes Maternal Grandmother   . Heart attack Maternal Grandfather     Medication list has been reviewed and updated.  Physical Examination:  Physical Exam  Constitutional: She is oriented to person, place, and time. She appears  well-developed and well-nourished. No distress.  HENT:  Head: Normocephalic and atraumatic.  Right Ear: Hearing, tympanic membrane, external ear and ear canal normal.  Left Ear: Hearing, tympanic membrane, external ear and ear canal normal.  Nose: Nose normal.  Mouth/Throat: Uvula is midline, oropharynx is clear and moist and mucous membranes are normal.  Eyes: Conjunctivae and lids are normal. Right eye exhibits no discharge. Left eye exhibits no discharge. No scleral icterus.  Cardiovascular: Normal rate, regular rhythm, normal heart sounds and normal pulses.   No murmur heard. Pulmonary/Chest: Effort normal and breath sounds normal. No respiratory distress. She has no wheezes. She has no rhonchi. She has no rales.  Abdominal: Soft. Normal appearance. There is tenderness (mild, generalized). There is no CVA tenderness.  Musculoskeletal: Normal range of motion.  Lymphadenopathy:       Head (right side): No submental, no submandibular and no tonsillar adenopathy present.       Head (left side): No submental, no submandibular and no tonsillar adenopathy present.    She has no cervical adenopathy.  Neurological: She is alert and oriented to person, place, and time.  Skin: Skin is warm, dry and intact. No lesion and no rash noted.  Psychiatric: She has a normal mood and affect. Her speech is normal and behavior is normal. Thought content normal.    BP 118/74 mmHg  Pulse 88  Temp(Src) 97.8 F (36.6 C) (Oral)  Resp 16  Ht  (1.727 m)  SpO2 98%  Orthostatic VS for the past 24 hrs:  BP- Lying Pulse- Lying BP- Sitting Pulse- Sitting BP- Standing at 0 minutes Pulse- Standing at 0 minutes  10/01/15 1013 110/77 mmHg 75 128/84 mmHg 85 120/79 mmHg 82    Results for orders placed or performed in visit on 10/01/15  POCT CBC  Result Value Ref Range   WBC 24.5 (A) 4.6 - 10.2 K/uL   Lymph, poc 3.1 0.6 - 3.4   POC LYMPH PERCENT 12.7 10 - 50 %L   MID (cbc) 20.8 (A) 0 - 0.9   POC MID % 2.6 0  - 12 %M   POC Granulocyte 20.8 (A) 2 - 6.9   Granulocyte percent 84.7 (A) 37 - 80 %G   RBC 4.91 4.04 - 5.48 M/uL   Hemoglobin 14.4 12.2 - 16.2 g/dL   HCT, POC 47.8 29.5 - 47.9 %   MCV 83.1 80 - 97 fL   MCH, POC 29.3 27 - 31.2 pg   MCHC 35.2 31.8 - 35.4 g/dL   RDW, POC 62.1 %   Platelet Count, POC 232 142 - 424 K/uL   MPV 9.8 0 - 99.8 fL    Assessment and Plan:  1. Diarrhea, unspecified type 2. Nausea and vomiting 3. At risk  for clostridium difficile infection 4. Leukocytosis  Diarrhea 10x/day x 7 days. N/V just in last 12 hours. Exposure to patient with C diff. Recent clindamycin usage after dental visit 1 week ago. No recent travel. CBC with wbc 24.5 and left shift. Normal vitals and appears nontoxic. 2 L NS given by IV with some improvement in symptoms. 4 mg zofran given with improvement in nausea. Diarrhea non-bloody. Exam benign and only mild general soreness on abdominal exam. She will return with stool specimens. Metronidazole TID x 10 days given. She will return for follow up in 3 days or sooner if symptoms change/worsen. Will get repeat CBC at that time. - Ova and parasite examination - Stool culture - Clostridium Difficile by PCR - Fecal Lactoferrin - Comprehensive metabolic panel - POCT CBC - ondansetron (ZOFRAN-ODT) disintegrating tablet 4 mg; Take 1 tablet (4 mg total) by mouth once. - ondansetron (ZOFRAN ODT) 4 MG disintegrating tablet; Take 1 tablet (4 mg total) by mouth every 8 (eight) hours as needed for nausea or vomiting.  Dispense: 20 tablet; Refill: 0 - metroNIDAZOLE (FLAGYL) 500 MG tablet; Take 1 tablet (500 mg total) by mouth 3 (three) times daily.  Dispense: 30 tablet; Refill: 0  5. Nasal congestion URI sx likely unrelated to GI sx. Atrovent BID. Drink plenty of water. Return in 1 week if sx not improving. - ipratropium (ATROVENT) 0.03 % nasal spray; Place 2 sprays into both nostrils 2 (two) times daily.  Dispense: 30 mL; Refill: 0   Quenton Recendez V. Dyke Brackett,  MHS [facility]  10/01/2015

## 2015-10-01 NOTE — Patient Instructions (Addendum)
Take antibiotic three times a day for 10 days. Take probiotic daily and eat lots of yogurt. Drink plenty of water, at least 64 oz of water a day. zofran as needed for nausea Atrovent nasal spray for sinus problems. Return with your stool samples. i will call you with your results Return if any worsening symptoms in the meantime. Return on Monday for recheck.   Food Choices to Help Relieve Diarrhea, Adult When you have diarrhea, the foods you eat and your eating habits are very important. Choosing the right foods and drinks can help relieve diarrhea. Also, because diarrhea can last up to 7 days, you need to replace lost fluids and electrolytes (such as sodium, potassium, and chloride) in order to help prevent dehydration.  WHAT GENERAL GUIDELINES DO I NEED TO FOLLOW?  Slowly drink 1 cup (8 oz) of fluid for each episode of diarrhea. If you are getting enough fluid, your urine will be clear or pale yellow.  Eat starchy foods. Some good choices include white rice, white toast, pasta, low-fiber cereal, baked potatoes (without the skin), saltine crackers, and bagels.  Avoid large servings of any cooked vegetables.  Limit fruit to two servings per day. A serving is  cup or 1 small piece.  Choose foods with less than 2 g of fiber per serving.  Limit fats to less than 8 tsp (38 g) per day.  Avoid fried foods.  Eat foods that have probiotics in them. Probiotics can be found in certain dairy products.  Avoid foods and beverages that may increase the speed at which food moves through the stomach and intestines (gastrointestinal tract). Things to avoid include:  High-fiber foods, such as dried fruit, raw fruits and vegetables, nuts, seeds, and whole grain foods.  Spicy foods and high-fat foods.  Foods and beverages sweetened with high-fructose corn syrup, honey, or sugar alcohols such as xylitol, sorbitol, and mannitol. WHAT FOODS ARE RECOMMENDED? Grains White rice. White, [place], or pita  breads (fresh or toasted), including plain rolls, buns, or bagels. White pasta. Saltine, soda, or graham crackers. Pretzels. Low-fiber cereal. Cooked cereals made with water (such as cornmeal, farina, or cream cereals). Plain muffins. Matzo. Melba toast. Zwieback.  Vegetables Potatoes (without the skin). Strained tomato and vegetable juices. Most well-cooked and canned vegetables without seeds. Tender lettuce. Fruits Cooked or canned applesauce, apricots, cherries, fruit cocktail, grapefruit, peaches, pears, or plums. Fresh bananas, apples without skin, cherries, grapes, cantaloupe, grapefruit, peaches, oranges, or plums.  Meat and Other Protein Products Baked or boiled chicken. Eggs. Tofu. Fish. Seafood. Smooth peanut butter. Ground or well-cooked tender beef, ham, veal, lamb, pork, or poultry.  Dairy Plain yogurt, kefir, and unsweetened liquid yogurt. Lactose-free milk, buttermilk, or soy milk. Plain hard cheese. Beverages Sport drinks. Clear broths. Diluted fruit juices (except prune). Regular, caffeine-free sodas such as ginger ale. Water. Decaffeinated teas. Oral rehydration solutions. Sugar-free beverages not sweetened with sugar alcohols. Other Bouillon, broth, or soups made from recommended foods.  The items listed above may not be a complete list of recommended foods or beverages. Contact your dietitian for more options. WHAT FOODS ARE NOT RECOMMENDED? Grains Whole grain, whole wheat, bran, or rye breads, rolls, pastas, crackers, and cereals. Wild or brown rice. Cereals that contain more than 2 g of fiber per serving. Corn tortillas or taco shells. Cooked or dry oatmeal. Granola. Popcorn. Vegetables Raw vegetables. Cabbage, broccoli, Brussels sprouts, artichokes, baked beans, beet greens, corn, kale, legumes, peas, sweet potatoes, and yams. Potato skins. Cooked spinach and cabbage.  Fruits Dried fruit, including raisins and dates. Raw fruits. Stewed or dried prunes. Fresh apples with  skin, apricots, mangoes, pears, raspberries, and strawberries.  Meat and Other Protein Products Chunky peanut butter. Nuts and seeds. Beans and lentils. Tomasa Blase.  Dairy High-fat cheeses. Milk, chocolate milk, and beverages made with milk, such as milk shakes. Cream. Ice cream. Sweets and Desserts Sweet rolls, doughnuts, and sweet breads. Pancakes and waffles. Fats and Oils Butter. Cream sauces. Margarine. Salad oils. Plain salad dressings. Olives. Avocados.  Beverages Caffeinated beverages (such as coffee, tea, soda, or energy drinks). Alcoholic beverages. Fruit juices with pulp. Prune juice. Soft drinks sweetened with high-fructose corn syrup or sugar alcohols. Other Coconut. Hot sauce. Chili powder. Mayonnaise. Gravy. Cream-based or milk-based soups.  The items listed above may not be a complete list of foods and beverages to avoid. Contact your dietitian for more information. WHAT SHOULD I DO IF I BECOME DEHYDRATED? Diarrhea can sometimes lead to dehydration. Signs of dehydration include dark urine and dry mouth and skin. If you think you are dehydrated, you should rehydrate with an oral rehydration solution. These solutions can be purchased at pharmacies, retail stores, or online.  Drink -1 cup (120-240 mL) of oral rehydration solution each time you have an episode of diarrhea. If drinking this amount makes your diarrhea worse, try drinking smaller amounts more often. For example, drink 1-3 tsp (5-15 mL) every 5-10 minutes.  A general rule for staying hydrated is to drink 1-2 L of fluid per day. Talk to your health care provider about the specific amount you should be drinking each day. Drink enough fluids to keep your urine clear or pale yellow.   This information is not intended to replace advice given to you by your health care provider. Make sure you discuss any questions you have with your health care provider.   Document Released: 12/17/2003 Document Revised: 10/17/2014 Document  Reviewed: 08/19/2013 Elsevier Interactive Patient Education [employer]

## 2015-10-01 NOTE — Telephone Encounter (Signed)
Called pt and lmom (she said I could leave detailed messages on her machine) - she has electrolyte disturbances which is not abnormal for diarrhea. Advised she hydrate with gatorade. I want her to return tomorrow 12/23 or early Saturday 12/24 for recheck and to repeat CMP and CBC. I let her know this on her machine. Return sooner if sx worsening.

## 2015-10-03 ENCOUNTER — Ambulatory Visit (INDEPENDENT_AMBULATORY_CARE_PROVIDER_SITE_OTHER): Payer: 59 | Admitting: Internal Medicine

## 2015-10-03 VITALS — BP 110/68 | HR 93 | Temp 98.2°F | Resp 18

## 2015-10-03 DIAGNOSIS — E871 Hypo-osmolality and hyponatremia: Secondary | ICD-10-CM | POA: Diagnosis not present

## 2015-10-03 DIAGNOSIS — D72829 Elevated white blood cell count, unspecified: Secondary | ICD-10-CM | POA: Diagnosis not present

## 2015-10-03 DIAGNOSIS — R748 Abnormal levels of other serum enzymes: Secondary | ICD-10-CM

## 2015-10-03 DIAGNOSIS — R7989 Other specified abnormal findings of blood chemistry: Secondary | ICD-10-CM

## 2015-10-03 DIAGNOSIS — R112 Nausea with vomiting, unspecified: Secondary | ICD-10-CM | POA: Diagnosis not present

## 2015-10-03 DIAGNOSIS — R197 Diarrhea, unspecified: Secondary | ICD-10-CM

## 2015-10-03 LAB — POCT CBC
GRANULOCYTE PERCENT: 75.4 % (ref 37–80)
HEMATOCRIT: 37.8 % (ref 37.7–47.9)
Hemoglobin: 12.9 g/dL (ref 12.2–16.2)
Lymph, poc: 1.8 (ref 0.6–3.4)
MCH: 28.9 pg (ref 27–31.2)
MCHC: 34.3 g/dL (ref 31.8–35.4)
MCV: 84.2 fL (ref 80–97)
MID (CBC): 0.6 (ref 0–0.9)
MPV: 9.1 fL (ref 0–99.8)
PLATELET COUNT, POC: 202 10*3/uL (ref 142–424)
POC GRANULOCYTE: 7.2 — AB (ref 2–6.9)
POC LYMPH %: 18.4 % (ref 10–50)
POC MID %: 6.2 %M (ref 0–12)
RBC: 4.49 M/uL (ref 4.04–5.48)
RDW, POC: 13.6 %
WBC: 9.6 10*3/uL (ref 4.6–10.2)

## 2015-10-03 LAB — COMPREHENSIVE METABOLIC PANEL
ALBUMIN: 3.4 g/dL — AB (ref 3.6–5.1)
ALT: 15 U/L (ref 6–29)
AST: 15 U/L (ref 10–35)
Alkaline Phosphatase: 63 U/L (ref 33–130)
BUN: 8 mg/dL (ref 7–25)
CALCIUM: 8.7 mg/dL (ref 8.6–10.4)
CHLORIDE: 104 mmol/L (ref 98–110)
CO2: 28 mmol/L (ref 20–31)
Creat: 0.71 mg/dL (ref 0.50–1.05)
GLUCOSE: 106 mg/dL — AB (ref 65–99)
POTASSIUM: 3.3 mmol/L — AB (ref 3.5–5.3)
Sodium: 139 mmol/L (ref 135–146)
Total Bilirubin: 0.5 mg/dL (ref 0.2–1.2)
Total Protein: 5.9 g/dL — ABNORMAL LOW (ref 6.1–8.1)

## 2015-10-03 NOTE — Progress Notes (Signed)
Subjective:  By signing my name below, I, Rawaa Al Rifaie, attest that this documentation has been prepared under the direction and in the presence of Ellamae Siaobert Yasmen Cortner, MD.  Watt Climesawaa Al Rifaie, Medical Scribe. 10/03/2015.  9:37 AM.  I have completed the patient encounter in its entirety as documented by the scribe, with editing by me where necessary. Halia Franey P. Merla Richesoolittle, M.D.   Patient ID: Connie Wilson, female    DOB: 1956/04/20, 59 y.o.   MRN: [id number]  Chief Complaint  Patient presents with  . Follow-up    diarrhea    HPI HPI Comments: Connie MarvelHelen Daoust is a 59 y.o. female who presents to [facility] for [facility] follow up  Pt was seen here at the office yesterday by Higinio RogerNicol Bush, PA-C for diarrhea. 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 Pt had IV fluids here at the office and was prescribed Metronidazole TID X10 days. Pt also presented yesterday with nasal congestion, and was prescribed atrovent.   Today, pt states that she still presented with one episode of diarrhea this morning, however non last night. She also reports symptoms of sore throat, horace ness in voice, and cough that has now resolved.    Patient Active Problem List   Diagnosis Date Noted  . Elevated glucose 09/29/2012  . HTN (hypertension) 01/06/2012  . Hyperlipidemia 01/06/2012   Past Medical History  Diagnosis Date  . Hyperlipidemia   . Hypertension   . Anxiety   . Depression   . Allergy   . Arthritis    Past Surgical History  Procedure Laterality Date  . Fx. r ankle    . Tubal ligation    . Fracture surgery     Allergies  Allergen Reactions  . Penicillins Rash   Prior to Admission medications   Medication Sig Start Date End Date Taking? Authorizing Provider  Ascorbic Acid (VITAMIN C) 1000 MG tablet Take 1,000 mg by mouth daily.   Yes  Historical Provider, MD  calcium citrate (CALCITRATE - DOSED IN MG ELEMENTAL CALCIUM) 950 MG tablet Take 1 tablet by mouth 2 (two) times daily.   Yes Historical Provider, MD  citalopram (CELEXA) 40 MG tablet Take 1 tablet daily for anxiety and depression 07/24/15  Yes Wallis BambergMario Mani, PA-C  clonazePAM (KLONOPIN) 0.5 MG tablet Take 1 tablet at bedtime for sleep.  May also take 1/2 or 1 tablet daily for anxiety as needed 07/24/15  Yes Wallis BambergMario Mani, PA-C  fish oil-omega-3 fatty acids 1000 MG capsule Take 1 g by mouth 2 (two) times daily.    Yes Historical Provider, MD  ipratropium (ATROVENT) 0.03 % nasal spray Place 2 sprays into both nostrils 2 (two) times daily. 10/01/15  Yes Lanier ClamNicole Bush V, PA-C  lisinopril-hydrochlorothiazide (PRINZIDE,ZESTORETIC) 20-25 MG tablet TAKE ONE TABLET BY MOUTH ONCE DAILY. 07/24/15  Yes Wallis BambergMario Mani, PA-C  metroNIDAZOLE (FLAGYL) 500 MG tablet Take 1 tablet (500 mg total) by mouth 3 (three) times daily. 10/01/15 10/11/15 Yes Lanier ClamNicole Bush V, PA-C  Multiple Vitamin (MULTIVITAMIN) tablet Take 1 tablet by mouth daily.   Yes Historical Provider, MD  ondansetron (ZOFRAN ODT) 4 MG disintegrating tablet Take 1 tablet (4 mg total) by mouth every 8 (eight) hours as needed for nausea or vomiting. 10/01/15  Yes Lanier ClamNicole Bush V, PA-C  pravastatin (PRAVACHOL) 40 MG tablet TAKE ONE TABLET BY MOUTH  ONCE DAILY. 07/24/15  Yes Wallis Bamberg, PA-C   Social History   Social History  . Marital Status: Divorced    Spouse Name: N/A  . Number of Children: N/A  . Years of Education: N/A   Occupational History  . Not on file.   Social History Main Topics  . Smoking status: Never Smoker   . Smokeless tobacco: Never Used  . Alcohol Use: No  . Drug Use: No  . Sexual Activity: Not Currently    Birth Control/ Protection: Post-menopausal   Other Topics Concern  . Not on file   Social History Narrative     Review of Systems  HENT: Positive for sore throat and voice change.   Respiratory: Negative for  cough.   Gastrointestinal: Positive for diarrhea.      Objective:   Physical Exam  Constitutional: She is oriented to person, place, and time. She appears well-developed and well-nourished. No distress.  HENT:  Head: Normocephalic and atraumatic.  Nose: Nose normal.  Mouth/Throat: Oropharynx is clear and moist. No oropharyngeal exudate.  No nodes.  Eyes: Conjunctivae and EOM are normal. Pupils are equal, round, and reactive to light.  Neck: Neck supple.  Cardiovascular: Normal rate.   Pulmonary/Chest: Effort normal and breath sounds normal. No respiratory distress. She has no wheezes.  Neurological: She is alert and oriented to person, place, and time. No cranial nerve deficit.  Skin: Skin is warm and dry.  Psychiatric: She has a normal mood and affect. Her behavior is normal.  Nursing note and vitals reviewed.   Results for orders placed or performed in visit on 10/03/15  POCT CBC  Result Value Ref Range   WBC 9.6 4.6 - 10.2 K/uL   Lymph, poc 1.8 0.6 - 3.4   POC LYMPH PERCENT 18.4 10 - 50 %L   MID (cbc) 0.6 0 - 0.9   POC MID % 6.2 0 - 12 %M   POC Granulocyte 7.2 (A) 2 - 6.9   Granulocyte percent 75.4 37 - 80 %G   RBC 4.49 4.04 - 5.48 M/uL   Hemoglobin 12.9 12.2 - 16.2 g/dL   HCT, POC 40.9 81.1 - 47.9 %   MCV 84.2 80 - 97 fL   MCH, POC 28.9 27 - 31.2 pg   MCHC 34.3 31.8 - 35.4 g/dL   RDW, POC 91.4 %   Platelet Count, POC 202 142 - 424 K/uL   MPV 9.1 0 - 99.8 fL    BP 110/68 mmHg  Pulse 93  Temp(Src) 98.2 F (36.8 C)  Resp 18  SpO2 98%     Assessment & Plan:  Leucocytosis - responded to rx  Hypocalcemia - Plan: Comprehensive metabolic panel to reck  Elevated serum creatinine - Plan: Comprehensive metabolic panel to reck  Hyponatremia - Plan: Comprehensive metabolic panel to reck  Diarrhea, unspecified type---resolving on flagyl//brought stool for testing today  Nausea and vomiting, intractability of vomiting not specified, unspecified vomiting type---no  more vomiting  F/u if gets worse Notify labs

## 2015-10-05 ENCOUNTER — Telehealth: Payer: Self-pay | Admitting: Physician Assistant

## 2015-10-05 LAB — CLOSTRIDIUM DIFFICILE BY PCR: CDIFFPCR: DETECTED — AB

## 2015-10-05 LAB — STOOL CULTURE

## 2015-10-05 NOTE — Telephone Encounter (Signed)
Informed pt of positive C diff result. She is getting better on metronidazole. Last episode of diarrhea 8 hours ago. I advised she stay home from work until no longer having diarrhea. She voiced understanding.

## 2015-10-06 LAB — FECAL LACTOFERRIN, QUANT: Lactoferrin: POSITIVE

## 2015-10-06 NOTE — Progress Notes (Signed)
Agree with A/P. Dr Chelise Hanger 

## 2015-10-07 LAB — OVA AND PARASITE EXAMINATION: OP: NONE SEEN

## 2015-10-20 ENCOUNTER — Emergency Department (HOSPITAL_COMMUNITY)
Admission: EM | Admit: 2015-10-20 | Discharge: 2015-10-21 | Disposition: A | Discharge status: Home / Self Care | Payer: 59 | Attending: Emergency Medicine | Admitting: Emergency Medicine

## 2015-10-20 ENCOUNTER — Encounter (HOSPITAL_COMMUNITY): Payer: Self-pay | Admitting: *Deleted

## 2015-10-20 ENCOUNTER — Emergency Department (HOSPITAL_COMMUNITY): Payer: 59

## 2015-10-20 DIAGNOSIS — E785 Hyperlipidemia, unspecified: Secondary | ICD-10-CM | POA: Diagnosis not present

## 2015-10-20 DIAGNOSIS — I10 Essential (primary) hypertension: Secondary | ICD-10-CM | POA: Diagnosis not present

## 2015-10-20 DIAGNOSIS — F419 Anxiety disorder, unspecified: Secondary | ICD-10-CM | POA: Insufficient documentation

## 2015-10-20 DIAGNOSIS — R1032 Left lower quadrant pain: Secondary | ICD-10-CM | POA: Diagnosis not present

## 2015-10-20 DIAGNOSIS — Z880 Allergy status to penicillin: Secondary | ICD-10-CM | POA: Insufficient documentation

## 2015-10-20 DIAGNOSIS — Z79899 Other long term (current) drug therapy: Secondary | ICD-10-CM | POA: Diagnosis not present

## 2015-10-20 DIAGNOSIS — F329 Major depressive disorder, single episode, unspecified: Secondary | ICD-10-CM | POA: Diagnosis not present

## 2015-10-20 DIAGNOSIS — Z8739 Personal history of other diseases of the musculoskeletal system and connective tissue: Secondary | ICD-10-CM | POA: Insufficient documentation

## 2015-10-20 DIAGNOSIS — R1030 Lower abdominal pain, unspecified: Secondary | ICD-10-CM | POA: Diagnosis present

## 2015-10-20 DIAGNOSIS — R109 Unspecified abdominal pain: Secondary | ICD-10-CM

## 2015-10-20 LAB — COMPREHENSIVE METABOLIC PANEL
ALBUMIN: 4.2 g/dL (ref 3.5–5.0)
ALK PHOS: 64 U/L (ref 38–126)
ALT: 24 U/L (ref 14–54)
AST: 24 U/L (ref 15–41)
Anion gap: 13 (ref 5–15)
BILIRUBIN TOTAL: 1.1 mg/dL (ref 0.3–1.2)
BUN: 15 mg/dL (ref 6–20)
CHLORIDE: 97 mmol/L — AB (ref 101–111)
CO2: 27 mmol/L (ref 22–32)
CREATININE: 0.88 mg/dL (ref 0.44–1.00)
Calcium: 9.7 mg/dL (ref 8.9–10.3)
GFR calc Af Amer: >60 mL/min (ref >60)
Glucose, Bld: 137 mg/dL — ABNORMAL HIGH (ref 65–99)
Potassium: 3 mmol/L — ABNORMAL LOW (ref 3.5–5.1)
Sodium: 137 mmol/L (ref 135–145)
Total Protein: 7.7 g/dL (ref 6.5–8.1)

## 2015-10-20 LAB — CBC
HCT: 40.7 % (ref 36.0–46.0)
Hemoglobin: 14.2 g/dL (ref 12.0–15.0)
MCH: 29 pg (ref 26.0–34.0)
MCHC: 34.9 g/dL (ref 30.0–36.0)
MCV: 83.1 fL (ref 78.0–100.0)
PLATELETS: 308 10*3/uL (ref 150–400)
RBC: 4.9 MIL/uL (ref 3.87–5.11)
RDW: 14 % (ref 11.5–15.5)
WBC: 20.9 10*3/uL — ABNORMAL HIGH (ref 4.0–10.5)

## 2015-10-20 LAB — LIPASE, BLOOD: Lipase: 51 U/L (ref 11–51)

## 2015-10-20 LAB — URINALYSIS, ROUTINE W REFLEX MICROSCOPIC
Bilirubin Urine: NEGATIVE
GLUCOSE, UA: NEGATIVE mg/dL
HGB URINE DIPSTICK: NEGATIVE
KETONES UR: NEGATIVE mg/dL
Leukocytes, UA: NEGATIVE
Nitrite: NEGATIVE
PROTEIN: NEGATIVE mg/dL
Specific Gravity, Urine: 1.014 (ref 1.005–1.030)
pH: 6 (ref 5.0–8.0)

## 2015-10-20 MED ORDER — ONDANSETRON HCL 4 MG/2ML IJ SOLN
4.0000 mg | Freq: Once | INTRAMUSCULAR | Status: AC
  Administered 2015-10-20: 4 mg via INTRAVENOUS
  Filled 2015-10-20: qty 2

## 2015-10-20 MED ORDER — SODIUM CHLORIDE 0.9 % IV BOLUS (SEPSIS)
1000.0000 mL | Freq: Once | INTRAVENOUS | Status: AC
  Administered 2015-10-20: 1000 mL via INTRAVENOUS

## 2015-10-20 MED ORDER — IOHEXOL 300 MG/ML  SOLN
25.0000 mL | Freq: Once | INTRAMUSCULAR | Status: AC | PRN
  Administered 2015-10-20: 25 mL via ORAL

## 2015-10-20 NOTE — ED Provider Notes (Signed)
CSN: [id number]     Arrival date & time 10/20/15  2111 History   First MD Initiated Contact with Patient 10/20/15 2210     Chief Complaint  Patient presents with  . Abdominal Pain   HPI Pt had c diff a couple of weeks ago.  The patient was tested at an urgent care. She took an abx (she does not remember the name).  She was recovering until the last couple of days where she started having mucoid discharge from the rectum and she has been having pressure in her lower abdomen.  She has been nauseated.  No vomiting.  The pain in her lower abdomen is moderate more on the left side. Past Medical History  Diagnosis Date  . Hyperlipidemia   . Hypertension   . Anxiety   . Depression   . Allergy   . Arthritis    Past Surgical History  Procedure Laterality Date  . Fx. r ankle    . Tubal ligation    . Fracture surgery     Family History  Problem Relation Age of Onset  . Ovarian cancer Mother   . Cancer Mother     ovarian  . Heart failure Father   . Colon cancer Father   . Hypertension Father   . Cancer Father     prostate  . Hypertension Sister   . Hyperlipidemia Sister   . Diabetes Maternal Grandmother   . Heart attack Maternal Grandfather    Social History  Substance Use Topics  . Smoking status: Never Smoker   . Smokeless tobacco: Never Used  . Alcohol Use: No   OB History    No data available     Review of Systems  All other systems reviewed and are negative.     Allergies  Penicillins  Home Medications   Prior to Admission medications   Medication Sig Start Date End Date Taking? Authorizing Provider  Ascorbic Acid (VITAMIN C) 1000 MG tablet Take 1,000 mg by mouth daily.   Yes Historical Provider, MD  calcium citrate (CALCITRATE - DOSED IN MG ELEMENTAL CALCIUM) 950 MG tablet Take 1 tablet by mouth 2 (two) times daily.   Yes Historical Provider, MD  citalopram (CELEXA) 40 MG tablet Take 1 tablet daily for anxiety and depression Patient taking differently: Take  40 mg by mouth daily. Take daily for anxiety and depression 07/24/15  Yes Wallis Bamberg, PA-C  clonazePAM (KLONOPIN) 0.5 MG tablet Take 1 tablet at bedtime for sleep.  May also take 1/2 or 1 tablet daily for anxiety as needed Patient taking differently: Take 0.5 mg by mouth at bedtime. May also take 1/2 or 1 tablet daily for anxiety as needed 07/24/15  Yes Wallis Bamberg, PA-C  Doxylamine Succinate, Sleep, (SLEEP AID PO) Take 3 tablets by mouth at bedtime.   Yes Historical Provider, MD  fish oil-omega-3 fatty acids 1000 MG capsule Take 1 g by mouth 2 (two) times daily.    Yes Historical Provider, MD  ipratropium (ATROVENT) 0.03 % nasal spray Place 2 sprays into both nostrils 2 (two) times daily. 10/01/15  Yes Lanier Clam V, PA-C  lisinopril-hydrochlorothiazide (PRINZIDE,ZESTORETIC) 20-25 MG tablet TAKE ONE TABLET BY MOUTH ONCE DAILY. 07/24/15  Yes Wallis Bamberg, PA-C  Multiple Vitamin (MULTIVITAMIN) tablet Take 1 tablet by mouth daily.   Yes Historical Provider, MD  ondansetron (ZOFRAN ODT) 4 MG disintegrating tablet Take 1 tablet (4 mg total) by mouth every 8 (eight) hours as needed for nausea or vomiting. 10/01/15  Yes Lanier ClamNicole Bush V, PA-C  pravastatin (PRAVACHOL) 40 MG tablet TAKE ONE TABLET BY MOUTH ONCE DAILY. Patient taking differently: Take 40 mg by mouth daily.  07/24/15  Yes Mario Mani, PA-C   BP 118/70 mmHg  Pulse 80  Temp(Src) 98.1 F (36.7 C) (Oral)  Resp 13  SpO2 95% Physical Exam  Constitutional: She appears well-developed and well-nourished. No distress.  HENT:  Head: Normocephalic and atraumatic.  Right Ear: External ear normal.  Left Ear: External ear normal.  Eyes: Conjunctivae are normal. Right eye exhibits no discharge. Left eye exhibits no discharge. No scleral icterus.  Neck: Neck supple. No tracheal deviation present.  Cardiovascular: Normal rate, regular rhythm and intact distal pulses.   Pulmonary/Chest: Effort normal and breath sounds normal. No stridor. No respiratory  distress. She has no wheezes. She has no rales.  Abdominal: Soft. Bowel sounds are normal. She exhibits no distension. There is tenderness in the left lower quadrant. There is no rebound and no guarding. No hernia.  Musculoskeletal: She exhibits no edema or tenderness.  Neurological: She is alert. She has normal strength. No cranial nerve deficit (no facial droop, extraocular movements intact, no slurred speech) or sensory deficit. She exhibits normal muscle tone. She displays no seizure activity. Coordination normal.  Skin: Skin is warm and dry. No rash noted.  Psychiatric: She has a normal mood and affect.  Nursing note and vitals reviewed.   ED Course  Procedures (including critical care time) Labs Review Labs Reviewed  COMPREHENSIVE METABOLIC PANEL - Abnormal; Notable for the following:    Potassium 3.0 (*)    Chloride 97 (*)    Glucose, Bld 137 (*)    All other components within normal limits  CBC - Abnormal; Notable for the following:    WBC 20.9 (*)    All other components within normal limits  C DIFFICILE QUICK SCREEN W PCR REFLEX  LIPASE, BLOOD  URINALYSIS, ROUTINE W REFLEX MICROSCOPIC (NOT [facility])      MDM   Pt has a recent diagnosis of c diff.  Sx have returned.  WBC count is significantly elevated. Will send off c diff studies if she is able to provide a sample.  CT scan to evaluate further.   Linwood DibblesJon Erikah Thumm, MD 10/21/15 [phone]

## 2015-10-20 NOTE — ED Notes (Signed)
Patient was requested to urinate

## 2015-10-20 NOTE — ED Notes (Signed)
Pt is not able to urinate at this. Pt is aware that urine is needed

## 2015-10-20 NOTE — ED Notes (Signed)
Pt states that she recently had C-Diff and began having abd pain yesterday; pt states that she has a lot of rectal pressure and constantly has the urge to go to the bathroom; pt states that her stool is soft but not liquid or diarrhea; pt c/o feeling weak and tired; pt states that she is having lower abd pain radiating to her back; pt states that the pain is pressure and deep; pt reports family hx of colon and ovarian cancer and has not followed up with her PCP for her colonoscopy

## 2015-10-21 ENCOUNTER — Encounter (HOSPITAL_COMMUNITY): Payer: Self-pay

## 2015-10-21 MED ORDER — VANCOMYCIN HCL 125 MG PO CAPS: 250.0000 mg | ORAL_CAPSULE | Freq: Four times a day (QID) | ORAL | Status: DC

## 2015-10-21 MED ORDER — IOHEXOL 300 MG/ML  SOLN
100.0000 mL | Freq: Once | INTRAMUSCULAR | Status: AC | PRN
  Administered 2015-10-21: 100 mL via INTRAVENOUS

## 2015-10-21 MED ORDER — MORPHINE SULFATE (PF) 4 MG/ML IV SOLN
4.0000 mg | Freq: Once | INTRAVENOUS | Status: AC
  Administered 2015-10-21: 4 mg via INTRAVENOUS
  Filled 2015-10-21: qty 1

## 2015-10-21 NOTE — Progress Notes (Signed)
EDCM received phone call from patient reporting cannot afford Vancomycin prescription.  EDCM called and spoke to patient.  Patient reports she used her "[facility] card" and medication will cost 500 dollars.  EDCM called [facility] on [address] who reports the medication was run through her insurance company through [employer], but patient took her prescription back so they are unable to process it.  EDCM called [facility] to perform benefits check.  [facility], patient's pharmacy vendor is now [facility] [phone] and copay for prescription will be 30 dollars.  Patient may not have given pharmacy her new insurance card.  EDCM called [facility] and informed them of this information.  [facility] requesting new insurance card.  EDCM called patient who confirms she has a new [employer] insurance card.  EDCM informed patient to bring her new insurance card with her prescription back to pharmacy, informed patient of copay of 30 dollars.  Patient thankful for assistance.  No further EDCM needs at this time.

## 2015-10-21 NOTE — Discharge Instructions (Signed)
See the [facility] in 7-10 days, See the GI doctor as planned for your routine surveillance.  Clostridium Difficile Infection Clostridium difficile (C. difficile or C. diff) is a bacterium normally found in the intestinal tract or colon. C. difficile infection causes diarrhea and sometimes a severe disease called pseudomembranous colitis (C. difficile colitis). C. difficile colitis can damage the lining of the colon or cause the colon to become very large (toxic megacolon). Older adults and people with certain medical conditions have a greater risk of getting C. difficile infections. CAUSES The balance of bacteria in your colon can change when you are sick, especially when taking antibiotic medicine. Taking antibiotics may allow the C. difficile to grow, multiply, and make a toxin that causes C. difficile infection.  SYMPTOMS  Diarrhea.  Fever.  Fatigue.  Loss of appetite.  Nausea.  Abdominal swelling, pain, or tenderness.  Dehydration. DIAGNOSIS Your health care provider may suspect C. difficile infection based on your symptoms and if you have taken antibiotics recently. Your health care provider may also order:  A lab test that can detect the toxin in your stool.  A sigmoidoscopy or colonoscopy to look at the appearance of your colon. These procedures involve passing an instrument through your rectum to look at the inside of your colon. Your health care provider will help determine if these tests are necessary. TREATMENT Treatment may include:  Taking antibiotics that keep C. difficile from growing.  Stopping the antibiotics you were on before the C. difficile infection began. Only do this if instructed to do so by your health care provider.  IV fluids and correction of electrolyte imbalance.  Surgery to remove the infected part of the intestines. This is rare. HOME CARE INSTRUCTIONS  Drink enough fluids to keep your urine clear or pale yellow. Avoid milk, caffeine, and  alcohol.  Ask your health care provider for specific rehydration instructions.  Eat small, frequent meals rather than large meals.  Take your antibiotics as directed. Finish them even if you start to feel better.  Do not use medicines to slow diarrhea. This could delay healing or cause problems.  Wash your hands thoroughly after using the bathroom and before preparing food. Make sure people who live with you wash their hands often, too.  Clean all surfaces with a product that contains chlorine bleach. SEEK MEDICAL CARE IF:  Your diarrhea lasts longer than expected or comes back after you finish your antibiotic medicine for the C. difficile infection.  You have trouble staying hydrated.  You have a fever. SEEK IMMEDIATE MEDICAL CARE IF:  You have increasing abdominal pain or tenderness.  You have blood in your stools, or your stools look dark black and tarry.  You cannot eat or drink without vomiting.   This information is not intended to replace advice given to you by your health care provider. Make sure you discuss any questions you have with your health care provider.   Document Released: 07/06/2005 Document Revised: 10/17/2014 Document Reviewed: 03/30/2015 Elsevier Interactive Patient Education [employer]2016 Elsevier Inc.

## 2015-10-29 ENCOUNTER — Ambulatory Visit (INDEPENDENT_AMBULATORY_CARE_PROVIDER_SITE_OTHER): Payer: 59 | Admitting: Family Medicine

## 2015-10-29 VITALS — BP 108/74 | HR 94 | Temp 97.8°F | Resp 18 | Ht 68.0 in | Wt 195.0 lb

## 2015-10-29 DIAGNOSIS — A09 Infectious gastroenteritis and colitis, unspecified: Secondary | ICD-10-CM

## 2015-10-29 MED ORDER — METRONIDAZOLE 500 MG PO TABS: 500.0000 mg | ORAL_TABLET | Freq: Two times a day (BID) | ORAL | Status: DC

## 2015-10-29 NOTE — Patient Instructions (Signed)
Stop the Activia.  What I want you to do is take either Culturelle or Align 3 times a day.  If the diarrhea subsides with these changes, we may be able to let you return to work next week

## 2015-10-29 NOTE — Progress Notes (Signed)
By signing my name below, I, Connie Wilson, attest that this documentation has been prepared under the direction and in the presence of Connie Sidle, MD. Electronically Signed: Stann Wilson, Scribe. 10/29/2015 , 8:23 AM .  Patient was seen in room 10 .   Patient ID: Connie Wilson MRN: [id number], DOB: 11/02/1955, 60 y.o. Date of Encounter: 10/29/2015  Primary Physician: No PCP Per Patient  Chief Complaint:  Chief Complaint  Patient presents with   Follow-up    c diff, needs to return to work if able    Depression    see screen    HPI:  Connie Wilson is a 60 y.o. female who presents to [facility] for follow up on C. Diff.  Pt stated that it started before Christmas (12/25). She took last 2 vancomycin. Prior to finishing her medication, she would have diarrhea after she ate. After her regime, she has diarrhea occasionally. She also took flagyl previously and still taking probiotics. She denies blood in stool.   She had abd CT done at the ED, reported normal.  Her father died from colon cancer.   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   Past Medical History  Diagnosis Date   Hyperlipidemia    Hypertension    Anxiety    Depression    Allergy    Arthritis      Home Meds: Prior to Admission medications   Medication Sig Start Date End Date Taking? Authorizing Provider  Ascorbic Acid (VITAMIN C) 1000 MG tablet Take 1,000 mg by mouth daily.   Yes Historical Provider, MD  calcium citrate (CALCITRATE - DOSED IN MG ELEMENTAL CALCIUM) 950 MG tablet Take 1 tablet by mouth 2 (two) times daily.   Yes Historical Provider, MD  citalopram (CELEXA) 40 MG tablet Take 1 tablet daily for anxiety and depression Patient taking differently: Take 40 mg by mouth daily. Take daily for anxiety and depression 07/24/15  Yes Wallis Bamberg, PA-C  clonazePAM (KLONOPIN) 0.5 MG tablet Take 1 tablet at bedtime for sleep.  May also take 1/2 or 1 tablet daily  for anxiety as needed Patient taking differently: Take 0.5 mg by mouth at bedtime. May also take 1/2 or 1 tablet daily for anxiety as needed 07/24/15  Yes Wallis Bamberg, PA-C  Doxylamine Succinate, Sleep, (SLEEP AID PO) Take 3 tablets by mouth at bedtime.   Yes Historical Provider, MD  fish oil-omega-3 fatty acids 1000 MG capsule Take 1 g by mouth 2 (two) times daily.    Yes Historical Provider, MD  ipratropium (ATROVENT) 0.03 % nasal spray Place 2 sprays into both nostrils 2 (two) times daily. 10/01/15  Yes Lanier Clam V, PA-C  lisinopril-hydrochlorothiazide (PRINZIDE,ZESTORETIC) 20-25 MG tablet TAKE ONE TABLET BY MOUTH ONCE DAILY. 07/24/15  Yes Wallis Bamberg, PA-C  Multiple Vitamin (MULTIVITAMIN) tablet Take 1 tablet by mouth daily.   Yes Historical Provider, MD  pravastatin (PRAVACHOL) 40 MG tablet TAKE ONE TABLET BY MOUTH ONCE DAILY. Patient taking differently: Take 40 mg by mouth daily.  07/24/15  Yes Wallis Bamberg, PA-C  vancomycin (VANCOCIN) 125 MG capsule Take 2 capsules (250 mg total) by mouth 4 (four) times daily. 10/21/15  Yes Ankit Rhunette Croft, MD  ondansetron (ZOFRAN ODT) 4 MG disintegrating tablet Take 1 tablet (4 mg total) by mouth every 8 (eight) hours as needed for nausea or vomiting. Patient not taking: Reported on 10/29/2015 10/01/15   Dorna Leitz, PA-C    Allergies:  Allergies  Allergen  Reactions   Penicillins Rash    Has patient had a PCN reaction causing immediate rash, facial/tongue/throat swelling, SOB or lightheadedness with hypotension: No (pt  Did have rash) Has patient had a PCN reaction causing severe rash involving mucus membranes or skin necrosis: No Has patient had a PCN reaction that required hospitalization: No Has patient had a PCN reaction occurring within the last 10 years:No If all of the above answers are "NO", then may proceed with Cephalosporin use.     Social History   Social History   Marital Status: Divorced    Spouse Name: N/A   Number of Children:  N/A   Years of Education: N/A   Occupational History   Not on file.   Social History Main Topics   Smoking status: Never Smoker    Smokeless tobacco: Never Used   Alcohol Use: No   Drug Use: No   Sexual Activity: Not Currently    Birth Control/ Protection: Post-menopausal   Other Topics Concern   Not on file   Social History Narrative     Review of Systems: Constitutional: negative for fever, chills, night sweats, weight changes, or fatigue  HEENT: negative for vision changes, hearing loss, congestion, rhinorrhea, ST, epistaxis, or sinus pressure Cardiovascular: negative for chest pain or palpitations Respiratory: negative for hemoptysis, wheezing, shortness of breath, or cough Abdominal: negative for nausea, vomiting, or constipation; positive for abd pain, diarrhea Dermatological: negative for rash Neurologic: negative for headache, dizziness, or syncope All other systems reviewed and are otherwise negative with the exception to those above and in the HPI.  Physical Exam: Blood pressure 108/74, pulse 94, temperature 97.8 F (36.6 C), temperature source Oral, resp. rate 18, height  (1.727 m), weight 195 lb (88.451 kg), SpO2 98 %., Body mass index is 29.66 kg/(m^2). General: Well developed, well nourished, in no acute distress. Head: Normocephalic, atraumatic, eyes without discharge, sclera non-icteric, nares are without discharge. Bilateral auditory canals clear, TM's are without perforation, pearly grey and translucent with reflective cone of light bilaterally. Oral cavity moist, posterior pharynx without exudate, erythema, peritonsillar abscess, or post nasal drip.  Neck: Supple. No thyromegaly. Full ROM. No lymphadenopathy. Lungs: Clear bilaterally to auscultation without wheezes, rales, or rhonchi. Breathing is unlabored. Heart: RRR with S1 S2. No murmurs, rubs, or gallops appreciated. Abdomen: Soft. No hepatomegaly. No rebound/guarding. No obvious abdominal  masses. Hyperactive bowel sounds, Tender over LLQ Msk:  Strength and tone normal for age. Extremities/Skin: Warm and dry. No clubbing or cyanosis. No edema. No rashes or suspicious lesions. Neuro: Alert and oriented X 3. Moves all extremities spontaneously. Gait is normal. CNII-XII grossly in tact. Psych:  Responds to questions appropriately with a normal affect.     ASSESSMENT AND PLAN:  60 y.o. 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    ICD-9-CM ICD-10-CM   1. Diarrhea of infectious origin 009.3 A09 metroNIDAZOLE (FLAGYL) 500 MG tablet     Ambulatory referral to Gastroenterology     Gastrointestinal Pathogen Panel PCR       Signed, Connie Sidle, MD 10/29/2015 8:23 AM

## 2015-10-30 ENCOUNTER — Telehealth: Payer: Self-pay

## 2015-10-30 ENCOUNTER — Encounter: Payer: Self-pay | Admitting: Gastroenterology

## 2015-10-30 LAB — GASTROINTESTINAL PATHOGEN PANEL PCR
C. difficile Tox A/B, PCR: NEGATIVE
Campylobacter, PCR: NEGATIVE
Cryptosporidium, PCR: NEGATIVE
E coli (ETEC) LT/ST PCR: NEGATIVE
E coli (STEC) stx1/stx2, PCR: NEGATIVE
E coli 0157, PCR: NEGATIVE
Giardia lamblia, PCR: NEGATIVE
Norovirus, PCR: NEGATIVE
Rotavirus A, PCR: NEGATIVE
Salmonella, PCR: NEGATIVE
Shigella, PCR: NEGATIVE

## 2015-10-30 NOTE — Telephone Encounter (Signed)
Patient is waiting for her lab results and needs a note to go back to work.    [phone]

## 2015-10-30 NOTE — Telephone Encounter (Signed)
Looks like they are not back yet.

## 2015-11-02 ENCOUNTER — Telehealth: Payer: Self-pay

## 2015-11-02 NOTE — Telephone Encounter (Signed)
Pt states that she has solid stools for several days and is wanting to know if she can have a note to go back to work from having c-diff  Best number [phone]

## 2015-11-10 ENCOUNTER — Ambulatory Visit (INDEPENDENT_AMBULATORY_CARE_PROVIDER_SITE_OTHER): Payer: 59 | Admitting: Family Medicine

## 2015-11-10 VITALS — BP 114/88 | HR 94 | Temp 97.6°F | Resp 16 | Ht 68.0 in | Wt 191.4 lb

## 2015-11-10 DIAGNOSIS — F419 Anxiety disorder, unspecified: Secondary | ICD-10-CM

## 2015-11-10 DIAGNOSIS — R112 Nausea with vomiting, unspecified: Secondary | ICD-10-CM

## 2015-11-10 DIAGNOSIS — A047 Enterocolitis due to Clostridium difficile: Secondary | ICD-10-CM | POA: Diagnosis not present

## 2015-11-10 DIAGNOSIS — A0472 Enterocolitis due to Clostridium difficile, not specified as recurrent: Secondary | ICD-10-CM

## 2015-11-10 DIAGNOSIS — A0471 Enterocolitis due to Clostridium difficile, recurrent: Secondary | ICD-10-CM

## 2015-11-10 LAB — POCT CBC
GRANULOCYTE PERCENT: 71.5 % (ref 37–80)
HEMATOCRIT: 41.2 % (ref 37.7–47.9)
HEMOGLOBIN: 14.7 g/dL (ref 12.2–16.2)
LYMPH, POC: 2.2 (ref 0.6–3.4)
MCH, POC: 29.9 pg (ref 27–31.2)
MCHC: 35.6 g/dL — AB (ref 31.8–35.4)
MCV: 83.8 fL (ref 80–97)
MID (cbc): 0.6 (ref 0–0.9)
MPV: 9.4 fL (ref 0–99.8)
PLATELET COUNT, POC: 249 10*3/uL (ref 142–424)
POC GRANULOCYTE: 7.1 — AB (ref 2–6.9)
POC LYMPH PERCENT: 22 %L (ref 10–50)
POC MID %: 6.5 %M (ref 0–12)
RBC: 4.91 M/uL (ref 4.04–5.48)
RDW, POC: 14 %
WBC: 9.9 10*3/uL (ref 4.6–10.2)

## 2015-11-10 LAB — COMPREHENSIVE METABOLIC PANEL
ALT: 20 U/L (ref 6–29)
AST: 14 U/L (ref 10–35)
Albumin: 3.5 g/dL — ABNORMAL LOW (ref 3.6–5.1)
Alkaline Phosphatase: 59 U/L (ref 33–130)
BILIRUBIN TOTAL: 0.6 mg/dL (ref 0.2–1.2)
BUN: 8 mg/dL (ref 7–25)
CHLORIDE: 95 mmol/L — AB (ref 98–110)
CO2: 28 mmol/L (ref 20–31)
Calcium: 8.8 mg/dL (ref 8.6–10.4)
Creat: 0.8 mg/dL (ref 0.50–1.05)
Glucose, Bld: 94 mg/dL (ref 65–99)
POTASSIUM: 3.3 mmol/L — AB (ref 3.5–5.3)
SODIUM: 136 mmol/L (ref 135–146)
TOTAL PROTEIN: 6.1 g/dL (ref 6.1–8.1)

## 2015-11-10 MED ORDER — ONDANSETRON 4 MG PO TBDP: 4.0000 mg | ORAL_TABLET | Freq: Three times a day (TID) | ORAL | Status: DC | PRN

## 2015-11-10 NOTE — Progress Notes (Addendum)
[facility] [address], [place] [address] [phone]  Date:  11/10/2015   Name:  Connie Wilson   DOB:  1956/08/13   MRN:  [id number]  PCP:  No PCP Per Patient    Chief Complaint: Follow-up   History of Present Illness:  Connie Wilson is a 60 y.o. very pleasant female patient who presents with the following:  Here today with concern of recurrent c diff- her stool was positive for C diff on 10/01/15.  She was treated and had a negative c diff on 10/29/2015.   Today is Tuesday. Over Friday and Saturday her diarrhea "came back", and she has noted chills and temp to 100.9.  Diarrhea is non- bloody. She is having "mucus and poop" stools, and will have a stool after she eats pretty consistently.   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  There has been c diff in some residents   She has noted nausea but no vomiting. she does have zofran to take as needed. Could use a refill  She has been taking po vanc again for the last 2 days.  She also took a week of flagyl starting on 1/19 per Dr. Elbert Ewings.    She has an upcoming appt with [facility] on 2/7.  She did have a negative colonosocpy at age 7- was supposed to repeat at age 53 but did not do so yet Her father did have colon cancer  She is having 4-5 loose, watery stools a day currently She has lost some weight over this illness.  She has been trying to lose weight in addition to her GI symptoms   Wt Readings from Last 3 Encounters:  11/10/15 191 lb 6.4 oz (86.818 kg)  10/29/15 195 lb (88.451 kg)  07/24/15 215 lb (97.523 kg)     Patient Active Problem List   Diagnosis Date Noted  . Elevated glucose 09/29/2012  . HTN (hypertension) 01/06/2012  . Hyperlipidemia 01/06/2012    Past Medical History  Diagnosis Date  . Hyperlipidemia   . Hypertension   . Anxiety   . Depression   . Allergy   . Arthritis     Past Surgical History  Procedure Laterality Date  . Fx. r ankle    . Tubal ligation     . Fracture surgery      Social History  Substance Use Topics  . Smoking status: Never Smoker   . Smokeless tobacco: Never Used  . Alcohol Use: No    Family History  Problem Relation Age of Onset  . Ovarian cancer Mother   . Cancer Mother     ovarian  . Heart failure Father   . Colon cancer Father   . Hypertension Father   . Cancer Father     prostate  . Hypertension Sister   . Hyperlipidemia Sister   . Diabetes Maternal Grandmother   . Heart attack Maternal Grandfather     Allergies  Allergen Reactions  . Penicillins Rash    Has patient had a PCN reaction causing immediate rash, facial/tongue/throat swelling, SOB or lightheadedness with hypotension: No (pt  Did have rash) Has patient had a PCN reaction causing severe rash involving mucus membranes or skin necrosis: No Has patient had a PCN reaction that required hospitalization: No Has patient had a PCN reaction occurring within the last 10 years:No If all of the above answers are "NO", then may proceed with Cephalosporin use.     Medication list  has been reviewed and updated.  Current Outpatient Prescriptions on File Prior to Visit  Medication Sig Dispense Refill  . Ascorbic Acid (VITAMIN C) 1000 MG tablet Take 1,000 mg by mouth daily.    . calcium citrate (CALCITRATE - DOSED IN MG ELEMENTAL CALCIUM) 950 MG tablet Take 1 tablet by mouth 2 (two) times daily.    . citalopram (CELEXA) 40 MG tablet Take 1 tablet daily for anxiety and depression (Patient taking differently: Take 40 mg by mouth daily. Take daily for anxiety and depression) 90 tablet 3  . clonazePAM (KLONOPIN) 0.5 MG tablet Take 1 tablet at bedtime for sleep.  May also take 1/2 or 1 tablet daily for anxiety as needed (Patient taking differently: Take 0.5 mg by mouth at bedtime. May also take 1/2 or 1 tablet daily for anxiety as needed) 60 tablet 2  . Doxylamine Succinate, Sleep, (SLEEP AID PO) Take 3 tablets by mouth at bedtime.    . fish oil-omega-3 fatty  acids 1000 MG capsule Take 1 g by mouth 2 (two) times daily.     Marland Kitchen ipratropium (ATROVENT) 0.03 % nasal spray Place 2 sprays into both nostrils 2 (two) times daily. 30 mL 0  . lisinopril-hydrochlorothiazide (PRINZIDE,ZESTORETIC) 20-25 MG tablet TAKE ONE TABLET BY MOUTH ONCE DAILY. 90 tablet 3  . metroNIDAZOLE (FLAGYL) 500 MG tablet Take 1 tablet (500 mg total) by mouth 2 (two) times daily with a meal. DO NOT CONSUME ALCOHOL WHILE TAKING THIS MEDICATION. 14 tablet 0  . Multiple Vitamin (MULTIVITAMIN) tablet Take 1 tablet by mouth daily.    . ondansetron (ZOFRAN ODT) 4 MG disintegrating tablet Take 1 tablet (4 mg total) by mouth every 8 (eight) hours as needed for nausea or vomiting. 20 tablet 0  . pravastatin (PRAVACHOL) 40 MG tablet TAKE ONE TABLET BY MOUTH ONCE DAILY. (Patient taking differently: Take 40 mg by mouth daily. ) 90 tablet 3   No current facility-administered medications on file prior to visit.    Review of Systems:  As per HPI- otherwise negative.   Physical Examination: Filed Vitals:   11/10/15 0817  BP: 114/88  Pulse: 94  Temp: 97.6 F (36.4 C)  Resp: 16   Filed Vitals:   11/10/15 0817  Height:  (1.727 m)  Weight: 191 lb 6.4 oz (86.818 kg)   Body mass index is 29.11 kg/(m^2). Ideal Body Weight: Weight in (lb) to have BMI = 25: 164.1  GEN: WDWN, NAD, Non-toxic, A & O x 3, overweight, looks well HEENT: Atraumatic, Normocephalic. Neck supple. No masses, No LAD. Ears and Nose: No external deformity. CV: RRR, No M/G/R. No JVD. No thrill. No extra heart sounds. PULM: CTA B, no wheezes, crackles, rhonchi . No retractions. No resp. distress. No accessory muscle use. ABD: S, ND, +increased BS. No rebound. No HSM.  Mild epigastric TTP EXTR: No c/c/e NEURO Normal gait.  PSYCH: Normally interactive. Conversant. Not depressed or anxious appearing.  Calm demeanor.   Results for orders placed or performed in visit on 11/10/15  POCT CBC  Result Value Ref Range   WBC  9.9 4.6 - 10.2 K/uL   Lymph, poc 2.2 0.6 - 3.4   POC LYMPH PERCENT 22.0 10 - 50 %L   MID (cbc) 0.6 0 - 0.9   POC MID % 6.5 0 - 12 %M   POC Granulocyte 7.1 (A) 2 - 6.9   Granulocyte percent 71.5 37 - 80 %G   RBC 4.91 4.04 - 5.48 M/uL   Hemoglobin  14.7 12.2 - 16.2 g/dL   HCT, POC 16.1 09.6 - 47.9 %   MCV 83.8 80 - 97 fL   MCH, POC 29.9 27 - 31.2 pg   MCHC 35.6 (A) 31.8 - 35.4 g/dL   RDW, POC 04.5 %   Platelet Count, POC 249 142 - 424 K/uL   MPV 9.4 0 - 99.8 fL    Assessment and Plan: Enteritis due to Clostridium difficile - Plan: Clostridium Difficile by PCR, CANCELED: Clostridium difficile EIA  Nausea and vomiting, intractability of vomiting not specified, unspecified vomiting type - Plan: ondansetron (ZOFRAN ODT) 4 MG disintegrating tablet, POCT CBC, Comprehensive metabolic panel, Clostridium Difficile by PCR  Defer any additional treatment while we await her repeat c diff test today Refilled her zofran Noted hypokalemia at last labs so will recheck today Encouraged careful hand hygiene   Signed Abbe Amsterdam, MD  Received her CMP and gave her a call 2/1.  She is not on any diuretics.  Encouraged her to liberalize her salt intake somewhat and she will do so  Received positive C diff in the afternoon-  Called pt again but did not reach her.  Will call back.  Advised her not to be at work as she does have c diff again.    Called on 2/2 and spoke with ID consultant on the phone.  Will add on a C diff toxin if possible and treat for recurrence with the vancomycin "pulse and taper" regimen that is outlined on uptodate.  Called pt and let her know. She will report this to her employer and follow their directives about returning to work.  Will also add on a C diff toxin and refill her klonopin as all of this is very stressful.  She denies any risk of self harm  Results for orders placed or performed in visit on 11/10/15  Comprehensive metabolic panel  Result Value Ref Range    Sodium 136 135 - 146 mmol/L   Potassium 3.3 (L) 3.5 - 5.3 mmol/L   Chloride 95 (L) 98 - 110 mmol/L   CO2 28 20 - 31 mmol/L   Glucose, Bld 94 65 - 99 mg/dL   BUN 8 7 - 25 mg/dL   Creat 4.09 8.11 - 9.14 mg/dL   Total Bilirubin 0.6 0.2 - 1.2 mg/dL   Alkaline Phosphatase 59 33 - 130 U/L   AST 14 10 - 35 U/L   ALT 20 6 - 29 U/L   Total Protein 6.1 6.1 - 8.1 g/dL   Albumin 3.5 (L) 3.6 - 5.1 g/dL   Calcium 8.8 8.6 - 78.2 mg/dL  POCT CBC  Result Value Ref Range   WBC 9.9 4.6 - 10.2 K/uL   Lymph, poc 2.2 0.6 - 3.4   POC LYMPH PERCENT 22.0 10 - 50 %L   MID (cbc) 0.6 0 - 0.9   POC MID % 6.5 0 - 12 %M   POC Granulocyte 7.1 (A) 2 - 6.9   Granulocyte percent 71.5 37 - 80 %G   RBC 4.91 4.04 - 5.48 M/uL   Hemoglobin 14.7 12.2 - 16.2 g/dL   HCT, POC 95.6 21.3 - 47.9 %   MCV 83.8 80 - 97 fL   MCH, POC 29.9 27 - 31.2 pg   MCHC 35.6 (A) 31.8 - 35.4 g/dL   RDW, POC 08.6 %   Platelet Count, POC 249 142 - 424 K/uL   MPV 9.4 0 - 99.8 fL   Meds ordered this encounter  Medications  . ondansetron (ZOFRAN ODT) 4 MG disintegrating tablet    Sig: Take 1 tablet (4 mg total) by mouth every 8 (eight) hours as needed for nausea or vomiting.    Dispense:  20 tablet    Refill:  0  . vancomycin (VANCOCIN) 125 MG capsule    Sig: Take 1 4x daily for 2 weeks Then take 1 twice daily for 1 week Then take 1 once daily for 1 week Then take 1 every other day for 1 week Then take 1 every third day for 2 weeks    Dispense:  90 capsule    Refill:  0  . clonazePAM (KLONOPIN) 0.5 MG tablet    Sig: Take 1 tablet (0.5 mg total) by mouth at bedtime. May also take 1/2 or 1 tablet daily for anxiety as needed    Dispense:  60 tablet    Refill:  2

## 2015-11-10 NOTE — Patient Instructions (Signed)
It was good to see you today- I will be in touch with your labs asap As far as we know your C diff is negative but we will test you again If you are getting worse please let me know!

## 2015-11-11 LAB — CLOSTRIDIUM DIFFICILE BY PCR: CDIFFPCR: DETECTED — AB

## 2015-11-12 DIAGNOSIS — A0471 Enterocolitis due to Clostridium difficile, recurrent: Secondary | ICD-10-CM | POA: Insufficient documentation

## 2015-11-12 MED ORDER — VANCOMYCIN HCL 125 MG PO CAPS: ORAL_CAPSULE | ORAL | Status: DC

## 2015-11-12 MED ORDER — CLONAZEPAM 0.5 MG PO TABS: 0.5000 mg | ORAL_TABLET | Freq: Every day | ORAL | Status: DC

## 2015-11-12 NOTE — Addendum Note (Signed)
Addended by: Abbe Amsterdam C on: 11/12/2015 01:29 PM   Modules accepted: Orders

## 2015-11-15 ENCOUNTER — Encounter: Payer: Self-pay | Admitting: Family Medicine

## 2015-11-17 ENCOUNTER — Telehealth: Payer: Self-pay | Admitting: *Deleted

## 2015-11-17 ENCOUNTER — Ambulatory Visit (INDEPENDENT_AMBULATORY_CARE_PROVIDER_SITE_OTHER): Payer: 59 | Admitting: Physician Assistant

## 2015-11-17 ENCOUNTER — Encounter: Payer: Self-pay | Admitting: Physician Assistant

## 2015-11-17 VITALS — BP 124/84 | HR 78 | Ht 68.0 in | Wt 194.6 lb

## 2015-11-17 DIAGNOSIS — Z800 Family history of malignant neoplasm of digestive organs: Secondary | ICD-10-CM | POA: Diagnosis not present

## 2015-11-17 DIAGNOSIS — A0472 Enterocolitis due to Clostridium difficile, not specified as recurrent: Secondary | ICD-10-CM

## 2015-11-17 DIAGNOSIS — A047 Enterocolitis due to Clostridium difficile: Secondary | ICD-10-CM

## 2015-11-17 MED ORDER — VANCOMYCIN HCL 125 MG PO CAPS: ORAL_CAPSULE | ORAL | Status: DC

## 2015-11-17 NOTE — Telephone Encounter (Signed)
LM for the patient to advise the prescription we sent for the vancomycin , # 30 .  The taper we gave her instructions on actually total # 42 tablets.  I LM for her to let Nicolette Bang know if she does not need the extra 12 tablets.  She is finishing up a prescription from the hospital.

## 2015-11-17 NOTE — Patient Instructions (Addendum)
You have been scheduled for a colonoscopy. Please follow written instructions given to you at your visit today.  Please pick up your prep supplies at the pharmacy within the next 1-3 days. [facility] [address]  If you use inhalers (even only as needed), please bring them with you on the day of your procedure. Your physician has requested that you go to www.startemmi.com and enter the access code given to you at your visit today. This web site gives a general overview about your procedure. However, you should still follow specific instructions given to you by our office regarding your preparation for the procedure.  We sent a prescription for Florastor probiotic and Vancomycin 125 mg. Vancomycin 125 mg:  Taper: Take 1 tab 3 times daily x 7 days             Take 1 tab 2 times daily x 7 days             Take 1 tablet daily x 7 days              Take 1 tablet every other day x 7 days.

## 2015-11-17 NOTE — Progress Notes (Signed)
Patient ID: Connie Wilson, female   DOB: July 05, 1956, 60 y.o.   MRN: [id number]   Subjective:    Patient ID: Connie Wilson, female    DOB: December 14, 1955, 60 y.o.   MRN: [id number]  HPI  Connie Wilson is a pleasant 60 year old white female known previously to Dr. Russella Dar from prior colonoscopy which was done at age 48.. This was a negative exam. She does have family history of colon cancer in her father. Patient is referred today by Dr. Warner Mccreedy for evaluation of relapsed C. difficile colitis. Patient initially was diagnosed on 10/01/2015. She was treated with a course of metronidazole and says that her diarrhea for the most part had resolved and then recurred towards the end of January. Patient says when she initially became ill with C. difficile she was having watery stools about every 1 hour and associated fever and chills. Thinking back she did take a short course of antibiotics in December which she thinks may have been clindamycin after a tooth at extraction. 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 When her diarrhea recurred she was started on vancomycin 125 mg by mouth 4 times daily. She is now into her second week of vancomycin. She is still having "diarrhea but on careful questioning she is only having 3-4 soft bowel movements per day nonbloody. She denies any significant abdominal pain or cramping no fever or chills. Appetite is much better and she is able to eat.   Review of Systems  Pertinent positive and negative review of systems were noted in the above HPI section.  All other review of systems was otherwise negative.  Outpatient Encounter Prescriptions as of 11/17/2015  Medication Sig  . Ascorbic Acid (VITAMIN C) 1000 MG tablet Take 1,000 mg by mouth daily.  . calcium citrate (CALCITRATE - DOSED IN MG ELEMENTAL CALCIUM) 950 MG tablet Take 1 tablet by mouth 2 (two) times daily.  . citalopram (CELEXA) 40 MG tablet Take 1 tablet  daily for anxiety and depression (Patient taking differently: Take 40 mg by mouth daily. Take daily for anxiety and depression)  . clonazePAM (KLONOPIN) 0.5 MG tablet Take 1 tablet (0.5 mg total) by mouth at bedtime. May also take 1/2 or 1 tablet daily for anxiety as needed  . Doxylamine Succinate, Sleep, (SLEEP AID PO) Take 3 tablets by mouth at bedtime.  . fish oil-omega-3 fatty acids 1000 MG capsule Take 1 g by mouth 2 (two) times daily.   Marland Kitchen ipratropium (ATROVENT) 0.03 % nasal spray Place 2 sprays into both nostrils 2 (two) times daily.  Marland Kitchen lisinopril-hydrochlorothiazide (PRINZIDE,ZESTORETIC) 20-25 MG tablet TAKE ONE TABLET BY MOUTH ONCE DAILY.  . Multiple Vitamin (MULTIVITAMIN) tablet Take 1 tablet by mouth daily.  . ondansetron (ZOFRAN ODT) 4 MG disintegrating tablet Take 1 tablet (4 mg total) by mouth every 8 (eight) hours as needed for nausea or vomiting.  . pravastatin (PRAVACHOL) 40 MG tablet TAKE ONE TABLET BY MOUTH ONCE DAILY. (Patient taking differently: Take 40 mg by mouth daily. )  . vancomycin (VANCOCIN) 125 MG capsule Take 1 tablet 3 times daily x 7 days. Take 1 tablet 2 times daily x 7 days Take 1 tablet daily x 7 days Take 1 tablet every other day x 7 days.  . [DISCONTINUED] vancomycin (VANCOCIN) 125 MG capsule Take 1 4x daily for 2 weeks Then take 1 twice daily for 1 week Then take 1 once daily for 1 week Then take 1  every other day for 1 week Then take 1 every third day for 2 weeks  . [DISCONTINUED] metroNIDAZOLE (FLAGYL) 500 MG tablet Take 1 tablet (500 mg total) by mouth 2 (two) times daily with a meal. DO NOT CONSUME ALCOHOL WHILE TAKING THIS MEDICATION. (Patient not taking: Reported on 11/17/2015)   No facility-administered encounter medications on file as of 11/17/2015.   Allergies  Allergen Reactions  . Penicillins Rash    Has patient had a PCN reaction causing immediate rash, facial/tongue/throat swelling, SOB or lightheadedness with hypotension: No (pt  Did have  rash) Has patient had a PCN reaction causing severe rash involving mucus membranes or skin necrosis: No Has patient had a PCN reaction that required hospitalization: No Has patient had a PCN reaction occurring within the last 10 years:No If all of the above answers are "NO", then may proceed with Cephalosporin use.    Patient Active Problem List   Diagnosis Date Noted  . Recurrent Clostridium difficile diarrhea 11/12/2015  . Elevated glucose 09/29/2012  . HTN (hypertension) 01/06/2012  . Hyperlipidemia 01/06/2012   Social History   Social History  . Marital Status: Divorced    Spouse Name: N/A  . Number of Children: N/A  . Years of Education: N/A   Occupational History  . Not on file.   Social History Main Topics  . Smoking status: Never Smoker   . Smokeless tobacco: Never Used  . Alcohol Use: No  . Drug Use: No  . Sexual Activity: Not Currently    Birth Control/ Protection: Post-menopausal   Other Topics Concern  . Not on file   Social History Narrative    Ms. Slaby family history includes Cancer in her father and mother; Colon cancer in her father; Diabetes in her maternal grandmother; Heart attack in her maternal grandfather; Heart failure in her father; Hyperlipidemia in her sister; Hypertension in her father and sister; Ovarian cancer in her mother.      Objective:    Filed Vitals:   11/17/15 1029  BP: 124/84  Pulse: 78    Physical Exam  developed older white female in no acute distress, pleasant blood pressure 124/84 pulse 78 height 5 foot 8 weight 194. HEENT ;nontraumatic, cephalic EOMI PERRLA sclera anicteric, Cardiovascular; regular rate and rhythm with S1-S2 no murmur or gallop, Pulmonary ;clear bilaterally, Abdomen ;soft nontender nondistended bowel sounds are active there is no palpable mass or hepatosplenomegaly, Rectal; exam not done, Extremities ;no clubbing cyanosis or edema skin warm and dry, Neuropsych; mood and affect  appropriate     Assessment & Plan:   #1 60 yo female with relapsed C diff colitis- now on vancomycin and significantly improved . Risk factors were antibiotic use and exposure to pt with cdiff at [facility] #2  Family hx of Colon cancer in pts Father #3 HTN  Plan; Discussed natural history of cdiff, and management  Will start floor store one by mouth twice a day 2 months Will also do a bit longer tapered course then was initially ordered. She will complete 2 weeks although 125 mg 4 times a day then take 125 mg by mouth 3 times a day 7 days then 125 mg by mouth twice a day 7 days then 125 mg daily 7 days then 125 mg every other day 7 days.  She is overdue for follow-up colonoscopy and will schedule for colonoscopy with Dr. Russella Dar, but purposely not scheduled until towards the end of April to allow adequate time to treat C. difficile. Procedure  was discussed in detail with patient and she is agreeable to proceed.    Nimai Burbach S Mailyn Steichen PA-C 11/17/2015   Cc: Elvina Sidle, MD

## 2015-11-17 NOTE — Progress Notes (Signed)
Reviewed and agree with management plan.  Malcolm T. Stark, MD FACG 

## 2015-11-26 ENCOUNTER — Other Ambulatory Visit: Payer: Self-pay

## 2015-11-26 DIAGNOSIS — F419 Anxiety disorder, unspecified: Secondary | ICD-10-CM

## 2015-11-26 MED ORDER — CITALOPRAM HYDROBROMIDE 40 MG PO TABS: ORAL_TABLET | ORAL | Status: DC

## 2015-11-27 ENCOUNTER — Telehealth: Payer: Self-pay | Admitting: Physician Assistant

## 2015-11-27 NOTE — Telephone Encounter (Signed)
Connie Wilson, see request for work note from patient.  She is still on vanc.  Can we write this note?

## 2015-11-28 ENCOUNTER — Ambulatory Visit (INDEPENDENT_AMBULATORY_CARE_PROVIDER_SITE_OTHER): Payer: 59 | Admitting: Physician Assistant

## 2015-11-28 DIAGNOSIS — Z760 Encounter for issue of repeat prescription: Secondary | ICD-10-CM

## 2015-11-28 LAB — HEPATIC FUNCTION PANEL
ALT: 22 U/L (ref 6–29)
AST: 24 U/L (ref 10–35)
Albumin: 3.9 g/dL (ref 3.6–5.1)
Alkaline Phosphatase: 56 U/L (ref 33–130)
BILIRUBIN DIRECT: 0.2 mg/dL (ref <=0.2)
BILIRUBIN INDIRECT: 0.6 mg/dL (ref 0.2–1.2)
BILIRUBIN TOTAL: 0.8 mg/dL (ref 0.2–1.2)
Total Protein: 6.6 g/dL (ref 6.1–8.1)

## 2015-11-28 MED ORDER — HYDROCORTISONE 2.5 % RE CREA: 1.0000 "application " | TOPICAL_CREAM | Freq: Two times a day (BID) | RECTAL | Status: DC

## 2015-11-28 MED ORDER — IPRATROPIUM BROMIDE 0.03 % NA SOLN: 2.0000 | Freq: Two times a day (BID) | NASAL | Status: DC

## 2015-11-28 MED ORDER — TRAZODONE HCL 50 MG PO TABS: 50.0000 mg | ORAL_TABLET | Freq: Every day | ORAL | Status: DC

## 2015-11-28 MED ORDER — CITALOPRAM HYDROBROMIDE 40 MG PO TABS: ORAL_TABLET | ORAL | Status: DC

## 2015-11-28 NOTE — Progress Notes (Signed)
11/28/2015 9:10 AM   DOB: 27-Feb-1956 / MRN: [id number]  SUBJECTIVE:  Connie Wilson is a 60 y.o. female presenting for medication refills.  She would like refills of her, anusol cream, Klonopin, citalopram ,and Atrovent.  She reports her depression is well managed at this time.  She is using Klonopin largely to sleep.   She is allergic to penicillins.   She  has a past medical history of Hyperlipidemia; Hypertension; Anxiety; Depression; Allergy; and Arthritis.    She  reports that she has never smoked. She has never used smokeless tobacco. She reports that she does not drink alcohol or use illicit drugs. She  reports that she does not currently engage in sexual activity. She reports using the following method of birth control/protection: Post-menopausal. The patient  has past surgical history that includes fx. R ankle; Tubal ligation; and Fracture surgery.  Her family history includes Cancer in her father and mother; Colon cancer in her father; Diabetes in her maternal grandmother; Heart attack in her maternal grandfather; Heart failure in her father; Hyperlipidemia in her sister; Hypertension in her father and sister; Ovarian cancer in her mother.  Review of Systems  Constitutional: Negative for fever and chills.  Eyes: Negative for blurred vision.  Respiratory: Negative for cough and shortness of breath.   Cardiovascular: Negative for chest pain.  Gastrointestinal: Negative for nausea and abdominal pain.  Genitourinary: Negative for dysuria, urgency and frequency.  Musculoskeletal: Negative for myalgias.  Skin: Negative for rash.  Neurological: Negative for dizziness, tingling and headaches.  Psychiatric/Behavioral: Negative for depression. The patient is not nervous/anxious.     Problem list and medications reviewed and updated by myself where necessary, and exist elsewhere in the encounter.   OBJECTIVE:  BP 152/84 mmHg  Pulse 59  Temp(Src) 97.5 F (36.4 C) (Oral)  Resp 16   Ht  (1.727 m)  Wt 194 lb 12.8 oz (88.361 kg)  BMI 29.63 kg/m2  SpO2 98%  Physical Exam  Constitutional: She is oriented to person, place, and time. She appears well-developed.  Eyes: EOM are normal. Pupils are equal, round, and reactive to light.  Cardiovascular: Normal rate.   Pulmonary/Chest: Effort normal.  Abdominal: She exhibits no distension.  Musculoskeletal: Normal range of motion.  Neurological: She is alert and oriented to person, place, and time. No cranial nerve deficit.  Skin: Skin is warm and dry. She is not diaphoretic.  Psychiatric: She has a normal mood and affect.  Vitals reviewed.   No results found for this or any previous visit (from the past 72 hour(s)).  No results found.  ASSESSMENT AND PLAN  Connie Wilson was seen today for medication refill.  Diagnoses and all orders for this visit:  Encounter for medication refill: I am stopping her Klonopin and starting Trazadone given that she is using Klonopin to sleep.  She has elevated BP today. We have rechecked this today and it has remained mildly elevated. Advised that she starte a BP log and if pressure is consistenly greater than 140/90 RTC in two weeks.  -     Hepatic Function Panel -     citalopram (CELEXA) 40 MG tablet; Take 1 tablet daily for anxiety and depression -     ipratropium (ATROVENT) 0.03 % nasal spray; Place 2 sprays into both nostrils 2 (two) times daily. -     traZODone (DESYREL) 50 MG tablet; Take 1-2 tablets (50-100 mg total) by mouth at bedtime. -     hydrocortisone (ANUSOL-HC) 2.5 % rectal  cream; Place 1 application rectally 2 (two) times daily.    The patient was advised to call or return to clinic if she does not see an improvement in symptoms or to seek the care of the closest emergency department if she worsens with the above plan.   Deliah Boston, MHS, PA-C [facility] 11/28/2015 9:10 AM

## 2015-12-01 ENCOUNTER — Telehealth: Payer: Self-pay

## 2015-12-01 NOTE — Telephone Encounter (Signed)
Pt states she need to up her dosage on her medication. Please call [phone] and pt have to leave for work around 2:00

## 2015-12-02 NOTE — Telephone Encounter (Signed)
Female answering phone states the patient is not available. She is back at work on the 2nd shift. He will tell her of the call.

## 2015-12-02 NOTE — Telephone Encounter (Signed)
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 Still on long course of vanco- if diarrhea is gone can go back to work next week,

## 2015-12-02 NOTE — Telephone Encounter (Signed)
Patient is taking 4 trazodone per day and it is not putting her to sleep.  She is not going to sleep until 430 am after laying down at 430 when she gets sleepy.  The first night she took one and that put her to sleep, the second night took two and that didn't work so she took 3 the next night and then 4 and now that is not working.   Please advise.

## 2015-12-03 NOTE — Telephone Encounter (Signed)
Patient contacted. Doing well. Letter will be picked up today. She will call if diarrhea returns.

## 2015-12-03 NOTE — Telephone Encounter (Signed)
Connie Wilson.  Please call her and advise she continue the Trazadone, as it may take some time to reach a full effect.  Advise that she try adding 25-50 mg of Benadryl to the trazadone prn. Deliah Boston, MS, PA-C 11:31 AM, 12/03/2015

## 2015-12-04 ENCOUNTER — Other Ambulatory Visit: Payer: Self-pay | Admitting: Family Medicine

## 2015-12-05 ENCOUNTER — Encounter: Payer: Self-pay | Admitting: *Deleted

## 2015-12-06 ENCOUNTER — Ambulatory Visit (INDEPENDENT_AMBULATORY_CARE_PROVIDER_SITE_OTHER): Payer: 59 | Admitting: Urgent Care

## 2015-12-06 VITALS — BP 152/104 | HR 71 | Resp 16 | Ht 68.0 in | Wt 195.0 lb

## 2015-12-06 DIAGNOSIS — R252 Cramp and spasm: Secondary | ICD-10-CM

## 2015-12-06 DIAGNOSIS — R1084 Generalized abdominal pain: Secondary | ICD-10-CM

## 2015-12-06 DIAGNOSIS — R8290 Unspecified abnormal findings in urine: Secondary | ICD-10-CM | POA: Diagnosis not present

## 2015-12-06 DIAGNOSIS — A0471 Enterocolitis due to Clostridium difficile, recurrent: Secondary | ICD-10-CM

## 2015-12-06 DIAGNOSIS — A047 Enterocolitis due to Clostridium difficile: Secondary | ICD-10-CM

## 2015-12-06 DIAGNOSIS — Z87448 Personal history of other diseases of urinary system: Secondary | ICD-10-CM

## 2015-12-06 DIAGNOSIS — Z87898 Personal history of other specified conditions: Secondary | ICD-10-CM

## 2015-12-06 LAB — POCT URINALYSIS DIP (MANUAL ENTRY)
Bilirubin, UA: NEGATIVE
GLUCOSE UA: NEGATIVE
Ketones, POC UA: NEGATIVE
NITRITE UA: NEGATIVE
Protein Ur, POC: NEGATIVE
Spec Grav, UA: 1.025
Urobilinogen, UA: 0.2
pH, UA: 5.5

## 2015-12-06 LAB — BASIC METABOLIC PANEL
BUN: 11 mg/dL (ref 7–25)
CHLORIDE: 105 mmol/L (ref 98–110)
CO2: 25 mmol/L (ref 20–31)
CREATININE: 0.73 mg/dL (ref 0.50–1.05)
Calcium: 8.4 mg/dL — ABNORMAL LOW (ref 8.6–10.4)
GLUCOSE: 84 mg/dL (ref 65–99)
Potassium: 3.8 mmol/L (ref 3.5–5.3)
Sodium: 137 mmol/L (ref 135–146)

## 2015-12-06 LAB — POC MICROSCOPIC URINALYSIS (UMFC)

## 2015-12-06 NOTE — Progress Notes (Signed)
MRN: [id number] DOB: 27-Feb-1956  Subjective:   Connie Wilson is a 60 y.o. female presenting for chief complaint of Other  Reports that she is concerned about dehydration. Patient works third and second shifts. She tries to hydrate well but feels like her urine color changes frequently from clear to dark. She also feel intermittent belly cramps. Denies fever, n/v, abdominal pain, dysuria, hematuria, cloudy urine, dizziness, heart racing. Patient has a history of HTN and C. Diff. She admits non-compliance with her BP medication but is compliant with her Vancomycin.  Teya has a current medication list which includes the following prescription(s): vitamin c, calcium citrate, citalopram, doxylamine succinate (sleep), fish oil-omega-3 fatty acids, ipratropium, lisinopril-hydrochlorothiazide, multivitamin, ondansetron, pravastatin, trazodone, vancomycin, and hydrocortisone. Also is allergic to clindamycin/lincomycin and penicillins.  Mariamawit  has a past medical history of Hyperlipidemia; Hypertension; Anxiety; Depression; Allergy; and Arthritis. Also  has past surgical history that includes fx. R ankle; Tubal ligation; and Fracture surgery.  Objective:   Vitals: BP 152/104 mmHg  Pulse 71  Resp 16  Ht  (1.727 m)  Wt 195 lb (88.451 kg)  BMI 29.66 kg/m2  SpO2 99%  Orthostatic VS for the past 24 hrs:  BP- Lying Pulse- Lying BP- Sitting Pulse- Sitting BP- Standing at 0 minutes Pulse- Standing at 0 minutes  12/06/15 0855 (!) 147/93 mmHg 60 (!) 148/93 mmHg 64 149/80 mmHg 65   Physical Exam  Constitutional: She is oriented to person, place, and time. She appears well-developed and well-nourished.  HENT:  Mouth/Throat: Oropharynx is clear and moist.  Eyes: No scleral icterus.  Cardiovascular: Normal rate, regular rhythm and intact distal pulses.  Exam reveals no gallop and no friction rub.   No murmur heard. Pulmonary/Chest: No respiratory distress. She has no wheezes. She has no rales.    Abdominal: Soft. Bowel sounds are normal. She exhibits no distension and no mass. There is no tenderness.  Musculoskeletal: She exhibits no edema.  Neurological: She is alert and oriented to person, place, and time.  Skin: Skin is warm and dry.    Results for orders placed or performed in visit on 12/06/15 (from the past 24 hour(s))  POCT urinalysis dipstick     Status: Abnormal   Collection Time: 12/06/15  8:48 AM  Result Value Ref Range   Color, UA yellow yellow   Clarity, UA hazy (A) clear   Glucose, UA negative negative   Bilirubin, UA negative negative   Ketones, POC UA negative negative   Spec Grav, UA 1.025    Blood, UA trace-lysed (A) negative   pH, UA 5.5    Protein Ur, POC negative negative   Urobilinogen, UA 0.2    Nitrite, UA Negative Negative   Leukocytes, UA small (1+) (A) Negative  POCT Microscopic Urinalysis ([facility])     Status: Abnormal   Collection Time: 12/06/15  8:48 AM  Result Value Ref Range   WBC,UR,HPF,POC Moderate (A) None WBC/hpf   RBC,UR,HPF,POC Few (A) None RBC/hpf   Bacteria Few (A) None, Too numerous to count   Mucus Present (A) Absent   Epithelial Cells, UR Per Microscopy Moderate (A) None, Too numerous to count cells/hpf   Assessment and Plan :   1. Abnormal urinalysis 2. History of urine color changes - Urine culture pending, counseled patient on adequate hydration. I also recommended she be compliant with her blood pressure medication. Patient verbalized understanding and is agreeable to antibiotic therapy depending on her urine culture results.  3. Generalized abdominal cramps  4. Recurrent Clostridium difficile diarrhea - Continue f/u with GI.  Wallis Bamberg, PA-C [facility] [phone] 12/06/2015 8:32 AM

## 2015-12-06 NOTE — Patient Instructions (Signed)
-   Please make sure that you are hydrating very well. Alternate between full bottle of water then 1/2 water, 1/2 Gatorade. Drink about 4 of these daily.

## 2015-12-07 LAB — URINE CULTURE: Colony Count: 3000

## 2016-01-11 ENCOUNTER — Encounter (HOSPITAL_COMMUNITY): Payer: Self-pay | Admitting: *Deleted

## 2016-01-11 ENCOUNTER — Emergency Department (HOSPITAL_COMMUNITY)
Admission: EM | Admit: 2016-01-11 | Discharge: 2016-01-12 | Disposition: A | Discharge status: Home / Self Care | Payer: 59 | Attending: Emergency Medicine | Admitting: Emergency Medicine

## 2016-01-11 DIAGNOSIS — Z79899 Other long term (current) drug therapy: Secondary | ICD-10-CM | POA: Diagnosis not present

## 2016-01-11 DIAGNOSIS — S29011A Strain of muscle and tendon of front wall of thorax, initial encounter: Secondary | ICD-10-CM | POA: Diagnosis not present

## 2016-01-11 DIAGNOSIS — I10 Essential (primary) hypertension: Secondary | ICD-10-CM | POA: Insufficient documentation

## 2016-01-11 DIAGNOSIS — F419 Anxiety disorder, unspecified: Secondary | ICD-10-CM | POA: Diagnosis not present

## 2016-01-11 DIAGNOSIS — E785 Hyperlipidemia, unspecified: Secondary | ICD-10-CM | POA: Insufficient documentation

## 2016-01-11 DIAGNOSIS — Z880 Allergy status to penicillin: Secondary | ICD-10-CM | POA: Insufficient documentation

## 2016-01-11 DIAGNOSIS — Y92410 Unspecified street and highway as the place of occurrence of the external cause: Secondary | ICD-10-CM | POA: Insufficient documentation

## 2016-01-11 DIAGNOSIS — F329 Major depressive disorder, single episode, unspecified: Secondary | ICD-10-CM | POA: Insufficient documentation

## 2016-01-11 DIAGNOSIS — Y9389 Activity, other specified: Secondary | ICD-10-CM | POA: Diagnosis not present

## 2016-01-11 DIAGNOSIS — S161XXA Strain of muscle, fascia and tendon at neck level, initial encounter: Secondary | ICD-10-CM

## 2016-01-11 DIAGNOSIS — M1990 Unspecified osteoarthritis, unspecified site: Secondary | ICD-10-CM | POA: Diagnosis not present

## 2016-01-11 DIAGNOSIS — Y998 Other external cause status: Secondary | ICD-10-CM | POA: Diagnosis not present

## 2016-01-11 DIAGNOSIS — S199XXA Unspecified injury of neck, initial encounter: Secondary | ICD-10-CM | POA: Diagnosis present
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
## 2016-01-12 ENCOUNTER — Emergency Department (HOSPITAL_COMMUNITY): Payer: 59

## 2016-01-12 DIAGNOSIS — S161XXA Strain of muscle, fascia and tendon at neck level, initial encounter: Secondary | ICD-10-CM | POA: Diagnosis not present

## 2016-01-12 MED ORDER — NAPROXEN 500 MG PO TABS: 500.0000 mg | ORAL_TABLET | Freq: Two times a day (BID) | ORAL | Status: DC

## 2016-01-12 MED ORDER — OXYCODONE-ACETAMINOPHEN 5-325 MG PO TABS
2.0000 | ORAL_TABLET | Freq: Once | ORAL | Status: AC
  Administered 2016-01-12: 2 via ORAL
  Filled 2016-01-12: qty 2

## 2016-01-12 MED ORDER — METHOCARBAMOL 500 MG PO TABS: 500.0000 mg | ORAL_TABLET | Freq: Two times a day (BID) | ORAL | Status: DC

## 2016-01-12 MED ORDER — OXYCODONE-ACETAMINOPHEN 5-325 MG PO TABS: 1.0000 | ORAL_TABLET | Freq: Four times a day (QID) | ORAL | Status: DC | PRN

## 2016-01-12 NOTE — ED Notes (Signed)
Pt in radiology 
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
## 2016-01-19 ENCOUNTER — Encounter: Payer: 59 | Admitting: Gastroenterology

## 2016-04-01 ENCOUNTER — Ambulatory Visit (INDEPENDENT_AMBULATORY_CARE_PROVIDER_SITE_OTHER): Payer: 59 | Admitting: Family Medicine

## 2016-04-01 VITALS — BP 132/88 | HR 92 | Temp 97.9°F | Resp 16 | Ht 68.0 in | Wt 202.0 lb

## 2016-04-01 DIAGNOSIS — G4700 Insomnia, unspecified: Secondary | ICD-10-CM | POA: Diagnosis not present

## 2016-04-01 DIAGNOSIS — R55 Syncope and collapse: Secondary | ICD-10-CM | POA: Diagnosis not present

## 2016-04-01 DIAGNOSIS — F329 Major depressive disorder, single episode, unspecified: Secondary | ICD-10-CM

## 2016-04-01 DIAGNOSIS — R05 Cough: Secondary | ICD-10-CM | POA: Diagnosis not present

## 2016-04-01 DIAGNOSIS — Z760 Encounter for issue of repeat prescription: Secondary | ICD-10-CM

## 2016-04-01 DIAGNOSIS — J069 Acute upper respiratory infection, unspecified: Secondary | ICD-10-CM

## 2016-04-01 DIAGNOSIS — R059 Cough, unspecified: Secondary | ICD-10-CM

## 2016-04-01 DIAGNOSIS — H109 Unspecified conjunctivitis: Secondary | ICD-10-CM

## 2016-04-01 DIAGNOSIS — J019 Acute sinusitis, unspecified: Secondary | ICD-10-CM

## 2016-04-01 DIAGNOSIS — E785 Hyperlipidemia, unspecified: Secondary | ICD-10-CM

## 2016-04-01 DIAGNOSIS — F32A Depression, unspecified: Secondary | ICD-10-CM

## 2016-04-01 DIAGNOSIS — I10 Essential (primary) hypertension: Secondary | ICD-10-CM

## 2016-04-01 LAB — BASIC METABOLIC PANEL
BUN: 14 mg/dL (ref 7–25)
CHLORIDE: 102 mmol/L (ref 98–110)
CO2: 27 mmol/L (ref 20–31)
CREATININE: 0.81 mg/dL (ref 0.50–0.99)
Calcium: 9.2 mg/dL (ref 8.6–10.4)
Glucose, Bld: 98 mg/dL (ref 65–99)
Potassium: 4.2 mmol/L (ref 3.5–5.3)
Sodium: 138 mmol/L (ref 135–146)

## 2016-04-01 LAB — POCT CBC
GRANULOCYTE PERCENT: 68.6 % (ref 37–80)
HCT, POC: 39.7 % (ref 37.7–47.9)
HEMOGLOBIN: 13.6 g/dL (ref 12.2–16.2)
Lymph, poc: 2.7 (ref 0.6–3.4)
MCH, POC: 29.2 pg (ref 27–31.2)
MCHC: 34.2 g/dL (ref 31.8–35.4)
MCV: 85.4 fL (ref 80–97)
MID (CBC): 0.5 (ref 0–0.9)
MPV: 9.2 fL (ref 0–99.8)
PLATELET COUNT, POC: 238 10*3/uL (ref 142–424)
POC Granulocyte: 7 — AB (ref 2–6.9)
POC LYMPH PERCENT: 26.2 %L (ref 10–50)
POC MID %: 5.2 %M (ref 0–12)
RBC: 4.64 M/uL (ref 4.04–5.48)
RDW, POC: 12.9 %
WBC: 10.2 10*3/uL (ref 4.6–10.2)

## 2016-04-01 LAB — GLUCOSE, POCT (MANUAL RESULT ENTRY): POC Glucose: 94 mg/dl (ref 70–99)

## 2016-04-01 MED ORDER — CITALOPRAM HYDROBROMIDE 40 MG PO TABS: ORAL_TABLET | ORAL | Status: DC

## 2016-04-01 MED ORDER — PRAVASTATIN SODIUM 40 MG PO TABS
ORAL_TABLET | ORAL | Status: DC
Start: 2016-04-01 — End: 2016-10-19

## 2016-04-01 MED ORDER — AZITHROMYCIN 250 MG PO TABS: ORAL_TABLET | ORAL | Status: DC

## 2016-04-01 MED ORDER — POLYMYXIN B-TRIMETHOPRIM 10000-0.1 UNIT/ML-% OP SOLN: 1.0000 [drp] | OPHTHALMIC | Status: DC

## 2016-04-01 MED ORDER — LISINOPRIL-HYDROCHLOROTHIAZIDE 20-25 MG PO TABS: ORAL_TABLET | ORAL | Status: DC

## 2016-04-01 MED ORDER — TRAZODONE HCL 50 MG PO TABS: 50.0000 mg | ORAL_TABLET | Freq: Every day | ORAL | Status: DC
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
## 2016-04-01 NOTE — Patient Instructions (Addendum)
IF you received an x-ray today, you will receive an invoice from [facility]. Please contact [facility] at [phone] with questions or concerns regarding your invoice.   IF you received labwork today, you will receive an invoice from [facility]. Please contact [facility] at [phone] with questions or concerns regarding your invoice.   Our billing staff will not be able to assist you with questions regarding bills from these companies.  You will be contacted with the lab results as soon as they are available. The fastest way to get your results is to activate your My Chart account. Instructions are located on the last page of this paperwork. If you have not heard from us regarding the results in 2 weeks, please contact this office.    We recommend that you schedule a mammogram for breast cancer screening. Typically, you do not need a referral to do this. Please contact a local imaging center to schedule your mammogram.  [facility] - [phone]) [phone]  *ask for the Radiology Department [facility]( Imaging) - [phone]) [phone] or [phone](336) [phone]  [facility] - [phone] [facility] - [phone] [facility] - [phone](336) [phone]  *ask for the Radiology Department [facility] - [phone](336) [phone]  *ask for the Radiology Department [facility] - [phone]) [phone]  *ask for the Mammography Department [facility] - [phone]) [phone]   Your blood pressure medication was refilled today, but please return in the next 6 weeks fasting to discuss your medication further.  You do have a history of hyperlipidemia, and will need to restart your cholesterol medicine.  Recheck in the next 6 weeks and will need to check cholesterol testing at that time.  I recommend against combining the over-the-counter sleep aids with the trazodone. I will refer you to a neurologist for your  blacking out episodes, and they may also be able to discuss your sleep difficulties or a sleep study may be needed. Recommend against driving until you are evaluated by neurology and cleared to do so.   If your symptoms occur again, recommend he be seen in the emergency room.  If sure you're drinking sufficient fluids, as above I recommend against combining the trazodone with multiple sleep medications as you have been doing.    For congestion and cough, this likely was initially a virus. With the sinus congestion and pressure, you may have an early sinus infection. If not improving in the next few days, can start the azithromycin as instructed. If you do start the azithromycin, do not take your trazodone for 10 days as a combination of both could cause a heart arrhythmia. Saline nasal spray, Mucinex is okay to use over-the-counter.  Your eye does not appear to be bacterial pinkeye at this time. Use artificial eyedrops such as Systane or Blink, but if crusting persists throughout the day, would consider antibiotic eyedrop.  I printed this for you, but would not start it unless you're not improving the next few days.  Return to the clinic or go to the nearest emergency room if any of your symptoms worsen or new symptoms occur.  Upper Respiratory Infection, Adult Most upper respiratory infections (URIs) are a viral infection of the air passages leading to the lungs. A URI affects the nose, throat, and upper air passages. The most common type of URI is nasopharyngitis and is typically referred to as "the common cold." URIs run their course and usually go away  on their own. Most of the time, a URI does not require medical attention, but sometimes a bacterial infection in the upper airways can follow a viral infection. This is called a secondary infection. Sinus and middle ear infections are common types of secondary upper respiratory infections. Bacterial pneumonia can also complicate a URI. A URI can worsen  asthma and chronic obstructive pulmonary disease (COPD). Sometimes, these complications can require emergency medical care and may be life threatening.  CAUSES Almost all URIs are caused by viruses. A virus is a type of germ and can spread from one person to another.  RISKS FACTORS You may be at risk for a URI if:   You smoke.   You have chronic heart or lung disease.  You have a weakened defense (immune) system.   You are very young or very old.   You have nasal allergies or asthma.  You work in crowded or poorly ventilated areas.  You work in health care facilities or schools. SIGNS AND SYMPTOMS  Symptoms typically develop 2-3 days after you come in contact with a cold virus. Most viral URIs last 7-10 days. However, viral URIs from the influenza virus (flu virus) can last 14-18 days and are typically more severe. Symptoms may include:   Runny or stuffy (congested) nose.   Sneezing.   Cough.   Sore throat.   Headache.   Fatigue.   Fever.   Loss of appetite.   Pain in your forehead, behind your eyes, and over your cheekbones (sinus pain).  Muscle aches.  DIAGNOSIS  Your health care provider may diagnose a URI by:  Physical exam.  Tests to check that your symptoms are not due to another condition such as:  Strep throat.  Sinusitis.  Pneumonia.  Asthma. TREATMENT  A URI goes away on its own with time. It cannot be cured with medicines, but medicines may be prescribed or recommended to relieve symptoms. Medicines may help:  Reduce your fever.  Reduce your cough.  Relieve nasal congestion. HOME CARE INSTRUCTIONS   Take medicines only as directed by your health care provider.   Gargle warm saltwater or take cough drops to comfort your throat as directed by your health care provider.  Use a warm mist humidifier or inhale steam from a shower to increase air moisture. This may make it easier to breathe.  Drink enough fluid to keep your urine  clear or pale yellow.   Eat soups and other clear broths and maintain good nutrition.   Rest as needed.   Return to work when your temperature has returned to normal or as your health care provider advises. You may need to stay home longer to avoid infecting others. You can also use a face mask and careful hand washing to prevent spread of the virus.  Increase the usage of your inhaler if you have asthma.   Do not use any tobacco products, including cigarettes, chewing tobacco, or electronic cigarettes. If you need help quitting, ask your health care provider. PREVENTION  The best way to protect yourself from getting a cold is to practice good hygiene.   Avoid oral or hand contact with people with cold symptoms.   Wash your hands often if contact occurs.  There is no clear evidence that vitamin C, vitamin E, echinacea, or exercise reduces the chance of developing a cold. However, it is always recommended to get plenty of rest, exercise, and practice good nutrition.  SEEK MEDICAL CARE IF:   You are  getting worse rather than better.   Your symptoms are not controlled by medicine.   You have chills.  You have worsening shortness of breath.  You have brown or red mucus.  You have yellow or brown nasal discharge.  You have pain in your face, especially when you bend forward.  You have a fever.  You have swollen neck glands.  You have pain while swallowing.  You have white areas in the back of your throat. SEEK IMMEDIATE MEDICAL CARE IF:   You have severe or persistent:  Headache.  Ear pain.  Sinus pain.  Chest pain.  You have chronic lung disease and any of the following:  Wheezing.  Prolonged cough.  Coughing up blood.  A change in your usual mucus.  You have a stiff neck.  You have changes in your:  Vision.  Hearing.  Thinking.  Mood. MAKE SURE YOU:   Understand these instructions.  Will watch your condition.  Will get help right  away if you are not doing well or get worse.   This information is not intended to replace advice given to you by your health care provider. Make sure you discuss any questions you have with your health care provider.   Document Released: 03/22/2001 Document Revised: 02/10/2015 Document Reviewed: 01/01/2014 Elsevier Interactive Patient Education [employer]

## 2016-08-28 ENCOUNTER — Encounter (HOSPITAL_COMMUNITY): Payer: Self-pay | Admitting: Emergency Medicine

## 2016-08-28 ENCOUNTER — Ambulatory Visit (HOSPITAL_COMMUNITY)
Admission: EM | Admit: 2016-08-28 | Discharge: 2016-08-28 | Disposition: A | Discharge status: Home / Self Care | Payer: 59

## 2016-08-28 DIAGNOSIS — B9789 Other viral agents as the cause of diseases classified elsewhere: Secondary | ICD-10-CM | POA: Diagnosis not present

## 2016-08-28 DIAGNOSIS — R05 Cough: Secondary | ICD-10-CM

## 2016-08-28 DIAGNOSIS — R059 Cough, unspecified: Secondary | ICD-10-CM

## 2016-08-28 DIAGNOSIS — J069 Acute upper respiratory infection, unspecified: Secondary | ICD-10-CM

## 2016-08-28 NOTE — ED Triage Notes (Signed)
[facility] a [facility] of a productive cough and nasal congestion x 2 days.

## 2016-08-28 NOTE — ED Provider Notes (Signed)
CSN: [id number]     Arrival date & time 08/28/16  1603 History   First MD Initiated Contact with Patient 08/28/16 1814     Chief Complaint  Patient presents with  . Cough   (Consider location/radiation/quality/duration/timing/severity/associated sxs/prior Treatment)  HPI   The patient is a 60 year old reporting cold symptoms following a flu shot earlier this week.  She states shes had cough, sore throat and generalized malaise.  Denies fever, n/v/d.  Past Medical History:  Diagnosis Date  . Allergy   . Anxiety   . Arthritis   . Depression   . Hyperlipidemia   . Hypertension    Past Surgical History:  Procedure Laterality Date  . FRACTURE SURGERY    . fx. R ankle    . TUBAL LIGATION     Family History  Problem Relation Age of Onset  . Ovarian cancer Mother   . Cancer Mother     ovarian  . Heart failure Father   . Colon cancer Father   . Hypertension Father   . Cancer Father     prostate  . Hypertension Sister   . Hyperlipidemia Sister   . Diabetes Maternal Grandmother   . Heart attack Maternal Grandfather    Social History  Substance Use Topics  . Smoking status: Never Smoker  . Smokeless tobacco: Never Used  . Alcohol use No   OB History    No data available     Review of Systems  Constitutional: Positive for fatigue.  HENT: Positive for congestion and sore throat. Negative for ear discharge, ear pain, sinus pain, sinus pressure, sneezing, trouble swallowing and voice change.   Eyes: Negative.   Respiratory: Positive for cough. Negative for choking, shortness of breath, wheezing and stridor.   Cardiovascular: Negative.  Negative for chest pain.  Gastrointestinal: Negative.  Negative for diarrhea, nausea and vomiting.  Endocrine: Negative.   Genitourinary: Negative.   Musculoskeletal: Negative.   Allergic/Immunologic: Negative.   Neurological: Negative.   Hematological: Negative.   Psychiatric/Behavioral: Negative.     Allergies   Clindamycin/lincomycin and Penicillins  Home Medications   Prior to Admission medications   Medication Sig Start Date End Date Taking? Authorizing Provider  Ascorbic Acid (VITAMIN C) 1000 MG tablet Take 1,000 mg by mouth daily.   Yes Historical Provider, MD  calcium citrate (CALCITRATE - DOSED IN MG ELEMENTAL CALCIUM) 950 MG tablet Take 1 tablet by mouth 2 (two) times daily.   Yes Historical Provider, MD  citalopram (CELEXA) 40 MG tablet Take 1 tablet daily for anxiety and depression 04/01/16  Yes Shade FloodJeffrey R Greene, MD  fish oil-omega-3 fatty acids 1000 MG capsule Take 1 g by mouth 2 (two) times daily.    Yes Historical Provider, MD  lisinopril-hydrochlorothiazide (PRINZIDE,ZESTORETIC) 20-25 MG tablet TAKE ONE TABLET BY MOUTH ONCE DAILY. 04/01/16  Yes Shade FloodJeffrey R Greene, MD  Multiple Vitamin (MULTIVITAMIN) tablet Take 1 tablet by mouth daily.   Yes Historical Provider, MD  traZODone (DESYREL) 50 MG tablet Take 1-2 tablets (50-100 mg total) by mouth at bedtime. 04/01/16  Yes Shade FloodJeffrey R Greene, MD  azithromycin (ZITHROMAX) 250 MG tablet Take 2 pills by mouth on day 1, then 1 pill by mouth per day on days 2 through 5. 04/01/16   Shade FloodJeffrey R Greene, MD  Doxylamine Succinate, Sleep, (SLEEP AID PO) Take 3 tablets by mouth at bedtime.    Historical Provider, MD  pravastatin (PRAVACHOL) 40 MG tablet TAKE ONE TABLET BY MOUTH ONCE DAILY. 04/01/16  Shade FloodJeffrey R Greene, MD  trimethoprim-polymyxin b (POLYTRIM) ophthalmic solution Place 1 drop into the left eye every 4 (four) hours. 04/01/16   Shade FloodJeffrey R Greene, MD   Meds Ordered and Administered this Visit  Medications - No data to display  BP 135/90 (BP Location: Right Arm)   Pulse 74   Temp 98.4 F (36.9 C) (Oral)   Resp 12   SpO2 99%  No data found.   Physical Exam  Constitutional: She appears well-developed and well-nourished. No distress.  HENT:  Head: Normocephalic and atraumatic.  Right Ear: External ear normal.  Left Ear: External ear normal.   Nose: Nose normal.  Mouth/Throat: Oropharynx is clear and moist. No oropharyngeal exudate.  Eyes: Conjunctivae are normal. Pupils are equal, round, and reactive to light. Right eye exhibits no discharge. Left eye exhibits no discharge. No scleral icterus.  Neck: Normal range of motion. Neck supple. No thyromegaly present.  Negative nuchal rigidity.  Cardiovascular: Normal rate, regular rhythm, normal heart sounds and intact distal pulses.  Exam reveals no gallop and no friction rub.   No murmur heard. Pulmonary/Chest: Effort normal and breath sounds normal. No respiratory distress. She has no wheezes. She has no rales. She exhibits no tenderness.  Lymphadenopathy:    She has no cervical adenopathy.  Skin: She is not diaphoretic.  Nursing note and vitals reviewed.   Urgent Care Course   Clinical Course     Procedures (including critical care time)  Labs Review Labs Reviewed - No data to display  Imaging Review No results found.     MDM   1. Viral URI with cough   2. Cough    Discussed that it is good to have some response following a flu shot.  Comfort measures.  Rest, push fluids and tylenol. The usual and customary discharge instructions and warnings were given.  The patient verbalizes understanding and agrees to plan of care.       Servando Salinaatherine H Kaire Stary, NP 08/28/16 2133

## 2016-08-28 NOTE — Discharge Instructions (Signed)
Follow up with your regular care provider for medication refills.

## 2016-10-19 ENCOUNTER — Ambulatory Visit (INDEPENDENT_AMBULATORY_CARE_PROVIDER_SITE_OTHER): Payer: BLUE CROSS/BLUE SHIELD | Admitting: Family Medicine

## 2016-10-19 DIAGNOSIS — E785 Hyperlipidemia, unspecified: Secondary | ICD-10-CM | POA: Diagnosis not present

## 2016-10-19 DIAGNOSIS — I10 Essential (primary) hypertension: Secondary | ICD-10-CM | POA: Diagnosis not present

## 2016-10-19 DIAGNOSIS — G4700 Insomnia, unspecified: Secondary | ICD-10-CM

## 2016-10-19 DIAGNOSIS — R42 Dizziness and giddiness: Secondary | ICD-10-CM | POA: Diagnosis not present

## 2016-10-19 DIAGNOSIS — G4709 Other insomnia: Secondary | ICD-10-CM | POA: Diagnosis not present

## 2016-10-19 DIAGNOSIS — Z760 Encounter for issue of repeat prescription: Secondary | ICD-10-CM | POA: Diagnosis not present

## 2016-10-19 MED ORDER — CITALOPRAM HYDROBROMIDE 40 MG PO TABS: ORAL_TABLET | ORAL | 1 refills | Status: DC

## 2016-10-19 MED ORDER — TRAZODONE HCL 50 MG PO TABS: ORAL_TABLET | ORAL | 2 refills | Status: DC

## 2016-10-19 NOTE — Patient Instructions (Addendum)
It was nice meeting you today Ms. Connie Wilson!  Please begin taking one and a half tablets of Tramadol at night like we discussed. I would not recommend taking Benadryl or Unisom with this, otherwise you could have more episodes of passing out.   Below are some tips to help you sleep better: - Make sure you are sleeping in a dark, cool room. You could consider buying blackout curtains or an eye mask if your room is not very dark.  - Make sure your room is quiet and free of distractions. If there are any distracting noises, you can buy a white noise machine or download a free white noise app on your phone to play while you are asleep.  - Do not drink caffeine after lunchtime.  - Do not look at any type of screen (tablet, phone, computer, TV) for at least 30 minutes before bed.  - Try to go to bed and wake up in the morning at the same time every day. Since your work schedule differs, this will not be exact for you, however you could try to minimize the time range between times.   Your dizziness seems most likely due to orthostatic hypotension, or a drop in your blood pressure when standing up. To prevent this from happening, sit up, get out of bed, and stand up slowly. Also try to turn slowly. When you feel dizzy, sit or lay down until the symptoms pass.   We will see you back in one month to assess your dizziness and discuss your sleep.   If you have any questions or concerns, please feel free to call the clinic.   Be well,  Dr. Natale MilchLancaster    IF you received an x-ray today, you will receive an invoice from [facility]. Please contact [facility] at [phone] with questions or concerns regarding your invoice.   IF you received labwork today, you will receive an invoice from [facility]. Please contact [facility] at [phone] with questions or concerns regarding your invoice.   Our billing staff will not be able to assist you with questions regarding bills from these  companies.  You will be contacted with the lab results as soon as they are available. The fastest way to get your results is to activate your My Chart account. Instructions are located on the last page of this paperwork. If you have not heard from us regarding the results in 2 weeks, please contact this office.

## 2016-10-19 NOTE — Assessment & Plan Note (Signed)
Most consistent with orthostatic hypotension, as only occurring immediately after patient stands up and makes a sudden turn. BP WNL in office today. Less likely vertiginous as very mild and brief episodes. Subclavian steal and vertebrobasilar insufficiency also on differential. Discussed standing up slower and moving less suddenly with patient to prevent further episodes.  - F/u in one month - If symptoms persist, consider workup for vertigo or referral to neurology

## 2016-10-19 NOTE — Assessment & Plan Note (Signed)
Self-discontinued pravastatin due to concern for side effects.  - Patient to return in two days for fasting cholesterol panel

## 2016-10-19 NOTE — Assessment & Plan Note (Signed)
Discussed with patient the risks of continuing to take a sedative medication nightly, especially given her history of apparent syncope and now dizziness. Specifically discussed sleep hygiene, and the fact that patient uses a tablet until she falls asleep and has a very irregular sleep schedule, both of which are likely a detriment to her sleep quality. Patient agreeable to continuing to wean trazodone.  - Begin to wean trazodone to 1.5 tablets per night. Refill written for trazodone 50mg  #45 - Information given regarding sleep hygiene techniques - Patient encouraged not to use tablet before bed - F/u in one month with plans to wean to one trazodone tablet before bed

## 2016-10-19 NOTE — Progress Notes (Signed)
Subjective:    Patient ID: Connie Wilson, female    DOB: 03-31-1956, 61 y.o.   MRN: [id number]  HPI  Patient presents to discuss dizziness, insomnia, and depression/anxiety.   Dizziness Occurs intermittently, at least once a week. Only occurs immediately after patient stands up and makes a sudden turn. Patient has never fallen or lost consciousness. She usually continues with whatever she was doing, so is unsure if sitting or laying down alleviates her symptoms. Of note, patient was having episodes of syncope when last seen six months ago. At that time she was taking trazodone, Benadryl, and Unisom. Patient stopped taking Benadryl and Unisom and has not had any more syncopal episodes.  Patient does have a history of HTN for which she takes lisinopril-HCTZ. Denies prior diagnosis of hypotension.   Insomnia Currently taking two trazodone at night. Not taking Unisom and Benadryl in addition to trazodone anymore. Has trouble falling and staying asleep. Sleep schedule varies depending on work, but she usually goes to bed between 9-11PM. Wakes up between 6:30-11AM. Eats Ramen noodles with crackers in it when she takes her medication right before bed every night. Does not drink caffeine after lunchtime. Says bedroom is dark and quiet. Patient lives in a house. She does watch shows or do activities on her tablet in bed until she falls asleep nightly.   Depression and anxiety Generally well-controlled, though has felt more "edgy" and anxious recently. Says she is currently more stressed than she usually is. She is taking care of her ex-husband who was diagnosed with lung cancer and recently had a lobectomy. In addition to her ex-husband, her grandson with ADHD recently moved into her house, which she says adds to her stress. Finally, the number of patients she sees at work has recently increased (patient is a caregiver to geriatric patients). She does not see a psychiatrist or a psychologist.    HTN Currently taking lisinopril-HCTZ. Does not check BP at home. Denies recent headaches or changes in vision.   HLD Is prescribed pravastatin, but self-discontinued as she said it was affecting her memory.   Review of Systems See HPI.     Objective:   Physical Exam  Constitutional: She is oriented to person, place, and time. She appears well-developed and well-nourished. No distress.  HENT:  Head: Normocephalic and atraumatic.  Eyes: Conjunctivae and EOM are normal. Pupils are equal, round, and reactive to light. Right eye exhibits no discharge. Left eye exhibits no discharge.  Cardiovascular: Normal rate, regular rhythm and normal heart sounds.   No murmur heard. Pulmonary/Chest: Effort normal and breath sounds normal. No respiratory distress. She has no wheezes.  Neurological: She is alert and oriented to person, place, and time. Coordination normal.  Skin: Skin is warm and dry.  Psychiatric: She has a normal mood and affect. Her behavior is normal.      Assessment & Plan:  HTN (hypertension) Currently well-controlled on lisinopril-HCTZ 20-25mg  qd. BP 124/72 in office today. Does not need med refill today.  - Return for lab visit in two days for BMP - Could consider decreasing medication if patient's dizziness continues and BP is still below goal  Hyperlipidemia Self-discontinued pravastatin due to concern for side effects.  - Patient to return in two days for fasting cholesterol panel  Insomnia Discussed with patient the risks of continuing to take a sedative medication nightly, especially given her history of apparent syncope and now dizziness. Specifically discussed sleep hygiene, and the fact that patient uses a tablet until  she falls asleep and has a very irregular sleep schedule, both of which are likely a detriment to her sleep quality. Patient agreeable to continuing to wean trazodone.  - Begin to wean trazodone to 1.5 tablets per night. Refill written for trazodone  50mg  #45 - Information given regarding sleep hygiene techniques - Patient encouraged not to use tablet before bed - F/u in one month with plans to wean to one trazodone tablet before bed   Dizziness Most consistent with orthostatic hypotension, as only occurring immediately after patient stands up and makes a sudden turn. BP WNL in office today. Less likely vertiginous as very mild and brief episodes. Subclavian steal and vertebrobasilar insufficiency also on differential. Discussed standing up slower and moving less suddenly with patient to prevent further episodes.  - F/u in one month - If symptoms persist, consider workup for vertigo or referral to neurology  Tarri Abernethy, MD, MPH PGY-2 [facility] Pager [phone]

## 2016-10-19 NOTE — Assessment & Plan Note (Signed)
Currently well-controlled on lisinopril-HCTZ 20-25mg  qd. BP 124/72 in office today. Does not need med refill today.  - Return for lab visit in two days for BMP - Could consider decreasing medication if patient's dizziness continues and BP is still below goal

## 2016-10-21 ENCOUNTER — Other Ambulatory Visit: Payer: BLUE CROSS/BLUE SHIELD

## 2016-12-29 ENCOUNTER — Ambulatory Visit (INDEPENDENT_AMBULATORY_CARE_PROVIDER_SITE_OTHER): Payer: Self-pay | Admitting: Physician Assistant

## 2016-12-29 ENCOUNTER — Encounter: Payer: Self-pay | Admitting: Physician Assistant

## 2016-12-29 VITALS — BP 110/70 | HR 80 | Ht 68.0 in | Wt 191.5 lb

## 2016-12-29 DIAGNOSIS — Z800 Family history of malignant neoplasm of digestive organs: Secondary | ICD-10-CM

## 2016-12-29 DIAGNOSIS — K5900 Constipation, unspecified: Secondary | ICD-10-CM

## 2016-12-29 MED ORDER — NA SULFATE-K SULFATE-MG SULF 17.5-3.13-1.6 GM/177ML PO SOLN: 1.0000 | Freq: Once | ORAL | 0 refills | Status: AC

## 2016-12-29 NOTE — Progress Notes (Signed)
Subjective:    Patient ID: Connie Wilson, female    DOB: 1956/09/17, 61 y.o.   MRN: [id number]  HPI  Connie Wilson is a pleasant 61 year old white female known to Dr. Fuller Plan. She was last seen in our office in February 2017 for follow-up of relapsing C. difficile colitis. She comes in today to discuss colonoscopy and changes in her bowel habits. She did have one prior colonoscopy done at age 9 per Dr. Fuller Plan which was unremarkable. She does have family history of colon cancer in her father. We had scheduled colonoscopy at the time of her last office visit but she did not follow through with that. Patient says over the past several months she has had some episodes of loose stools and then other days has pellet-like stools. She says sometimes she will have a small amount of stool when urinating. She is having bowel movements daily but does not feel she's emptying her bowel because of the small pellet-like stools. She has not noted any bleeding. She has had occasional episodes of sharp epigastric pain 20-30 minutes postprandially but says these are very infrequent. Her appetite is been fine her weight has been stable. CT of the abdomen and pelvis done in 2017 did not show any evidence of cholelithiasis.  Review of Systems  Pertinent positive and negative review of systems were noted in the above HPI section.  All other review of systems was otherwise negative.  Outpatient Encounter Prescriptions as of 12/29/2016  Medication Sig  . Ascorbic Acid (VITAMIN C) 1000 MG tablet Take 1,000 mg by mouth daily.  . calcium citrate (CALCITRATE - DOSED IN MG ELEMENTAL CALCIUM) 950 MG tablet Take 1 tablet by mouth 2 (two) times daily.  . citalopram (CELEXA) 40 MG tablet Take 1 tablet daily for anxiety and depression  . fish oil-omega-3 fatty acids 1000 MG capsule Take 1 g by mouth 2 (two) times daily.   Marland Kitchen lisinopril-hydrochlorothiazide (PRINZIDE,ZESTORETIC) 20-25 MG tablet TAKE ONE TABLET BY MOUTH ONCE DAILY.  . Multiple  Vitamin (MULTIVITAMIN) tablet Take 1 tablet by mouth daily.  . traZODone (DESYREL) 50 MG tablet Take 1.5 tabs po qhs for sleep  . Na Sulfate-K Sulfate-Mg Sulf 17.5-3.13-1.6 GM/180ML SOLN Take 1 kit by mouth once.   No facility-administered encounter medications on file as of 12/29/2016.    Allergies  Allergen Reactions  . Clindamycin/Lincomycin   . Penicillins Rash    Has patient had a PCN reaction causing immediate rash, facial/tongue/throat swelling, SOB or lightheadedness with hypotension: No (pt  Did have rash) Has patient had a PCN reaction causing severe rash involving mucus membranes or skin necrosis: No Has patient had a PCN reaction that required hospitalization: No Has patient had a PCN reaction occurring within the last 10 years:No If all of the above answers are "NO", then may proceed with Cephalosporin use.    Patient Active Problem List   Diagnosis Date Noted  . Insomnia 10/19/2016  . Dizziness 10/19/2016  . Recurrent Clostridium difficile diarrhea 11/12/2015  . Elevated glucose 09/29/2012  . HTN (hypertension) 01/06/2012  . Hyperlipidemia 01/06/2012   Social History   Social History  . Marital status: Divorced    Spouse name: N/A  . Number of children: 2  . Years of education: N/A   Occupational History  . Not on file.   Social History Main Topics  . Smoking status: Never Smoker  . Smokeless tobacco: Never Used  . Alcohol use No  . Drug use: No  . Sexual activity: Not  Currently    Birth control/ protection: Post-menopausal   Other Topics Concern  . Not on file   Social History Narrative  . No narrative on file    Ms. Melchor family history includes Cancer in her father, mother, and sister; Colon cancer in her father; Diabetes in her maternal grandmother; Heart attack in her maternal grandfather; Heart failure in her father; Hyperlipidemia in her sister; Hypertension in her father and sister; Ovarian cancer in her mother.      Objective:      Vitals:   12/29/16 0955  BP: 110/70  Pulse: 80    Physical Exam  well-developed white female in no acute distress, blood pressure 110/70, pulse 80, height 5 foot 8, weight 191, BMI 29.1. HEENT ;nontraumatic normocephalic EOMI PERRLA sclera anicteric, Cardiovascular; regular rate and rhythm with S1-S2 no murmur or gallop, Pulmonary; clear bilaterally, Abdomen ;soft nontender nondistended bowel sounds are active there is no palpable mass or hepatosplenomegaly, Rectal ;exam not done, Ext; no clubbing cyanosis or edema skin warm and dry, Neuropsych; mood and affect appropriate       Assessment & Plan:   #87  61 year old female with family history of colon cancer, overdue for follow-up colonoscopy. #2 mild constipation #3 intermittent episodes of epigastric pain-infrequent patient does not want to pursue further workup at this time #4 history of relapsed C. difficile colitis 2016 and 2017. #5 hypertension  Plan; push high-fiber diet, and increase water intake, add Benefiber daily. Discussed when necessary MiraLAX use but she does not wish to get started on this for fear of becoming dependent. Will schedule for colonoscopy with Dr. Fuller Plan. Procedure discussed in detail with patient including risks and benefits and she is agreeable to proceed.  Izear Pine S Khushbu Pippen PA-C 12/29/2016   Cc: No ref. provider found

## 2016-12-29 NOTE — Progress Notes (Signed)
Reviewed and agree with management plan.  Malcolm T. Stark, MD FACG 

## 2016-12-29 NOTE — Patient Instructions (Signed)
You have been scheduled for a colonoscopy. Please follow written instructions given to you at your visit today.  Please pick up your prep supplies at the pharmacy within the next 1-3 days. If you use inhalers (even only as needed), please bring them with you on the day of your procedure. Your physician has requested that you go to www.startemmi.com and enter the access code given to you at your visit today. This web site gives a general overview about your procedure. However, you should still follow specific instructions given to you by our office regarding your preparation for the procedure.   Add benefiber daily  Increase water to 8 glasses a day  Eat a high fiber diet

## 2017-01-23 ENCOUNTER — Encounter: Payer: Self-pay | Admitting: Nurse Practitioner

## 2017-01-23 ENCOUNTER — Ambulatory Visit (INDEPENDENT_AMBULATORY_CARE_PROVIDER_SITE_OTHER): Payer: BLUE CROSS/BLUE SHIELD | Admitting: Nurse Practitioner

## 2017-01-23 VITALS — BP 118/74 | HR 79 | Temp 97.5°F | Ht 69.0 in | Wt 187.0 lb

## 2017-01-23 DIAGNOSIS — Z124 Encounter for screening for malignant neoplasm of cervix: Secondary | ICD-10-CM | POA: Diagnosis not present

## 2017-01-23 DIAGNOSIS — F332 Major depressive disorder, recurrent severe without psychotic features: Secondary | ICD-10-CM | POA: Diagnosis not present

## 2017-01-23 DIAGNOSIS — J0140 Acute pansinusitis, unspecified: Secondary | ICD-10-CM

## 2017-01-23 DIAGNOSIS — I10 Essential (primary) hypertension: Secondary | ICD-10-CM

## 2017-01-23 MED ORDER — SALINE SPRAY 0.65 % NA SOLN: 1.0000 | NASAL | 0 refills | Status: DC | PRN

## 2017-01-23 MED ORDER — FLUTICASONE PROPIONATE 50 MCG/ACT NA SUSP: 2.0000 | Freq: Every day | NASAL | 0 refills | Status: DC

## 2017-01-23 MED ORDER — VILAZODONE HCL 10 & 20 MG PO KIT: 1.0000 | PACK | Freq: Every day | ORAL | 0 refills | Status: DC

## 2017-01-23 MED ORDER — DOXYCYCLINE HYCLATE 100 MG PO TABS: 100.0000 mg | ORAL_TABLET | Freq: Two times a day (BID) | ORAL | 0 refills | Status: AC

## 2017-01-23 NOTE — Progress Notes (Signed)
Pre visit review using our clinic review tool, if applicable. No additional management support is needed unless otherwise documented below in the visit note. 

## 2017-01-23 NOTE — Patient Instructions (Addendum)
Call insurance about [employer] coverage.

## 2017-01-23 NOTE — Progress Notes (Signed)
Subjective:    Patient ID: Connie Wilson, female    DOB: [birth year], 61 y.o.   MRN: [id number]  Patient presents today for establish care (new patient).  HPI   Depression:  Current use of celexa. Ongoing for >10years. No HI or SI. GI symptoms with mood (nausea) Wants referral to psychiatrist.   Acute sinusitis: Ongoing for several weeks. No improvement with antihistamine OTC. Had post nasal drip, occasional dizziness with rapid head movement.  Insomnia: Ongoing for several years Stopped trazodone 2weeks ago. No longer effective. Has been using benadryl and tylenol PM. She does not want another medication at this time.  Syncope: Unexplained cause 2017. Did not go to neurologist as recommended. No syncope since 03/2016.  Immunizations: (TDAP, Hep C screen, Pneumovax, Influenza, zoster)  Health Maintenance  Topic Date Due  .  Hepatitis C: One time screening is recommended by Center for Disease Control  (CDC) for  adults born from [birth year] through 1965.   1955/11/04  . HIV Screening  01/27/1971  . Pap Smear  01/26/1977  . Colon Cancer Screening  01/26/2006  . Mammogram  01/23/2009  . Flu Shot  05/10/2017  . Tetanus Vaccine  09/17/2024   Weight:  Wt Readings from Last 3 Encounters:  01/23/17 187 lb (84.8 kg)  12/29/16 191 lb 8 oz (86.9 kg)  10/19/16 192 lb (87.1 kg)   Fall Risk: Fall Risk  01/23/2017 10/19/2016 04/01/2016 03/21/2014  Falls in the past year? Yes No Yes No  Number falls in past yr: 1 - 1 -  Injury with Fall? No - Yes -   Home Safety: home with daughter and grand children Depression/Suicide: Depression screen [facility] 2/9 01/23/2017 10/19/2016 04/01/2016 12/06/2015 11/28/2015 11/10/2015 10/29/2015  Decreased Interest 3 0 0 0 0 0 3  Down, Depressed, Hopeless 3 0 0 0 0 0 3  PHQ - 2 Score 6 0 0 0 0 0 6  Altered sleeping 3 - - - - - 3  Tired, decreased energy 3 - - - - - 3  Change in appetite 0 - - - - - 3  Feeling bad or failure about yourself  3 - - - - - 3    Trouble concentrating 0 - - - - - 3  Moving slowly or fidgety/restless 0 - - - - - 0  Suicidal thoughts 0 - - - - - 0  PHQ-9 Score 15 - - - - - 21  Difficult doing work/chores Not difficult at all - - - - - -   No flowsheet data found. Colonoscopy (every 5-66yr, >50-724yr:last done at age [age]normal per patient, needs repeat, scheduled for 03/01/2017. Pap Smear (every 3y11yror >21-29 without HPV, every 52yr11yrr >30-652yr31yrh HPV):needed, wants referral GYN Mammogram (yearly, >452yrs70yrded Vision:needed Dental:needed Advanced Directive: Advanced Directives 01/11/2016  Does Patient Have a Medical Advance Directive? No  Would patient like information on creating a medical advance directive? No - patient declined information    Medications and allergies reviewed with patient and updated if appropriate.  Patient Active Problem List   Diagnosis Date Noted  . Insomnia 10/19/2016  . Dizziness 10/19/2016  . Recurrent Clostridium difficile diarrhea 11/12/2015  . Elevated glucose 09/29/2012  . HTN (hypertension) 01/06/2012  . Hyperlipidemia 01/06/2012    Current Outpatient Prescriptions on File Prior to Visit  Medication Sig Dispense Refill  . Ascorbic Acid (VITAMIN C) 1000 MG tablet Take 1,000 mg by mouth daily.    . calcium citrate (CALCITRATE - DOSED IN  MG ELEMENTAL CALCIUM) 950 MG tablet Take 1 tablet by mouth 2 (two) times daily.    . fish oil-omega-3 fatty acids 1000 MG capsule Take 1 g by mouth 2 (two) times daily.     Marland Kitchen lisinopril-hydrochlorothiazide (PRINZIDE,ZESTORETIC) 20-25 MG tablet TAKE ONE TABLET BY MOUTH ONCE DAILY. 90 tablet 1  . Multiple Vitamin (MULTIVITAMIN) tablet Take 1 tablet by mouth daily.     No current facility-administered medications on file prior to visit.     Past Medical History:  Diagnosis Date  . Allergy   . Anxiety   . Arthritis   . Depression   . Hyperlipidemia   . Hypertension     Past Surgical History:  Procedure Laterality Date  .  FRACTURE SURGERY    . fx. R ankle    . TUBAL LIGATION      Social History   Social History  . Marital status: Divorced    Spouse name: N/A  . Number of children: 2  . Years of education: N/A   Social History Main Topics  . Smoking status: Never Smoker  . Smokeless tobacco: Never Used  . Alcohol use No  . Drug use: No  . Sexual activity: Not Currently    Birth control/ protection: Post-menopausal   Other Topics Concern  . None   Social History Narrative  . None    Family History  Problem Relation Age of Onset  . Ovarian cancer Mother   . Cancer - Ovarian Mother   . Heart failure Father   . Colon cancer Father   . Hypertension Father   . Prostate cancer Father   . Hypertension Sister   . Hyperlipidemia Sister   . Cancer Sister     unknown type  . Diabetes Maternal Grandmother   . Heart attack Maternal Grandfather         Review of Systems  Constitutional: Negative for fever, malaise/fatigue and weight loss.  HENT: Positive for congestion and sinus pain. Negative for sore throat.   Eyes:       Negative for visual changes  Respiratory: Negative for cough and shortness of breath.   Cardiovascular: Negative for chest pain, palpitations and leg swelling.  Gastrointestinal: Negative for blood in stool, constipation, diarrhea and heartburn.  Genitourinary: Negative for dysuria, frequency and urgency.  Musculoskeletal: Negative for falls, joint pain and myalgias.  Skin: Negative for rash.  Neurological: Positive for dizziness. Negative for sensory change and headaches.  Endo/Heme/Allergies: Does not bruise/bleed easily.  Psychiatric/Behavioral: Positive for depression. Negative for hallucinations, substance abuse and suicidal ideas. The patient is nervous/anxious and has insomnia.     Objective:   Vitals:   01/23/17 1409  BP: 118/74  Pulse: 79  Temp: 97.5 F (36.4 C)    Body mass index is 27.62 kg/m.   Physical Examination:  Physical Exam    Constitutional: She is oriented to person, place, and time and well-developed, well-nourished, and in no distress. No distress.  HENT:  Right Ear: External ear normal.  Left Ear: External ear normal.  Nose: Mucosal edema present. No rhinorrhea. Right sinus exhibits maxillary sinus tenderness. Right sinus exhibits no frontal sinus tenderness. Left sinus exhibits maxillary sinus tenderness. Left sinus exhibits no frontal sinus tenderness.  Mouth/Throat: Uvula is midline. Posterior oropharyngeal erythema present. No oropharyngeal exudate.  Eyes: Conjunctivae and EOM are normal. Pupils are equal, round, and reactive to light. No scleral icterus.  Neck: Normal range of motion. Neck supple. No thyromegaly present.  Cardiovascular: Normal rate,  normal heart sounds and intact distal pulses.   Pulmonary/Chest: Effort normal and breath sounds normal. She exhibits no tenderness.  Abdominal: Soft. Bowel sounds are normal. She exhibits no distension. There is no tenderness.  Musculoskeletal: Normal range of motion. She exhibits no edema or tenderness.  Lymphadenopathy:    She has no cervical adenopathy.  Neurological: She is alert and oriented to person, place, and time. Gait normal.  Skin: Skin is warm and dry.  Psychiatric: Affect and judgment normal.  Vitals reviewed.   ASSESSMENT and PLAN:  Dalyah was seen today for establish care.  Diagnoses and all orders for this visit:  Acute non-recurrent pansinusitis -     doxycycline (VIBRA-TABS) 100 MG tablet; Take 1 tablet (100 mg total) by mouth 2 (two) times daily. -     fluticasone (FLONASE) 50 MCG/ACT nasal spray; Place 2 sprays into both nostrils daily. -     sodium chloride (OCEAN) 0.65 % SOLN nasal spray; Place 1 spray into both nostrils as needed for congestion.  Essential hypertension  Severe episode of recurrent major depressive disorder, without psychotic features ([place]) -     Ambulatory referral to Psychiatry -     Vilazodone HCl  (VIIBRYD STARTER PACK) 10 & 20 MG KIT; Take 1 tablet by mouth daily.  Encounter for Papanicolaou smear for cervical cancer screening -     Ambulatory referral to Gynecology   No problem-specific Assessment & Plan notes found for this encounter.     Follow up: Return in about 2 weeks (around 02/06/2017) for depression and CPE (fasting, 56mns appt).  CWilfred Lacy NP

## 2017-01-26 ENCOUNTER — Other Ambulatory Visit: Payer: Self-pay | Admitting: Family Medicine

## 2017-01-26 DIAGNOSIS — I10 Essential (primary) hypertension: Secondary | ICD-10-CM

## 2017-02-06 ENCOUNTER — Other Ambulatory Visit: Payer: BLUE CROSS/BLUE SHIELD

## 2017-02-06 ENCOUNTER — Encounter: Payer: Self-pay | Admitting: Nurse Practitioner

## 2017-02-06 ENCOUNTER — Ambulatory Visit (INDEPENDENT_AMBULATORY_CARE_PROVIDER_SITE_OTHER): Payer: BLUE CROSS/BLUE SHIELD | Admitting: Nurse Practitioner

## 2017-02-06 VITALS — BP 122/80 | HR 74 | Temp 97.5°F | Ht 69.0 in | Wt 189.0 lb

## 2017-02-06 DIAGNOSIS — F332 Major depressive disorder, recurrent severe without psychotic features: Secondary | ICD-10-CM | POA: Diagnosis not present

## 2017-02-06 DIAGNOSIS — R1084 Generalized abdominal pain: Secondary | ICD-10-CM

## 2017-02-06 DIAGNOSIS — I10 Essential (primary) hypertension: Secondary | ICD-10-CM

## 2017-02-06 DIAGNOSIS — R140 Abdominal distension (gaseous): Secondary | ICD-10-CM | POA: Diagnosis not present

## 2017-02-06 MED ORDER — VILAZODONE HCL 40 MG PO TABS: 40.0000 mg | ORAL_TABLET | Freq: Every day | ORAL | 3 refills | Status: DC

## 2017-02-06 NOTE — Progress Notes (Signed)
Subjective:  Patient ID: Connie Wilson, female    DOB: 1956-01-21  Age: 61 y.o. MRN: [id number]  CC: Follow-up (2 wk fu/med not working)   Depression       The patient presents with depression.  This is a chronic problem.  The onset quality is undetermined.   The problem occurs constantly.  The problem has been gradually improving since onset.  Associated symptoms include decreased concentration, fatigue, insomnia, restlessness, decreased interest, indigestion and sad.  Associated symptoms include no suicidal ideas.     The symptoms are aggravated by family issues and work stress.  Past treatments include SSRIs - Selective serotonin reuptake inhibitors.  Compliance with treatment is variable.  Past compliance problems include medication issues.  Previous treatment provided moderate relief.  Risk factors include a change in medication usage/dosage and family history of mental illness.   Past medical history includes depression.     Pertinent negatives include no suicide attempts. reports some improvement in her mood but will like dose increased.  GERD: She is also requesting h. Pylori test due to chronic bloating and heartburn. Does not use any PPi or h2 blocker regularly.  Outpatient Medications Prior to Visit  Medication Sig Dispense Refill  . Ascorbic Acid (VITAMIN C) 1000 MG tablet Take 1,000 mg by mouth daily.    . calcium citrate (CALCITRATE - DOSED IN MG ELEMENTAL CALCIUM) 950 MG tablet Take 1 tablet by mouth 2 (two) times daily.    . fish oil-omega-3 fatty acids 1000 MG capsule Take 1 g by mouth 2 (two) times daily.     . fluticasone (FLONASE) 50 MCG/ACT nasal spray Place 2 sprays into both nostrils daily. 16 g 0  . lisinopril-hydrochlorothiazide (PRINZIDE,ZESTORETIC) 20-25 MG tablet TAKE ONE TABLET BY MOUTH ONCE DAILY 30 tablet 0  . Multiple Vitamin (MULTIVITAMIN) tablet Take 1 tablet by mouth daily.    . sodium chloride (OCEAN) 0.65 % SOLN nasal spray Place 1 spray into both  nostrils as needed for congestion. 15 mL 0  . Vilazodone HCl (VIIBRYD STARTER PACK) 10 & 20 MG KIT Take 1 tablet by mouth daily. 1 kit 0   No facility-administered medications prior to visit.     ROS See HPI  Objective:  BP 122/80   Pulse 74   Temp 97.5 F (36.4 C)   Ht '5\' 9"'  (1.753 m)   Wt 189 lb (85.7 kg)   SpO2 98%   BMI 27.91 kg/m   BP Readings from Last 3 Encounters:  02/06/17 122/80  01/23/17 118/74  12/29/16 110/70    Wt Readings from Last 3 Encounters:  02/06/17 189 lb (85.7 kg)  01/23/17 187 lb (84.8 kg)  12/29/16 191 lb 8 oz (86.9 kg)    Physical Exam  Constitutional: She is oriented to person, place, and time. No distress.  Cardiovascular: Normal rate and normal heart sounds.   Pulmonary/Chest: Effort normal and breath sounds normal.  Abdominal: Soft. Bowel sounds are normal. She exhibits no distension. There is no tenderness.  Musculoskeletal: She exhibits no edema.  Neurological: She is alert and oriented to person, place, and time.  Skin: Skin is warm and dry.  Vitals reviewed.   Lab Results  Component Value Date   WBC 10.2 04/01/2016   HGB 13.6 04/01/2016   HCT 39.7 04/01/2016   PLT 308 10/20/2015   GLUCOSE 91 02/06/2017   CHOL 275 (H) 02/06/2017   TRIG 149 02/06/2017   HDL 52 02/06/2017   LDLCALC 193 (H) 02/06/2017  ALT 22 11/28/2015   AST 24 11/28/2015   NA 139 02/06/2017   K 4.1 02/06/2017   CL 97 02/06/2017   CREATININE 0.78 02/06/2017   BUN 11 02/06/2017   CO2 28 02/06/2017   TSH 0.997 09/17/2014   INR 1.29 12/27/2009   HGBA1C 5.9 09/17/2014    No results found.  Assessment & Plan:   Odalis was seen today for follow-up.  Diagnoses and all orders for this visit:  Severe episode of recurrent major depressive disorder, without psychotic features ([facility]) -     Vilazodone HCl (VIIBRYD) 40 MG TABS; Take 1 tablet (40 mg total) by mouth daily.  Generalized abdominal pain -     HELICOBACTER PYLORI  ANTIBODY, IGM  Abdominal  bloating -     HELICOBACTER PYLORI  ANTIBODY, IGM   I have discontinued Ms. Hedstrom Vilazodone HCl. I am also having her start on Vilazodone HCl. Additionally, I am having her maintain her fish oil-omega-3 fatty acids, multivitamin, vitamin C, calcium citrate, fluticasone, sodium chloride, and lisinopril-hydrochlorothiazide.  Meds ordered this encounter  Medications  . Vilazodone HCl (VIIBRYD) 40 MG TABS    Sig: Take 1 tablet (40 mg total) by mouth daily.    Dispense:  30 tablet    Refill:  3    Order Specific Question:   Supervising Provider    Answer:   Cassandria Anger [[id number]]    Follow-up: Return in about 1 month (around 03/08/2017) for CPE (fasting).  Wilfred Lacy, NP

## 2017-02-06 NOTE — Progress Notes (Signed)
Pre visit review using our clinic review tool, if applicable. No additional management support is needed unless otherwise documented below in the visit note. 

## 2017-02-06 NOTE — Patient Instructions (Addendum)
Go to lab for blood draw.  Contact psychiatrist for appt.  Make appt for CPE.  Continue medications as prescribed.   Major Depressive Disorder, Adult Major depressive disorder (MDD) is a mental health condition. MDD often makes you feel sad, hopeless, or helpless. MDD can also cause symptoms in your body. MDD can affect your:  Work.  School.  Relationships.  Other normal activities. MDD can range from mild to very bad. It may occur once (single episode MDD). It can also occur many times (recurrent MDD). The main symptoms of MDD often include:  Feeling sad, depressed, or irritable most of the time.  Loss of interest. MDD symptoms also include:  Sleeping too much or too little.  Eating too much or too little.  A change in your weight.  Feeling tired (fatigue) or having low energy.  Feeling worthless.  Feeling guilty.  Trouble making decisions.  Trouble thinking clearly.  Thoughts of suicide or harming others.  Feeling weak.  Feeling agitated.  Keeping yourself from being around other people (isolation). Follow these instructions at home: Activity   Do these things as told by your doctor:  Go back to your normal activities.  Exercise regularly.  Spend time outdoors. Alcohol   Talk with your doctor about how alcohol can affect your antidepressant medicines.  Do not drink alcohol. Or, limit how much alcohol you drink.  This means no more than 1 drink a day for nonpregnant women and 2 drinks a day for men. One drink equals one of these:  12 oz of beer.  5 oz of wine.  1 oz of hard liquor. General instructions   Take over-the-counter and prescription medicines only as told by your doctor.  Eat a healthy diet.  Get plenty of sleep.  Find activities that you enjoy. Make time to do them.  Think about joining a support group. Your doctor may be able to suggest a group for you.  Keep all follow-up visits as told by your doctor. This is  important. Where to find more information:   [employer] on Mental Illness:  www.nami.org  [place] of Mental Health:  http://www.maynard.net/  National Suicide Prevention Lifeline:  (802) 157-6254. This is free, 24-hour help. Contact a doctor if:  Your symptoms get worse.  You have new symptoms. Get help right away if:  You self-harm.  You see, hear, taste, smell, or feel things that are not present (hallucinate). If you ever feel like you may hurt yourself or others, or have thoughts about taking your own life, get help right away. You can go to your nearest emergency department or call:  Your local emergency services (911 in the [place]  A suicide crisis helpline, such as the National Suicide Prevention Lifeline:  973-080-5943. This is open 24 hours a day. This information is not intended to replace advice given to you by your health care provider. Make sure you discuss any questions you have with your health care provider. Document Released: 09/07/2015 Document Revised: 06/12/2016 Document Reviewed: 06/12/2016 Elsevier Interactive Patient Education  2017 [employer].

## 2017-02-07 LAB — BASIC METABOLIC PANEL
BUN / CREAT RATIO: 14 (ref 12–28)
BUN: 11 mg/dL (ref 8–27)
CHLORIDE: 97 mmol/L (ref 96–106)
CO2: 28 mmol/L (ref 18–29)
CREATININE: 0.78 mg/dL (ref 0.57–1.00)
Calcium: 9.6 mg/dL (ref 8.7–10.3)
GFR calc Af Amer: 95 mL/min/{1.73_m2} (ref >59)
GFR calc non Af Amer: 82 mL/min/{1.73_m2} (ref >59)
Glucose: 91 mg/dL (ref 65–99)
Potassium: 4.1 mmol/L (ref 3.5–5.2)
SODIUM: 139 mmol/L (ref 134–144)

## 2017-02-07 LAB — LIPID PANEL
CHOL/HDL RATIO: 5.3 ratio — AB (ref 0.0–4.4)
CHOLESTEROL TOTAL: 275 mg/dL — AB (ref 100–199)
HDL: 52 mg/dL (ref >39)
LDL CALC: 193 mg/dL — AB (ref 0–99)
Triglycerides: 149 mg/dL (ref 0–149)
VLDL CHOLESTEROL CAL: 30 mg/dL (ref 5–40)

## 2017-02-08 ENCOUNTER — Encounter: Payer: BLUE CROSS/BLUE SHIELD | Admitting: Nurse Practitioner

## 2017-02-10 ENCOUNTER — Telehealth: Payer: Self-pay | Admitting: Nurse Practitioner

## 2017-02-10 NOTE — Telephone Encounter (Signed)
Pt called wanting to know if she can do just lab work and cancel her CPE appt, I told her no ma'am her lab work is a part of her CPE, and that her insurance would not cover just lab work, labs are apart of the physical. She does not want the physical and she told me to cancel it and hung up on me. Please cancel the labs as well.

## 2017-02-13 ENCOUNTER — Encounter: Payer: Self-pay | Admitting: Nurse Practitioner

## 2017-02-13 NOTE — Telephone Encounter (Signed)
ok 

## 2017-02-14 ENCOUNTER — Encounter: Payer: Self-pay | Admitting: Nurse Practitioner

## 2017-02-14 ENCOUNTER — Ambulatory Visit (INDEPENDENT_AMBULATORY_CARE_PROVIDER_SITE_OTHER): Payer: BLUE CROSS/BLUE SHIELD | Admitting: Nurse Practitioner

## 2017-02-14 VITALS — BP 118/74 | HR 71 | Temp 97.8°F | Ht 69.0 in | Wt 189.0 lb

## 2017-02-14 DIAGNOSIS — Z124 Encounter for screening for malignant neoplasm of cervix: Secondary | ICD-10-CM | POA: Diagnosis not present

## 2017-02-14 DIAGNOSIS — N3942 Incontinence without sensory awareness: Secondary | ICD-10-CM

## 2017-02-14 NOTE — Progress Notes (Signed)
Subjective:  Patient ID: Connie Wilson, female    DOB: 07/27/1956  Age: 61 y.o. MRN: [id number]  CC: Urinary Incontinence (feel like she urinate--lipid consult???cant tell if it is sweat or urine. spots on arm?)   HPI  Depression: Denies any adverse side effects with viibryd. Has appt with psychiatry : Dr.Arfeen 03/22/17.  Hyperlipidemia: Does not want any cholesterol lowering medication due to possible adverse side effects. She wants to diet healthy diet and exercise to lower numbers.  Urinary incontinence: Onset x 1months ago. Intermittent She experience periods of wet underwear without an warning. Denies any dysuria or frequency or hematuria or ABD pain or diarrhea or constipation or vaginal symptoms Denies any previous back injury or back pain. Denies any GYN procedures.  Outpatient Medications Prior to Visit  Medication Sig Dispense Refill  . Ascorbic Acid (VITAMIN C) 1000 MG tablet Take 1,000 mg by mouth daily.    . calcium citrate (CALCITRATE - DOSED IN MG ELEMENTAL CALCIUM) 950 MG tablet Take 1 tablet by mouth 2 (two) times daily.    . fish oil-omega-3 fatty acids 1000 MG capsule Take 1 g by mouth 2 (two) times daily.     . fluticasone (FLONASE) 50 MCG/ACT nasal spray Place 2 sprays into both nostrils daily. 16 g 0  . lisinopril-hydrochlorothiazide (PRINZIDE,ZESTORETIC) 20-25 MG tablet TAKE ONE TABLET BY MOUTH ONCE DAILY 30 tablet 0  . Multiple Vitamin (MULTIVITAMIN) tablet Take 1 tablet by mouth daily.    . sodium chloride (OCEAN) 0.65 % SOLN nasal spray Place 1 spray into both nostrils as needed for congestion. 15 mL 0  . Vilazodone HCl (VIIBRYD) 40 MG TABS Take 1 tablet (40 mg total) by mouth daily. 30 tablet 3   No facility-administered medications prior to visit.     ROS See HPI  Objective:  BP 118/74   Pulse 71   Temp 97.8 F (36.6 C)   Ht 5\' 9"  (1.753 m)   Wt 189 lb (85.7 kg)   SpO2 100%   BMI 27.91 kg/m   BP Readings from Last 3 Encounters:    02/14/17 118/74  02/06/17 122/80  01/23/17 118/74    Wt Readings from Last 3 Encounters:  02/14/17 189 lb (85.7 kg)  02/06/17 189 lb (85.7 kg)  01/23/17 187 lb (84.8 kg)    Physical Exam  Constitutional: She is oriented to person, place, and time. No distress.  Cardiovascular: Normal rate.   Pulmonary/Chest: Effort normal.  Abdominal: She exhibits no distension.  Musculoskeletal: Normal range of motion. She exhibits no edema or tenderness.  Neurological: She is alert and oriented to person, place, and time.  Skin: Skin is warm and dry. No rash noted. No erythema.  Vitals reviewed.   Lab Results  Component Value Date   WBC 10.2 04/01/2016   HGB 13.6 04/01/2016   HCT 39.7 04/01/2016   PLT 308 10/20/2015   GLUCOSE 91 02/06/2017   CHOL 275 (H) 02/06/2017   TRIG 149 02/06/2017   HDL 52 02/06/2017   LDLCALC 193 (H) 02/06/2017   ALT 22 11/28/2015   AST 24 11/28/2015   NA 139 02/06/2017   K 4.1 02/06/2017   CL 97 02/06/2017   CREATININE 0.78 02/06/2017   BUN 11 02/06/2017   CO2 28 02/06/2017   TSH 0.997 09/17/2014   INR 1.29 12/27/2009   HGBA1C 5.9 09/17/2014    No results found.  Assessment & Plan:   Connie Wilson was seen today for urinary incontinence.  Diagnoses and all orders  for this visit:  Urinary incontinence without sensory awareness -     Ambulatory referral to Urology  Encounter for Papanicolaou smear for cervical cancer screening -     Ambulatory referral to Gynecology   I am having Ms. Connie Wilson maintain her fish oil-omega-3 fatty acids, multivitamin, vitamin C, calcium citrate, fluticasone, sodium chloride, lisinopril-hydrochlorothiazide, and Vilazodone HCl.  No orders of the defined types were placed in this encounter.   Follow-up: Return if symptoms worsen or fail to improve.  Alysia Pennaharlotte Yuchen Fedor, NP

## 2017-02-14 NOTE — Progress Notes (Signed)
Pre visit review using our clinic review tool, if applicable. No additional management support is needed unless otherwise documented below in the visit note. 

## 2017-02-14 NOTE — Telephone Encounter (Signed)
Pt is aware.  

## 2017-02-15 ENCOUNTER — Encounter: Payer: Self-pay | Admitting: Gastroenterology

## 2017-02-17 ENCOUNTER — Telehealth: Payer: Self-pay | Admitting: Nurse Practitioner

## 2017-02-17 ENCOUNTER — Other Ambulatory Visit: Payer: Self-pay | Admitting: Nurse Practitioner

## 2017-02-17 DIAGNOSIS — F339 Major depressive disorder, recurrent, unspecified: Secondary | ICD-10-CM

## 2017-02-17 DIAGNOSIS — M5441 Lumbago with sciatica, right side: Secondary | ICD-10-CM

## 2017-02-17 NOTE — Telephone Encounter (Signed)
No, she did not use it [facility] going [facility] $273. She would like to go back on what she was using previously until she can go to see the phycologist.

## 2017-02-17 NOTE — Telephone Encounter (Signed)
She was given a discount card during her last office visit. Did she submit that card to her pharmacist?

## 2017-02-17 NOTE — Telephone Encounter (Signed)
Please advise 

## 2017-02-17 NOTE — Telephone Encounter (Signed)
Left vm for pt to call back, need to know about the discount card we gave her (did she use it?).

## 2017-02-17 NOTE — Telephone Encounter (Signed)
Pt states she will not be getting Vilazodone HCl (VIIBRYD) 40 MG TABS Refilled because of the cost-$273 dollars.  Would like to go back on what she was taking or try something else.  [facility] on [address]

## 2017-02-20 MED ORDER — CITALOPRAM HYDROBROMIDE 40 MG PO TABS: 40.0000 mg | ORAL_TABLET | Freq: Every day | ORAL | 3 refills | Status: DC

## 2017-02-20 NOTE — Telephone Encounter (Signed)
celexa sent

## 2017-02-20 NOTE — Telephone Encounter (Signed)
Left vm inform pt that med sent in.

## 2017-02-22 ENCOUNTER — Encounter: Payer: BLUE CROSS/BLUE SHIELD | Admitting: Nurse Practitioner

## 2017-02-28 ENCOUNTER — Encounter: Payer: Self-pay | Admitting: Nurse Practitioner

## 2017-02-28 ENCOUNTER — Other Ambulatory Visit: Payer: BLUE CROSS/BLUE SHIELD

## 2017-02-28 ENCOUNTER — Ambulatory Visit (INDEPENDENT_AMBULATORY_CARE_PROVIDER_SITE_OTHER): Payer: BLUE CROSS/BLUE SHIELD | Admitting: Nurse Practitioner

## 2017-02-28 ENCOUNTER — Other Ambulatory Visit (INDEPENDENT_AMBULATORY_CARE_PROVIDER_SITE_OTHER): Payer: BLUE CROSS/BLUE SHIELD

## 2017-02-28 VITALS — BP 126/86 | HR 76 | Temp 97.5°F | Wt 187.0 lb

## 2017-02-28 DIAGNOSIS — Z0001 Encounter for general adult medical examination with abnormal findings: Secondary | ICD-10-CM | POA: Diagnosis not present

## 2017-02-28 DIAGNOSIS — Z114 Encounter for screening for human immunodeficiency virus [HIV]: Secondary | ICD-10-CM

## 2017-02-28 DIAGNOSIS — R7309 Other abnormal glucose: Secondary | ICD-10-CM

## 2017-02-28 DIAGNOSIS — N760 Acute vaginitis: Secondary | ICD-10-CM | POA: Diagnosis not present

## 2017-02-28 DIAGNOSIS — I10 Essential (primary) hypertension: Secondary | ICD-10-CM

## 2017-02-28 DIAGNOSIS — Z1159 Encounter for screening for other viral diseases: Secondary | ICD-10-CM

## 2017-02-28 LAB — CBC
HEMATOCRIT: 40.8 % (ref 36.0–46.0)
Hemoglobin: 14 g/dL (ref 12.0–15.0)
MCHC: 34.4 g/dL (ref 30.0–36.0)
MCV: 85.6 fl (ref 78.0–100.0)
Platelets: 219 10*3/uL (ref 150.0–400.0)
RBC: 4.77 Mil/uL (ref 3.87–5.11)
RDW: 13.3 % (ref 11.5–15.5)
WBC: 9.3 10*3/uL (ref 4.0–10.5)

## 2017-02-28 LAB — TSH: TSH: 1.13 u[IU]/mL (ref 0.35–4.50)

## 2017-02-28 LAB — HEMOGLOBIN A1C: Hgb A1c MFr Bld: 5.8 % (ref 4.6–6.5)

## 2017-02-28 MED ORDER — LISINOPRIL-HYDROCHLOROTHIAZIDE 20-25 MG PO TABS: 1.0000 | ORAL_TABLET | Freq: Every day | ORAL | 1 refills | Status: DC

## 2017-02-28 NOTE — Patient Instructions (Signed)
Please go to lab for blood draw. You will be called with results.  Please have mammogram, pap smear and colonoscopy results sent to me.  Health Maintenance, Female Adopting a healthy lifestyle and getting preventive care can go a long way to promote health and wellness. Talk with your health care provider about what schedule of regular examinations is right for you. This is a good chance for you to check in with your provider about disease prevention and staying healthy. In between checkups, there are plenty of things you can do on your own. Experts have done a lot of research about which lifestyle changes and preventive measures are most likely to keep you healthy. Ask your health care provider for more information. Weight and diet Eat a healthy diet  Be sure to include plenty of vegetables, fruits, low-fat dairy products, and lean protein.  Do not eat a lot of foods high in solid fats, added sugars, or salt.  Get regular exercise. This is one of the most important things you can do for your health.  Most adults should exercise for at least 150 minutes each week. The exercise should increase your heart rate and make you sweat (moderate-intensity exercise).  Most adults should also do strengthening exercises at least twice a week. This is in addition to the moderate-intensity exercise. Maintain a healthy weight  Body mass index (BMI) is a measurement that can be used to identify possible weight problems. It estimates body fat based on height and weight. Your health care provider can help determine your BMI and help you achieve or maintain a healthy weight.  For females 61 years of age and older:  A BMI below 18.5 is considered underweight.  A BMI of 18.5 to 24.9 is normal.  A BMI of 25 to 29.9 is considered overweight.  A BMI of 30 and above is considered obese. Watch levels of cholesterol and blood lipids  You should start having your blood tested for lipids and cholesterol at 61  years of age, then have this test every 5 years.  You may need to have your cholesterol levels checked more often if:  Your lipid or cholesterol levels are high.  You are older than 61 years of age.  You are at high risk for heart disease. Cancer screening Lung Cancer  Lung cancer screening is recommended for adults 61-15 years old who are at high risk for lung cancer because of a history of smoking.  A yearly low-dose CT scan of the lungs is recommended for people who:  Currently smoke.  Have quit within the past 15 years.  Have at least a 30-pack-year history of smoking. A pack year is smoking an average of one pack of cigarettes a day for 1 year.  Yearly screening should continue until it has been 15 years since you quit.  Yearly screening should stop if you develop a health problem that would prevent you from having lung cancer treatment. Breast Cancer  Practice breast self-awareness. This means understanding how your breasts normally appear and feel.  It also means doing regular breast self-exams. Let your health care provider know about any changes, no matter how small.  If you are in your 20s or 30s, you should have a clinical breast exam (CBE) by a health care provider every 1-3 years as part of a regular health exam.  If you are 61 or older, have a CBE every year. Also consider having a breast X-ray (mammogram) every year.  If you have  a family history of breast cancer, talk to your health care provider about genetic screening.  If you are at high risk for breast cancer, talk to your health care provider about having an MRI and a mammogram every year.  Breast cancer gene (BRCA) assessment is recommended for women who have family members with BRCA-related cancers. BRCA-related cancers include:  Breast.  Ovarian.  Tubal.  Peritoneal cancers.  Results of the assessment will determine the need for genetic counseling and BRCA1 and BRCA2 testing. Cervical Cancer   Your health care provider may recommend that you be screened regularly for cancer of the pelvic organs (ovaries, uterus, and vagina). This screening involves a pelvic examination, including checking for microscopic changes to the surface of your cervix (Pap test). You may be encouraged to have this screening done every 3 years, beginning at age 61.  For women ages 60-65, health care providers may recommend pelvic exams and Pap testing every 3 years, or they may recommend the Pap and pelvic exam, combined with testing for human papilloma virus (HPV), every 5 years. Some types of HPV increase your risk of cervical cancer. Testing for HPV may also be done on women of any age with unclear Pap test results.  Other health care providers may not recommend any screening for nonpregnant women who are considered low risk for pelvic cancer and who do not have symptoms. Ask your health care provider if a screening pelvic exam is right for you.  If you have had past treatment for cervical cancer or a condition that could lead to cancer, you need Pap tests and screening for cancer for at least 20 years after your treatment. If Pap tests have been discontinued, your risk factors (such as having a new sexual partner) need to be reassessed to determine if screening should resume. Some women have medical problems that increase the chance of getting cervical cancer. In these cases, your health care provider may recommend more frequent screening and Pap tests. Colorectal Cancer  This type of cancer can be detected and often prevented.  Routine colorectal cancer screening usually begins at 61 years of age and continues through 61 years of age.  Your health care provider may recommend screening at an earlier age if you have risk factors for colon cancer.  Your health care provider may also recommend using home test kits to check for hidden blood in the stool.  A small camera at the end of a tube can be used to examine  your colon directly (sigmoidoscopy or colonoscopy). This is done to check for the earliest forms of colorectal cancer.  Routine screening usually begins at age 61.  Direct examination of the colon should be repeated every 5-10 years through 61 years of age. However, you may need to be screened more often if early forms of precancerous polyps or small growths are found. Skin Cancer  Check your skin from head to toe regularly.  Tell your health care provider about any new moles or changes in moles, especially if there is a change in a mole's shape or color.  Also tell your health care provider if you have a mole that is larger than the size of a pencil eraser.  Always use sunscreen. Apply sunscreen liberally and repeatedly throughout the day.  Protect yourself by wearing long sleeves, pants, a wide-brimmed hat, and sunglasses whenever you are outside. Heart disease, diabetes, and high blood pressure  High blood pressure causes heart disease and increases the risk of stroke. High  blood pressure is more likely to develop in:  People who have blood pressure in the high end of the normal range (130-139/85-89 mm Hg).  People who are overweight or obese.  People who are African American.  If you are 43-59 years of age, have your blood pressure checked every 3-5 years. If you are 99 years of age or older, have your blood pressure checked every year. You should have your blood pressure measured twice-once when you are at a hospital or clinic, and once when you are not at a hospital or clinic. Record the average of the two measurements. To check your blood pressure when you are not at a hospital or clinic, you can use:  An automated blood pressure machine at a pharmacy.  A home blood pressure monitor.  If you are between 59 years and 35 years old, ask your health care provider if you should take aspirin to prevent strokes.  Have regular diabetes screenings. This involves taking a blood sample  to check your fasting blood sugar level.  If you are at a normal weight and have a low risk for diabetes, have this test once every three years after 61 years of age.  If you are overweight and have a high risk for diabetes, consider being tested at a younger age or more often. Preventing infection Hepatitis B  If you have a higher risk for hepatitis B, you should be screened for this virus. You are considered at high risk for hepatitis B if:  You were born in a country where hepatitis B is common. Ask your health care provider which countries are considered high risk.  Your parents were born in a high-risk country, and you have not been immunized against hepatitis B (hepatitis B vaccine).  You have HIV or AIDS.  You use needles to inject street drugs.  You live with someone who has hepatitis B.  You have had sex with someone who has hepatitis B.  You get hemodialysis treatment.  You take certain medicines for conditions, including cancer, organ transplantation, and autoimmune conditions. Hepatitis C  Blood testing is recommended for:  Everyone born from [birth year] through 1965.  Anyone with known risk factors for hepatitis C. Sexually transmitted infections (STIs)  You should be screened for sexually transmitted infections (STIs) including gonorrhea and chlamydia if:  You are sexually active and are younger than 61 years of age.  You are older than 61 years of age and your health care provider tells you that you are at risk for this type of infection.  Your sexual activity has changed since you were last screened and you are at an increased risk for chlamydia or gonorrhea. Ask your health care provider if you are at risk.  If you do not have HIV, but are at risk, it may be recommended that you take a prescription medicine daily to prevent HIV infection. This is called pre-exposure prophylaxis (PrEP). You are considered at risk if:  You are sexually active and do not regularly  use condoms or know the HIV status of your partner(s).  You take drugs by injection.  You are sexually active with a partner who has HIV. Talk with your health care provider about whether you are at high risk of being infected with HIV. If you choose to begin PrEP, you should first be tested for HIV. You should then be tested every 3 months for as long as you are taking PrEP. Pregnancy  If you are premenopausal and you  may become pregnant, ask your health care provider about preconception counseling.  If you may become pregnant, take 400 to 800 micrograms (mcg) of folic acid every day.  If you want to prevent pregnancy, talk to your health care provider about birth control (contraception). Osteoporosis and menopause  Osteoporosis is a disease in which the bones lose minerals and strength with aging. This can result in serious bone fractures. Your risk for osteoporosis can be identified using a bone density scan.  If you are 88 years of age or older, or if you are at risk for osteoporosis and fractures, ask your health care provider if you should be screened.  Ask your health care provider whether you should take a calcium or vitamin D supplement to lower your risk for osteoporosis.  Menopause may have certain physical symptoms and risks.  Hormone replacement therapy may reduce some of these symptoms and risks. Talk to your health care provider about whether hormone replacement therapy is right for you. Follow these instructions at home:  Schedule regular health, dental, and eye exams.  Stay current with your immunizations.  Do not use any tobacco products including cigarettes, chewing tobacco, or electronic cigarettes.  If you are pregnant, do not drink alcohol.  If you are breastfeeding, limit how much and how often you drink alcohol.  Limit alcohol intake to no more than 1 drink per day for nonpregnant women. One drink equals 12 ounces of beer, 5 ounces of wine, or 1 ounces of  hard liquor.  Do not use street drugs.  Do not share needles.  Ask your health care provider for help if you need support or information about quitting drugs.  Tell your health care provider if you often feel depressed.  Tell your health care provider if you have ever been abused or do not feel safe at home. This information is not intended to replace advice given to you by your health care provider. Make sure you discuss any questions you have with your health care provider. Document Released: 04/11/2011 Document Revised: 03/03/2016 Document Reviewed: 06/30/2015 Elsevier Interactive Patient Education  2017 [employer].

## 2017-02-28 NOTE — Addendum Note (Signed)
Addended by: Alysia PennaNCHE, Mikaili Flippin L on: 02/28/2017 05:40 PM   Modules accepted: Orders

## 2017-02-28 NOTE — Progress Notes (Signed)
Subjective:  Patient ID: Connie Wilson, female    DOB: 09-07-1956  Age: 61 y.o. MRN: [id number]  CC: Annual Exam (CPE/)   HPI  appt with GYN: 03/08/2017. Pelvic exam and breast exam will be done by GYN. Mammogram will be ordered by GYN per patient's request  Colonoscopy is scheduled 03/01/2017.  Vaginal itching: Onset 2days. No dysuria, no discharge, no rash. No improvement with OTC vaginal cream cream. No douching, not sexually active.  HTN: controlled with lisinopril HCTZ BP Readings from Last 3 Encounters:  02/28/17 126/86  02/14/17 118/74  02/06/17 122/80   Hyperlipidemia: Does not want cholesterol lowering agent at this time. Will make diet changes.  Depression: appt with psychiatry 03/22/2017. Does not want changes to medications till appt with psychiatry. Depression screen [facility] 2/9 01/23/2017 10/19/2016 04/01/2016  Decreased Interest 3 0 0  Down, Depressed, Hopeless 3 0 0  PHQ - 2 Score 6 0 0  Altered sleeping 3 - -  Tired, decreased energy 3 - -  Change in appetite 0 - -  Feeling bad or failure about yourself  3 - -  Trouble concentrating 0 - -  Moving slowly or fidgety/restless 0 - -  Suicidal thoughts 0 - -  PHQ-9 Score 15 - -  Difficult doing work/chores Not difficult at all - -   Outpatient Medications Prior to Visit  Medication Sig Dispense Refill  . Ascorbic Acid (VITAMIN C) 1000 MG tablet Take 1,000 mg by mouth daily.    . calcium citrate (CALCITRATE - DOSED IN MG ELEMENTAL CALCIUM) 950 MG tablet Take 1 tablet by mouth 2 (two) times daily.    . citalopram (CELEXA) 40 MG tablet Take 1 tablet (40 mg total) by mouth daily. 30 tablet 3  . fish oil-omega-3 fatty acids 1000 MG capsule Take 1 g by mouth 2 (two) times daily.     . fluticasone (FLONASE) 50 MCG/ACT nasal spray Place 2 sprays into both nostrils daily. 16 g 0  . Multiple Vitamin (MULTIVITAMIN) tablet Take 1 tablet by mouth daily.    . sodium chloride (OCEAN) 0.65 % SOLN nasal spray Place 1 spray  into both nostrils as needed for congestion. 15 mL 0  . lisinopril-hydrochlorothiazide (PRINZIDE,ZESTORETIC) 20-25 MG tablet TAKE ONE TABLET BY MOUTH ONCE DAILY 30 tablet 0   No facility-administered medications prior to visit.     ROS Review of Systems  Constitutional: Negative for fever, malaise/fatigue and weight loss.  HENT: Negative for congestion and sore throat.   Eyes:       Negative for visual changes  Respiratory: Negative for cough and shortness of breath.   Cardiovascular: Negative for chest pain, palpitations and leg swelling.  Gastrointestinal: Negative for blood in stool, constipation, diarrhea and heartburn.  Genitourinary: Negative for dysuria, frequency and urgency.  Musculoskeletal: Negative for falls, joint pain and myalgias.  Skin: Negative for rash.  Neurological: Negative for dizziness, sensory change and headaches.  Endo/Heme/Allergies: Does not bruise/bleed easily.  Psychiatric/Behavioral: Negative for depression, substance abuse and suicidal ideas. The patient is not nervous/anxious.     Objective:  BP 126/86   Pulse 76   Temp 97.5 F (36.4 C)   Wt 187 lb (84.8 kg)   SpO2 98%   BMI 27.62 kg/m   BP Readings from Last 3 Encounters:  02/28/17 126/86  02/14/17 118/74  02/06/17 122/80    Wt Readings from Last 3 Encounters:  02/28/17 187 lb (84.8 kg)  02/14/17 189 lb (85.7 kg)  02/06/17 189 lb (  85.7 kg)    Physical Exam  Constitutional: She is oriented to person, place, and time. No distress.  HENT:  Right Ear: External ear normal.  Left Ear: External ear normal.  Nose: Nose normal.  Mouth/Throat: No oropharyngeal exudate.  Eyes: No scleral icterus.  Neck: Normal range of motion. Neck supple.  Cardiovascular: Normal rate, regular rhythm and normal heart sounds.   Pulmonary/Chest: Effort normal and breath sounds normal. No respiratory distress.  Breast exam deferred to GYN by patient.  Abdominal: Soft. She exhibits no distension.    Genitourinary: Rectum normal. Pelvic exam was performed with patient supine. There is no rash, tenderness or lesion on the right labia. There is no rash, tenderness or lesion on the left labia. There is erythema in the vagina. No tenderness in the vagina. Vaginal discharge found.  Musculoskeletal: Normal range of motion. She exhibits no edema.  Lymphadenopathy:    She has no cervical adenopathy.  Neurological: She is alert and oriented to person, place, and time. No cranial nerve deficit. Coordination normal.  Skin: Skin is warm and dry.  Psychiatric: She has a normal mood and affect. Her behavior is normal.  Vitals reviewed.   Lab Results  Component Value Date   WBC 10.2 04/01/2016   HGB 13.6 04/01/2016   HCT 39.7 04/01/2016   PLT 308 10/20/2015   GLUCOSE 91 02/06/2017   CHOL 275 (H) 02/06/2017   TRIG 149 02/06/2017   HDL 52 02/06/2017   LDLCALC 193 (H) 02/06/2017   ALT 22 11/28/2015   AST 24 11/28/2015   NA 139 02/06/2017   K 4.1 02/06/2017   CL 97 02/06/2017   CREATININE 0.78 02/06/2017   BUN 11 02/06/2017   CO2 28 02/06/2017   TSH 0.997 09/17/2014   INR 1.29 12/27/2009   HGBA1C 5.9 09/17/2014    No results found.  Assessment & Plan:   Connie Wilson was seen today for annual exam.  Diagnoses and all orders for this visit:  Encounter for preventative adult health care exam with abnormal findings -     CBC; Future -     TSH; Future  Acute vaginitis -     Cervicovaginal ancillary only  Essential hypertension -     lisinopril-hydrochlorothiazide (PRINZIDE,ZESTORETIC) 20-25 MG tablet; Take 1 tablet by mouth daily.  Elevated glucose -     Hemoglobin A1c; Future  Encounter for screening for human immunodeficiency virus (HIV) -     HIV antibody; Future  Encounter for hepatitis C screening test for low risk patient -     Hepatitis C Antibody; Future   I have changed Connie Wilson's lisinopril-hydrochlorothiazide. I am also having her maintain her fish oil-omega-3  fatty acids, multivitamin, vitamin C, calcium citrate, fluticasone, sodium chloride, and citalopram.  Meds ordered this encounter  Medications  . lisinopril-hydrochlorothiazide (PRINZIDE,ZESTORETIC) 20-25 MG tablet    Sig: Take 1 tablet by mouth daily.    Dispense:  90 tablet    Refill:  1    Needs office visit for refills    Order Specific Question:   Supervising Provider    Answer:   Tresa GarterPLOTNIKOV, ALEKSEI V [[id number]]    Follow-up: Return in about 6 months (around 08/31/2017) for HTN, hyperlipidemia (fasting).  Connie Pennaharlotte Braxen Dobek, NP

## 2017-03-01 ENCOUNTER — Encounter: Payer: Self-pay | Admitting: Gastroenterology

## 2017-03-01 ENCOUNTER — Ambulatory Visit (AMBULATORY_SURGERY_CENTER): Payer: BLUE CROSS/BLUE SHIELD | Admitting: Gastroenterology

## 2017-03-01 ENCOUNTER — Other Ambulatory Visit (HOSPITAL_COMMUNITY)
Admission: RE | Admit: 2017-03-01 | Discharge: 2017-03-01 | Disposition: A | Discharge status: Home / Self Care | Payer: BLUE CROSS/BLUE SHIELD | Source: Ambulatory Visit | Attending: Nurse Practitioner | Admitting: Nurse Practitioner

## 2017-03-01 VITALS — BP 110/75 | HR 65 | Temp 98.0°F | Resp 17 | Ht 68.0 in | Wt 191.0 lb

## 2017-03-01 DIAGNOSIS — N760 Acute vaginitis: Secondary | ICD-10-CM | POA: Diagnosis not present

## 2017-03-01 DIAGNOSIS — Z1211 Encounter for screening for malignant neoplasm of colon: Secondary | ICD-10-CM

## 2017-03-01 DIAGNOSIS — Z1212 Encounter for screening for malignant neoplasm of rectum: Secondary | ICD-10-CM

## 2017-03-01 DIAGNOSIS — Z800 Family history of malignant neoplasm of digestive organs: Secondary | ICD-10-CM | POA: Diagnosis not present

## 2017-03-01 LAB — HIV ANTIBODY (ROUTINE TESTING W REFLEX): HIV: NONREACTIVE

## 2017-03-01 LAB — HEPATITIS C ANTIBODY: HCV AB: NEGATIVE

## 2017-03-01 MED ORDER — SODIUM CHLORIDE 0.9 % IV SOLN: 500.0000 mL | INTRAVENOUS | Status: DC

## 2017-03-01 NOTE — Op Note (Signed)
[facility] Patient Name: Connie Wilson Procedure Date: 03/01/2017 8:43 AM MRN: [id number] Endoscopist: Meryl Dare , MD Age: 61 Referring MD:  Date of Birth: 1956-03-24 Gender: Female Account #: [id number] Procedure:                Colonoscopy Indications:              Screening patient at increased risk: Family history                            of 1st-degree relative with colorectal cancer at                            age 23 years (or older) Medicines:                Monitored Anesthesia Care Procedure:                Pre-Anesthesia Assessment:                           - Prior to the procedure, a History and Physical                            was performed, and patient medications and                            allergies were reviewed. The patient's tolerance of                            previous anesthesia was also reviewed. The risks                            and benefits of the procedure and the sedation                            options and risks were discussed with the patient.                            All questions were answered, and informed consent                            was obtained. Prior Anticoagulants: The patient has                            taken no previous anticoagulant or antiplatelet                            agents. ASA Grade Assessment: II - A patient with                            mild systemic disease. After reviewing the risks                            and benefits, the patient was deemed in  satisfactory condition to undergo the procedure.                           After obtaining informed consent, the colonoscope                            was passed under direct vision. Throughout the                            procedure, the patient's blood pressure, pulse, and                            oxygen saturations were monitored continuously. The                            Colonoscope was introduced  through the anus and                            advanced to the the cecum, identified by                            appendiceal orifice and ileocecal valve. The                            ileocecal valve, appendiceal orifice, and rectum                            were photographed. The quality of the bowel                            preparation was excellent. The colonoscopy was                            performed without difficulty. The patient tolerated                            the procedure well. Scope In: 9:00:30 AM Scope Out: 9:13:33 AM Scope Withdrawal Time: 0 hours 8 minutes 43 seconds  Total Procedure Duration: 0 hours 13 minutes 3 seconds  Findings:                 The perianal and digital rectal examinations were                            normal.                           The entire examined colon appeared normal on direct                            and retroflexion views. Complications:            No immediate complications. Estimated blood loss:                            None. Estimated Blood Loss:  Estimated blood loss: none. Impression:               - The entire examined colon is normal on direct and                            retroflexion views.                           - No specimens collected. Recommendation:           - Repeat colonoscopy in 5 years for screening                            purposes.                           - Patient has a contact number available for                            emergencies. The signs and symptoms of potential                            delayed complications were discussed with the                            patient. Return to normal activities tomorrow.                            Written discharge instructions were provided to the                            patient.                           - Resume previous diet.                           - Continue present medications. Meryl DareMalcolm T Dimitrios Balestrieri, MD 03/01/2017 9:15:51 AM This  report has been signed electronically.

## 2017-03-01 NOTE — Patient Instructions (Signed)
YOU HAD AN ENDOSCOPIC PROCEDURE TODAY AT THE [facility]:   Refer to the procedure report that was given to you for any specific questions about what was found during the examination.  If the procedure report does not answer your questions, please call your gastroenterologist to clarify.  If you requested that your care partner not be given the details of your procedure findings, then the procedure report has been included in a sealed envelope for you to review at your convenience later.  YOU SHOULD EXPECT: Some feelings of bloating in the abdomen. Passage of more gas than usual.  Walking can help get rid of the air that was put into your GI tract during the procedure and reduce the bloating. If you had a lower endoscopy (such as a colonoscopy or flexible sigmoidoscopy) you may notice spotting of blood in your stool or on the toilet paper. If you underwent a bowel prep for your procedure, you may not have a normal bowel movement for a few days.  Please Note:  You might notice some irritation and congestion in your nose or some drainage.  This is from the oxygen used during your procedure.  There is no need for concern and it should clear up in a day or so.  SYMPTOMS TO REPORT IMMEDIATELY:   Following lower endoscopy (colonoscopy or flexible sigmoidoscopy):  Excessive amounts of blood in the stool  Significant tenderness or worsening of abdominal pains  Swelling of the abdomen that is new, acute  Fever of 100F or higher   For urgent or emergent issues, a gastroenterologist can be reached at any hour by calling [phone].   DIET:  We do recommend a small meal at first, but then you may proceed to your regular diet.  Drink plenty of fluids but you should avoid alcoholic beverages for 24 hours.  ACTIVITY:  You should plan to take it easy for the rest of today and you should NOT DRIVE or use heavy machinery until tomorrow (because of the sedation medicines used during the test).     FOLLOW UP: Our staff will call the number listed on your records the next business day following your procedure to check on you and address any questions or concerns that you may have regarding the information given to you following your procedure. If we do not reach you, we will leave a message.  However, if you are feeling well and you are not experiencing any problems, there is no need to return our call.  We will assume that you have returned to your regular daily activities without incident.  If any biopsies were taken you will be contacted by phone or by letter within the next 1-3 weeks.  Please call us at [phone] if you have not heard about the biopsies in 3 weeks.  Repeat Colonoscopy screening in 5 years. Resume previous diet Resume current mediations.    SIGNATURES/CONFIDENTIALITY: You and/or your care partner have signed paperwork which will be entered into your electronic medical record.  These signatures attest to the fact that that the information above on your After Visit Summary has been reviewed and is understood.  Full responsibility of the confidentiality of this discharge information lies with you and/or your care-partner.

## 2017-03-01 NOTE — Progress Notes (Signed)
A and O x3. Report to RN. Tolerated MAC anesthesia well.

## 2017-03-02 ENCOUNTER — Telehealth: Payer: Self-pay | Admitting: *Deleted

## 2017-03-02 ENCOUNTER — Ambulatory Visit: Payer: BLUE CROSS/BLUE SHIELD | Admitting: Obstetrics & Gynecology

## 2017-03-02 NOTE — Telephone Encounter (Signed)
Message left

## 2017-03-03 LAB — CERVICOVAGINAL ANCILLARY ONLY: WET PREP (BD AFFIRM): POSITIVE — AB

## 2017-03-03 MED ORDER — CLOTRIMAZOLE 2 % VA CREA: 1.0000 | TOPICAL_CREAM | Freq: Every day | VAGINAL | 0 refills | Status: DC

## 2017-03-08 ENCOUNTER — Encounter: Payer: Self-pay | Admitting: Obstetrics & Gynecology

## 2017-03-08 ENCOUNTER — Ambulatory Visit (INDEPENDENT_AMBULATORY_CARE_PROVIDER_SITE_OTHER): Payer: BLUE CROSS/BLUE SHIELD | Admitting: Obstetrics & Gynecology

## 2017-03-08 ENCOUNTER — Other Ambulatory Visit: Payer: Self-pay | Admitting: Obstetrics & Gynecology

## 2017-03-08 ENCOUNTER — Telehealth: Payer: Self-pay

## 2017-03-08 VITALS — BP 122/74 | Ht 68.0 in | Wt 186.0 lb

## 2017-03-08 DIAGNOSIS — Z1231 Encounter for screening mammogram for malignant neoplasm of breast: Secondary | ICD-10-CM

## 2017-03-08 DIAGNOSIS — Z1151 Encounter for screening for human papillomavirus (HPV): Secondary | ICD-10-CM | POA: Diagnosis not present

## 2017-03-08 DIAGNOSIS — Z01411 Encounter for gynecological examination (general) (routine) with abnormal findings: Secondary | ICD-10-CM | POA: Diagnosis not present

## 2017-03-08 DIAGNOSIS — B372 Candidiasis of skin and nail: Secondary | ICD-10-CM

## 2017-03-08 DIAGNOSIS — B3731 Acute candidiasis of vulva and vagina: Secondary | ICD-10-CM

## 2017-03-08 DIAGNOSIS — Z780 Asymptomatic menopausal state: Secondary | ICD-10-CM | POA: Diagnosis not present

## 2017-03-08 DIAGNOSIS — T192XXA Foreign body in vulva and vagina, initial encounter: Secondary | ICD-10-CM | POA: Diagnosis not present

## 2017-03-08 DIAGNOSIS — B373 Candidiasis of vulva and vagina: Secondary | ICD-10-CM

## 2017-03-08 MED ORDER — FLUCONAZOLE 150 MG PO TABS: 150.0000 mg | ORAL_TABLET | Freq: Every day | ORAL | 3 refills | Status: DC

## 2017-03-08 MED ORDER — NYSTATIN 100000 UNIT/GM EX POWD: Freq: Two times a day (BID) | CUTANEOUS | 2 refills | Status: DC

## 2017-03-08 MED ORDER — FLUCONAZOLE 150 MG PO TABS: 150.0000 mg | ORAL_TABLET | Freq: Once | ORAL | 0 refills | Status: DC

## 2017-03-08 NOTE — Telephone Encounter (Signed)
Pharmacy faxed a note regarding Diflucan 150 Rx.  "This Rx was sent over twice...1 for quantity of #1 tab and one for a quantity of #3 tab.   Also, it also flagged a potential interaction with her Citalopram. May increase risk of QT prolongation. Please check on these issues and let us know".  Please advise.

## 2017-03-08 NOTE — Progress Notes (Signed)
Connie Wilson 10-Feb-1956 [id number]   History:    61 y.o.  G2P2 Divorced.  Abstinent.  25 yo grand-son lives with her.  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  RP:  New patient presenting for annual gyn exam   HPI:  Menopause.  No HRT.  No PMB.  No pelvic pain.  Normal vaginal secretions.  Breasts wnl except for redness and itchiness between and below breasts.  BMs normal currently.  Recent Colono neg 03/06/2017.  Mother died of Ovarian Cancer.  Past medical history,surgical history, family history and social history were all reviewed and documented in the EPIC chart.  Gynecologic History No LMP recorded. Patient is postmenopausal. Contraception: abstinence, post menopausal status and tubal ligation Last Pap: 8 years ago. Results were: normal per patient Last mammogram: Mar 07, 2007. Results were: normal per patient  Obstetric History OB History  Gravida Para Term Preterm AB Living  2 2       2   SAB TAB Ectopic Multiple Live Births               # Outcome Date GA Lbr Len/2nd Weight Sex Delivery Anes PTL Lv  2 Para           1 Para                ROS: A ROS was performed and pertinent positives and negatives are included in the history.  GENERAL: No fevers or chills. HEENT: No change in vision, no earache, sore throat or sinus congestion. NECK: No pain or stiffness. CARDIOVASCULAR: No chest pain or pressure. No palpitations. PULMONARY: No shortness of breath, cough or wheeze. GASTROINTESTINAL: No abdominal pain, nausea, vomiting or diarrhea, melena or bright red blood per rectum. GENITOURINARY: No urinary frequency, urgency, hesitancy or dysuria. MUSCULOSKELETAL: No joint or muscle pain, no back pain, no recent trauma. DERMATOLOGIC: No rash, no itching, no lesions. ENDOCRINE: No polyuria, polydipsia, no heat or cold intolerance. No recent change in weight. HEMATOLOGICAL: No anemia or easy bruising or bleeding. NEUROLOGIC: No headache, seizures, numbness, tingling or weakness. PSYCHIATRIC: No depression, no loss of  interest in normal activity or change in sleep pattern.     Exam:   BP 122/74   Ht 5\' 8"  (1.727 m)   Wt 186 lb (84.4 kg)   BMI 28.28 kg/m   Body mass index is 28.28 kg/m.  General appearance : Well developed well nourished female. No acute distress HEENT: Eyes: no retinal hemorrhage or exudates,  Neck supple, trachea midline, no carotid bruits, no thyroidmegaly Lungs: Clear to auscultation, no rhonchi or wheezes, or rib retractions  Heart: Regular rate and rhythm, no murmurs or gallops Breast:Examined in sitting and supine position were symmetrical in appearance, no palpable masses or tenderness,  no skin retraction, no nipple inversion, no nipple discharge, no skin discoloration, no axillary or supraclavicular lymphadenopathy.  Erythema between and below breasts c/w yeast. Abdomen: no palpable masses or tenderness, no rebound or guarding Extremities: no edema or skin discoloration or tenderness  Pelvic:  Bartholin, Urethra, Skene Glands: Within normal limits             Vagina: No gross lesions.  D/C c/w yeast infection.  Foreign body removed easily (plastic cap).  Cervix: No gross lesions or discharge.  Pap/HPV done.  Uterus  AV, normal size, shape and consistency, non-tender and mobile  Adnexa  Without masses or tenderness  Anus and perineum  normal     Assessment/Plan:  61 y.o. female for annual exam  1.  Encounter for gynecological examination with abnormal finding Normal Gyn exam except for foreign body in vagina with yeast vaginitis.  Pap/HPV done.  Will schedule screening Mammo.  2. Menopause No HRT.  No PMB.  Well tolerated.  Schedule Bone Density.  3. Vaginal foreign object, initial encounter Cap (white plastic) of a cream container.  Removed easily.  Patient doesn't recall how long it's been there for.  Secondary yeast vaginitis.  4. Candidal vaginitis Fluconazole tab prescription sent.  5. Skin yeast infection Below and between breasts.  Antifungal powder  prescribed.  Usage discussed.  6. Mother Ovarian Ca Normal Gyn exam.  Counseling on above issues >50% x 20 minutes.  Genia DelMarie-Lyne Bernyce Brimley MD, 9:43 AM 03/08/2017

## 2017-03-08 NOTE — Patient Instructions (Signed)
1. Encounter for gynecological examination with abnormal finding Normal Gyn exam except for foreign body in vagina with yeast vaginitis.  Pap/HPV done.  Will schedule screening Mammo.  2. Menopause No HRT.  No PMB.  Well tolerated.  Schedule Bone Density.  3. Vaginal foreign object, initial encounter Cap (white plastic) of a cream container.  Removed easily.  Patient doesn't recall how long it's been there for.  Secondary yeast vaginitis.  4. Candidal vaginitis Fluconazole tab prescription sent.  5. Skin yeast infection Below and between breasts.  Antifungal powder prescribed.  Usage discussed.  6. Mother Ovarian Ca Normal Gyn exam.   Connie Wilson, it was a pleasure to meet you today!  We will inform you as soon as your results are available.

## 2017-03-08 NOTE — Addendum Note (Signed)
Addended by: Berna SpareASTILLO, Damary Doland A on: 03/08/2017 02:59 PM   Modules accepted: Orders

## 2017-03-09 ENCOUNTER — Other Ambulatory Visit: Payer: Self-pay | Admitting: Gynecology

## 2017-03-09 DIAGNOSIS — Z780 Asymptomatic menopausal state: Secondary | ICD-10-CM

## 2017-03-09 DIAGNOSIS — Z13820 Encounter for screening for osteoporosis: Secondary | ICD-10-CM

## 2017-03-09 LAB — PAP, TP IMAGING W/ HPV RNA, RFLX HPV TYPE 16,18/45: HPV mRNA, High Risk: NOT DETECTED

## 2017-03-09 NOTE — Telephone Encounter (Signed)
Sent twice because I wanted to send 3 tab and noticed that prescription was for only one.  But given the risk of prolongation of QT, recommend to change prescription to Terazol 3 (generic).  Refill x 1.

## 2017-03-10 ENCOUNTER — Encounter: Payer: Self-pay | Admitting: Nurse Practitioner

## 2017-03-10 MED ORDER — TERCONAZOLE 0.8 % VA CREA: 1.0000 | TOPICAL_CREAM | Freq: Every day | VAGINAL | 1 refills | Status: DC

## 2017-03-10 NOTE — Telephone Encounter (Signed)
Rx sent called pharmacy for d/c diflucan Rx,pt will have Terazol cream

## 2017-03-21 ENCOUNTER — Ambulatory Visit
Admission: RE | Admit: 2017-03-21 | Discharge: 2017-03-21 | Disposition: A | Discharge status: Home / Self Care | Payer: BLUE CROSS/BLUE SHIELD | Source: Ambulatory Visit | Attending: Obstetrics & Gynecology | Admitting: Obstetrics & Gynecology

## 2017-03-21 DIAGNOSIS — Z1231 Encounter for screening mammogram for malignant neoplasm of breast: Secondary | ICD-10-CM

## 2017-03-22 ENCOUNTER — Ambulatory Visit (HOSPITAL_COMMUNITY): Payer: BLUE CROSS/BLUE SHIELD | Admitting: Psychiatry

## 2017-03-23 ENCOUNTER — Ambulatory Visit (INDEPENDENT_AMBULATORY_CARE_PROVIDER_SITE_OTHER): Payer: BLUE CROSS/BLUE SHIELD

## 2017-03-23 DIAGNOSIS — Z780 Asymptomatic menopausal state: Secondary | ICD-10-CM

## 2017-03-23 DIAGNOSIS — Z13820 Encounter for screening for osteoporosis: Secondary | ICD-10-CM | POA: Diagnosis not present

## 2017-03-27 ENCOUNTER — Encounter: Payer: Self-pay | Admitting: Gynecology

## 2017-08-15 ENCOUNTER — Encounter: Payer: Self-pay | Admitting: Nurse Practitioner

## 2017-08-15 ENCOUNTER — Other Ambulatory Visit (INDEPENDENT_AMBULATORY_CARE_PROVIDER_SITE_OTHER): Payer: BLUE CROSS/BLUE SHIELD

## 2017-08-15 ENCOUNTER — Ambulatory Visit: Payer: BLUE CROSS/BLUE SHIELD | Admitting: Nurse Practitioner

## 2017-08-15 VITALS — BP 136/84 | HR 83 | Temp 98.4°F | Ht 68.0 in | Wt 216.0 lb

## 2017-08-15 DIAGNOSIS — J209 Acute bronchitis, unspecified: Secondary | ICD-10-CM

## 2017-08-15 DIAGNOSIS — E785 Hyperlipidemia, unspecified: Secondary | ICD-10-CM

## 2017-08-15 DIAGNOSIS — F339 Major depressive disorder, recurrent, unspecified: Secondary | ICD-10-CM

## 2017-08-15 DIAGNOSIS — I10 Essential (primary) hypertension: Secondary | ICD-10-CM | POA: Diagnosis not present

## 2017-08-15 LAB — BASIC METABOLIC PANEL
BUN: 10 mg/dL (ref 6–23)
CHLORIDE: 103 meq/L (ref 96–112)
CO2: 29 mEq/L (ref 19–32)
CREATININE: 0.77 mg/dL (ref 0.40–1.20)
Calcium: 9.2 mg/dL (ref 8.4–10.5)
GFR: 80.85 mL/min (ref >60.00)
GLUCOSE: 103 mg/dL — AB (ref 70–99)
Potassium: 4.1 mEq/L (ref 3.5–5.1)
Sodium: 139 mEq/L (ref 135–145)

## 2017-08-15 LAB — LIPID PANEL
CHOL/HDL RATIO: 5
CHOLESTEROL: 239 mg/dL — AB (ref 0–200)
HDL: 49.9 mg/dL (ref >39.00)
LDL Cholesterol: 154 mg/dL — ABNORMAL HIGH (ref 0–99)
NonHDL: 188.94
TRIGLYCERIDES: 175 mg/dL — AB (ref 0.0–149.0)
VLDL: 35 mg/dL (ref 0.0–40.0)

## 2017-08-15 MED ORDER — PRAVASTATIN SODIUM 20 MG PO TABS: 20.0000 mg | ORAL_TABLET | Freq: Every day | ORAL | 5 refills | Status: DC

## 2017-08-15 MED ORDER — CITALOPRAM HYDROBROMIDE 40 MG PO TABS
40.0000 mg | ORAL_TABLET | Freq: Every day | ORAL | 1 refills | Status: DC
Start: 2017-08-15 — End: 2018-03-08

## 2017-08-15 MED ORDER — DM-GUAIFENESIN ER 30-600 MG PO TB12: 1.0000 | ORAL_TABLET | Freq: Two times a day (BID) | ORAL | 0 refills | Status: DC | PRN

## 2017-08-15 MED ORDER — HYDROCODONE-HOMATROPINE 5-1.5 MG/5ML PO SYRP: 5.0000 mL | ORAL_SOLUTION | Freq: Three times a day (TID) | ORAL | 0 refills | Status: DC | PRN

## 2017-08-15 MED ORDER — OMEGA-3-ACID ETHYL ESTERS 1 G PO CAPS: 2.0000 g | ORAL_CAPSULE | Freq: Two times a day (BID) | ORAL | 5 refills | Status: DC

## 2017-08-15 MED ORDER — LISINOPRIL-HYDROCHLOROTHIAZIDE 20-25 MG PO TABS: 1.0000 | ORAL_TABLET | Freq: Every day | ORAL | 1 refills | Status: DC

## 2017-08-15 MED ORDER — DOXYCYCLINE HYCLATE 100 MG PO TABS: 100.0000 mg | ORAL_TABLET | Freq: Two times a day (BID) | ORAL | 0 refills | Status: AC

## 2017-08-15 NOTE — Progress Notes (Signed)
Subjective:  Patient ID: Salvatore MarvelHelen Luhmann, female    DOB: 11-02-1955  Age: 61 y.o. MRN: [id number]  CC: Cough (coughing yellow mucus--3 wks) and Medication Refill (lisinopril and citalopram refills)   URI   This is a new problem. The current episode started 1 to 4 weeks ago. The problem has been unchanged. There has been no fever. Associated symptoms include chest pain, congestion, coughing, ear pain, headaches, a plugged ear sensation, rhinorrhea and sinus pain. Pertinent negatives include no diarrhea, dysuria, sneezing, sore throat, swollen glands or wheezing. She has tried nothing for the symptoms.    Depression: Stable with citalopram. Denies any adverse effects.  Hyperlipidemia: No adverse effects with fish oil. No exercise or change in diet.  Outpatient Medications Prior to Visit  Medication Sig Dispense Refill  . Ascorbic Acid (VITAMIN C) 1000 MG tablet Take 1,000 mg by mouth daily.    . calcium citrate (CALCITRATE - DOSED IN MG ELEMENTAL CALCIUM) 950 MG tablet Take 1 tablet by mouth 2 (two) times daily.    . fluticasone (FLONASE) 50 MCG/ACT nasal spray Place 2 sprays into both nostrils daily. 16 g 0  . Multiple Vitamin (MULTIVITAMIN) tablet Take 1 tablet by mouth daily.    . sodium chloride (OCEAN) 0.65 % SOLN nasal spray Place 1 spray into both nostrils as needed for congestion. 15 mL 0  . citalopram (CELEXA) 40 MG tablet Take 1 tablet (40 mg total) by mouth daily. 30 tablet 3  . clotrimazole (CLOTRIMAZOLE 3) 2 % vaginal cream Place 1 Applicatorful vaginally at bedtime. 21 g 0  . fish oil-omega-3 fatty acids 1000 MG capsule Take 1 g by mouth 2 (two) times daily.     Marland Kitchen. lisinopril-hydrochlorothiazide (PRINZIDE,ZESTORETIC) 20-25 MG tablet Take 1 tablet by mouth daily. 90 tablet 1  . nystatin (MYCOSTATIN/NYSTOP) powder Apply topically 2 (two) times daily. 15 g 2  . terconazole (TERAZOL 3) 0.8 % vaginal cream Place 1 applicator vaginally at bedtime. 20 g 1   Facility-Administered  Medications Prior to Visit  Medication Dose Route Frequency Provider Last Rate Last Dose  . 0.9 %  sodium chloride infusion  500 mL Intravenous Continuous Claudette HeadStark, Malcolm T, MD      . 0.9 %  sodium chloride infusion  500 mL Intravenous Continuous Meryl DareStark, Malcolm T, MD        ROS See HPI  Objective:  BP 136/84   Pulse 83   Temp 98.4 F (36.9 C)   Ht 5\' 8"  (1.727 m)   Wt 216 lb (98 kg)   SpO2 99%   BMI 32.84 kg/m   BP Readings from Last 3 Encounters:  08/15/17 136/84  03/08/17 122/74  03/01/17 110/75    Wt Readings from Last 3 Encounters:  08/15/17 216 lb (98 kg)  03/08/17 186 lb (84.4 kg)  03/01/17 191 lb (86.6 kg)    Physical Exam  Constitutional: She is oriented to person, place, and time. No distress.  HENT:  Right Ear: Tympanic membrane, external ear and ear canal normal.  Left Ear: Tympanic membrane, external ear and ear canal normal.  Nose: Mucosal edema and rhinorrhea present. Right sinus exhibits maxillary sinus tenderness. Right sinus exhibits no frontal sinus tenderness. Left sinus exhibits maxillary sinus tenderness. Left sinus exhibits no frontal sinus tenderness.  Mouth/Throat: Uvula is midline. No trismus in the jaw. Posterior oropharyngeal erythema present. No oropharyngeal exudate.  Eyes: No scleral icterus.  Neck: Normal range of motion. Neck supple.  Cardiovascular: Normal rate, regular rhythm and normal  heart sounds.  Pulmonary/Chest: Effort normal and breath sounds normal.  Musculoskeletal: Normal range of motion. She exhibits no edema.  Lymphadenopathy:    She has no cervical adenopathy.  Neurological: She is alert and oriented to person, place, and time.  Skin: Skin is warm and dry. No rash noted.  Vitals reviewed.   Lab Results  Component Value Date   WBC 9.3 02/28/2017   HGB 14.0 02/28/2017   HCT 40.8 02/28/2017   PLT 219.0 02/28/2017   GLUCOSE 103 (H) 08/15/2017   CHOL 239 (H) 08/15/2017   TRIG 175.0 (H) 08/15/2017   HDL 49.90  08/15/2017   LDLCALC 154 (H) 08/15/2017   ALT 22 11/28/2015   AST 24 11/28/2015   NA 139 08/15/2017   K 4.1 08/15/2017   CL 103 08/15/2017   CREATININE 0.77 08/15/2017   BUN 10 08/15/2017   CO2 29 08/15/2017   TSH 1.13 02/28/2017   INR 1.29 12/27/2009   HGBA1C 5.8 02/28/2017    Mm Digital Screening Bilateral  Result Date: 03/21/2017 CLINICAL DATA:  Screening. EXAM: DIGITAL SCREENING BILATERAL MAMMOGRAM WITH CAD COMPARISON:  Previous exam(s). ACR Breast Density Category a: The breast tissue is almost entirely fatty. FINDINGS: There are no findings suspicious for malignancy. Images were processed with CAD. IMPRESSION: No mammographic evidence of malignancy. A result letter of this screening mammogram will be mailed directly to the patient. RECOMMENDATION: Screening mammogram in one year. (Code:SM-B-01Y) BI-RADS CATEGORY  1: Negative. Electronically Signed   By: Bary Richard M.D.   On: 03/21/2017 16:21    Assessment & Plan:   Craig was seen today for cough and medication refill.  Diagnoses and all orders for this visit:  Essential hypertension -     Basic metabolic panel; Future -     lisinopril-hydrochlorothiazide (PRINZIDE,ZESTORETIC) 20-25 MG tablet; Take 1 tablet daily by mouth.  Hyperlipidemia, unspecified hyperlipidemia type -     Lipid panel; Future -     omega-3 acid ethyl esters (LOVAZA) 1 g capsule; Take 2 capsules (2 g total) 2 (two) times daily by mouth. -     pravastatin (PRAVACHOL) 20 MG tablet; Take 1 tablet (20 mg total) daily by mouth.  Acute bronchitis, unspecified organism -     dextromethorphan-guaiFENesin (MUCINEX DM) 30-600 MG 12hr tablet; Take 1 tablet 2 (two) times daily as needed by mouth for cough. -     doxycycline (VIBRA-TABS) 100 MG tablet; Take 1 tablet (100 mg total) 2 (two) times daily for 7 days by mouth. -     HYDROcodone-homatropine (HYCODAN) 5-1.5 MG/5ML syrup; Take 5 mLs every 8 (eight) hours as needed by mouth for cough.  Episode of  recurrent major depressive disorder, unspecified depression episode severity (HCC) -     citalopram (CELEXA) 40 MG tablet; Take 1 tablet (40 mg total) daily by mouth.   I have discontinued Penelopi Amir's fish oil-omega-3 fatty acids, clotrimazole, nystatin, and terconazole. I have also changed her citalopram and lisinopril-hydrochlorothiazide. Additionally, I am having her start on dextromethorphan-guaiFENesin, doxycycline, HYDROcodone-homatropine, omega-3 acid ethyl esters, and pravastatin. Lastly, I am having her maintain her multivitamin, vitamin C, calcium citrate, fluticasone, and sodium chloride. We will continue to administer sodium chloride and sodium chloride.  Meds ordered this encounter  Medications  . dextromethorphan-guaiFENesin (MUCINEX DM) 30-600 MG 12hr tablet    Sig: Take 1 tablet 2 (two) times daily as needed by mouth for cough.    Dispense:  14 tablet    Refill:  0    Order  Specific Question:   Supervising Provider    Answer:   Tresa GarterPLOTNIKOV, ALEKSEI V [[id number]]  . doxycycline (VIBRA-TABS) 100 MG tablet    Sig: Take 1 tablet (100 mg total) 2 (two) times daily for 7 days by mouth.    Dispense:  14 tablet    Refill:  0    Order Specific Question:   Supervising Provider    Answer:   Tresa GarterPLOTNIKOV, ALEKSEI V [[id number]]  . HYDROcodone-homatropine (HYCODAN) 5-1.5 MG/5ML syrup    Sig: Take 5 mLs every 8 (eight) hours as needed by mouth for cough.    Dispense:  60 mL    Refill:  0    Order Specific Question:   Supervising Provider    Answer:   Tresa GarterPLOTNIKOV, ALEKSEI V [[id number]]  . citalopram (CELEXA) 40 MG tablet    Sig: Take 1 tablet (40 mg total) daily by mouth.    Dispense:  90 tablet    Refill:  1    Order Specific Question:   Supervising Provider    Answer:   Tresa GarterPLOTNIKOV, ALEKSEI V [[id number]]  . lisinopril-hydrochlorothiazide (PRINZIDE,ZESTORETIC) 20-25 MG tablet    Sig: Take 1 tablet daily by mouth.    Dispense:  90 tablet    Refill:  1    Needs office visit for refills    Order Specific  Question:   Supervising Provider    Answer:   Tresa GarterPLOTNIKOV, ALEKSEI V [[id number]]  . omega-3 acid ethyl esters (LOVAZA) 1 g capsule    Sig: Take 2 capsules (2 g total) 2 (two) times daily by mouth.    Dispense:  120 capsule    Refill:  5    Order Specific Question:   Supervising Provider    Answer:   Tresa GarterPLOTNIKOV, ALEKSEI V [[id number]]  . pravastatin (PRAVACHOL) 20 MG tablet    Sig: Take 1 tablet (20 mg total) daily by mouth.    Dispense:  30 tablet    Refill:  5    Order Specific Question:   Supervising Provider    Answer:   Tresa GarterPLOTNIKOV, ALEKSEI V [[id number]]    Follow-up: Return in about 6 months (around 02/12/2018) for with Ashleigh, NP-C.  Alysia Pennaharlotte Roxann Vierra, NP

## 2017-08-15 NOTE — Patient Instructions (Addendum)
Please go to basement for blood draw. Will send medication refill after review of lab results.  I encourage weight loss through heart healthy diet and regular exercise.  Encourage adequate oral hydration.  DASH Eating Plan DASH stands for "Dietary Approaches to Stop Hypertension." The DASH eating plan is a healthy eating plan that has been shown to reduce high blood pressure (hypertension). It may also reduce your risk for type 2 diabetes, heart disease, and stroke. The DASH eating plan may also help with weight loss. What are tips for following this plan? General guidelines  Avoid eating more than 2,300 mg (milligrams) of salt (sodium) a day. If you have hypertension, you may need to reduce your sodium intake to 1,500 mg a day.  Limit alcohol intake to no more than 1 drink a day for nonpregnant women and 2 drinks a day for men. One drink equals 12 oz of beer, 5 oz of wine, or 1 oz of hard liquor.  Work with your health care provider to maintain a healthy body weight or to lose weight. Ask what an ideal weight is for you.  Get at least 30 minutes of exercise that causes your heart to beat faster (aerobic exercise) most days of the week. Activities may include walking, swimming, or biking.  Work with your health care provider or diet and nutrition specialist (dietitian) to adjust your eating plan to your individual calorie needs. Reading food labels  Check food labels for the amount of sodium per serving. Choose foods with less than 5 percent of the Daily Value of sodium. Generally, foods with less than 300 mg of sodium per serving fit into this eating plan.  To find whole grains, look for the word "whole" as the first word in the ingredient list. Shopping  Buy products labeled as "low-sodium" or "no salt added."  Buy fresh foods. Avoid canned foods and premade or frozen meals. Cooking  Avoid adding salt when cooking. Use salt-free seasonings or herbs instead of table salt or sea  salt. Check with your health care provider or pharmacist before using salt substitutes.  Do not fry foods. Cook foods using healthy methods such as baking, boiling, grilling, and broiling instead.  Cook with heart-healthy oils, such as olive, canola, soybean, or sunflower oil. Meal planning   Eat a balanced diet that includes: ? 5 or more servings of fruits and vegetables each day. At each meal, try to fill half of your plate with fruits and vegetables. ? Up to 6-8 servings of whole grains each day. ? Less than 6 oz of lean meat, poultry, or fish each day. A 3-oz serving of meat is about the same size as a deck of cards. One egg equals 1 oz. ? 2 servings of low-fat dairy each day. ? A serving of nuts, seeds, or beans 5 times each week. ? Heart-healthy fats. Healthy fats called Omega-3 fatty acids are found in foods such as flaxseeds and coldwater fish, like sardines, salmon, and mackerel.  Limit how much you eat of the following: ? Canned or prepackaged foods. ? Food that is high in trans fat, such as fried foods. ? Food that is high in saturated fat, such as fatty meat. ? Sweets, desserts, sugary drinks, and other foods with added sugar. ? Full-fat dairy products.  Do not salt foods before eating.  Try to eat at least 2 vegetarian meals each week.  Eat more home-cooked food and less restaurant, buffet, and fast food.  When eating at a  restaurant, ask that your food be prepared with less salt or no salt, if possible. What foods are recommended? The items listed may not be a complete list. Talk with your dietitian about what dietary choices are best for you. Grains Whole-grain or whole-wheat bread. Whole-grain or whole-wheat pasta. Brown rice. [place]. Bulgur. Whole-grain and low-sodium cereals. Pita bread. Low-fat, low-sodium crackers. Whole-wheat flour tortillas. Vegetables Fresh or frozen vegetables (raw, steamed, roasted, or grilled). Low-sodium or reduced-sodium tomato  and vegetable juice. Low-sodium or reduced-sodium tomato sauce and tomato paste. Low-sodium or reduced-sodium canned vegetables. Fruits All fresh, dried, or frozen fruit. Canned fruit in natural juice (without added sugar). Meat and other protein foods Skinless chicken or [place]. Ground chicken or [place]. Pork with fat trimmed off. Fish and seafood. Egg whites. Dried beans, peas, or lentils. Unsalted nuts, nut butters, and seeds. Unsalted canned beans. Lean cuts of beef with fat trimmed off. Low-sodium, lean deli meat. Dairy Low-fat (1%) or fat-free (skim) milk. Fat-free, low-fat, or reduced-fat cheeses. Nonfat, low-sodium ricotta or cottage cheese. Low-fat or nonfat yogurt. Low-fat, low-sodium cheese. Fats and oils Soft margarine without trans fats. Vegetable oil. Low-fat, reduced-fat, or light mayonnaise and salad dressings (reduced-sodium). Canola, safflower, olive, soybean, and sunflower oils. Avocado. Seasoning and other foods Herbs. Spices. Seasoning mixes without salt. Unsalted popcorn and pretzels. Fat-free sweets. What foods are not recommended? The items listed may not be a complete list. Talk with your dietitian about what dietary choices are best for you. Grains Baked goods made with fat, such as croissants, muffins, or some breads. Dry pasta or rice meal packs. Vegetables Creamed or fried vegetables. Vegetables in a cheese sauce. Regular canned vegetables (not low-sodium or reduced-sodium). Regular canned tomato sauce and paste (not low-sodium or reduced-sodium). Regular tomato and vegetable juice (not low-sodium or reduced-sodium). Angie Fava. Olives. Fruits Canned fruit in a light or heavy syrup. Fried fruit. Fruit in cream or butter sauce. Meat and other protein foods Fatty cuts of meat. Ribs. Fried meat. Berniece Salines. Sausage. Bologna and other processed lunch meats. Salami. Fatback. Hotdogs. Bratwurst. Salted nuts and seeds. Canned beans with added salt. Canned or smoked fish. Whole eggs  or egg yolks. Chicken or [place] with skin. Dairy Whole or 2% milk, cream, and half-and-half. Whole or full-fat cream cheese. Whole-fat or sweetened yogurt. Full-fat cheese. Nondairy creamers. Whipped toppings. Processed cheese and cheese spreads. Fats and oils Butter. Stick margarine. Lard. Shortening. Ghee. Bacon fat. Tropical oils, such as coconut, palm kernel, or palm oil. Seasoning and other foods Salted popcorn and pretzels. Onion salt, garlic salt, seasoned salt, table salt, and sea salt. Worcestershire sauce. Tartar sauce. Barbecue sauce. Teriyaki sauce. Soy sauce, including reduced-sodium. Steak sauce. Canned and packaged gravies. Fish sauce. Oyster sauce. Cocktail sauce. Horseradish that you find on the shelf. Ketchup. Mustard. Meat flavorings and tenderizers. Bouillon cubes. Hot sauce and Tabasco sauce. Premade or packaged marinades. Premade or packaged taco seasonings. Relishes. Regular salad dressings. Where to find more information:  National Heart, Lung, and [place]: https://wilson-eaton.com/  American Heart Association: www.heart.org Summary  The DASH eating plan is a healthy eating plan that has been shown to reduce high blood pressure (hypertension). It may also reduce your risk for type 2 diabetes, heart disease, and stroke.  With the DASH eating plan, you should limit salt (sodium) intake to 2,300 mg a day. If you have hypertension, you may need to reduce your sodium intake to 1,500 mg a day.  When on the DASH eating plan, aim to eat more  fresh fruits and vegetables, whole grains, lean proteins, low-fat dairy, and heart-healthy fats.  Work with your health care provider or diet and nutrition specialist (dietitian) to adjust your eating plan to your individual calorie needs. This information is not intended to replace advice given to you by your health care provider. Make sure you discuss any questions you have with your health care provider. Document Released: 09/15/2011  Document Revised: 09/19/2016 Document Reviewed: 09/19/2016 Elsevier Interactive Patient Education  2017 [employer]

## 2017-08-16 ENCOUNTER — Telehealth: Payer: Self-pay | Admitting: Nurse Practitioner

## 2017-08-16 NOTE — Telephone Encounter (Signed)
Patient called in requesting recent lab results.  I have given patient Charlottes response.  Patient is concerned about kidney function.  Is requesting a call back in regard.

## 2017-08-17 NOTE — Telephone Encounter (Signed)
Left vm for the pt to call back.  

## 2017-08-17 NOTE — Telephone Encounter (Signed)
Left another vm for the pt to call back.  

## 2018-01-04 ENCOUNTER — Ambulatory Visit: Payer: BLUE CROSS/BLUE SHIELD | Admitting: Family Medicine

## 2018-01-04 ENCOUNTER — Encounter: Payer: Self-pay | Admitting: Family Medicine

## 2018-01-04 VITALS — BP 128/66 | HR 67 | Temp 97.8°F | Ht 68.0 in | Wt 227.0 lb

## 2018-01-04 DIAGNOSIS — M545 Low back pain, unspecified: Secondary | ICD-10-CM

## 2018-01-04 DIAGNOSIS — I10 Essential (primary) hypertension: Secondary | ICD-10-CM

## 2018-01-04 MED ORDER — LISINOPRIL 20 MG PO TABS: 20.0000 mg | ORAL_TABLET | Freq: Every day | ORAL | 3 refills | Status: DC

## 2018-01-04 NOTE — Patient Instructions (Addendum)
Please try the exercises  Please try heat and ice over the area  Please follow up with me in 4 weeks if there is no improvement.  Please stop your current blood pressure medication and start the one I have sent in  Please get your blood pressure check at least once a day and let us know what it is running.

## 2018-01-04 NOTE — Progress Notes (Signed)
Connie Wilson - 62 y.o. adult MRN [id number]  Date of birth: Apr 03, 1956  SUBJECTIVE:  Including CC & ROS.  Chief Complaint  Patient presents with  . Back Pain    Connie Wilson is a 62 y.o. adult that is presenting with back pain. Ongoing for two days. Pain is located on the right lower aspect of her back and radiates down to her gluteus. Pain is mild to severe when sitting. She has not been taking anything for the pain. She works as a Lawyer in [employer]. Denies injury or surgery.   HTN: she reports that she feels dizzy if her diastolic is less than 60. Has not established with a new PCP yet. Has been taking her bp medication.   Review of Systems  Constitutional: Negative for fever.  HENT: Negative for congestion.   Respiratory: Negative for cough.   Cardiovascular: Negative for leg swelling.  Gastrointestinal: Negative for abdominal pain.  Musculoskeletal: Positive for back pain.  Skin: Negative for color change.  Allergic/Immunologic: Negative for immunocompromised state.  Neurological: Negative for weakness.  Hematological: Negative for adenopathy.  Psychiatric/Behavioral: Negative for agitation.    HISTORY: Past Medical, Surgical, Social, and Family History Reviewed & Updated per EMR.   Pertinent Historical Findings include:  Past Medical History:  Diagnosis Date  . Allergy   . Anxiety   . Arthritis   . Cataract    Bilateral eyes, has not needed surgery yet  . Depression   . Hyperlipidemia   . Hypertension     Past Surgical History:  Procedure Laterality Date  . FRACTURE SURGERY    . fx. R ankle    . TUBAL LIGATION      Allergies  Allergen Reactions  . Clindamycin/Lincomycin     Pt report C-diff  . Penicillins Rash    Has patient had a PCN reaction causing immediate rash, facial/tongue/throat swelling, SOB or lightheadedness with hypotension: No (pt  Did have rash) Has patient had a PCN reaction causing severe rash involving mucus membranes or skin  necrosis: No Has patient had a PCN reaction that required hospitalization: No Has patient had a PCN reaction occurring within the last 10 years:No If all of the above answers are "NO", then may proceed with Cephalosporin use.     Family History  Problem Relation Age of Onset  . Ovarian cancer Mother   . Cancer - Ovarian Mother   . Heart failure Father   . Colon cancer Father   . Hypertension Father   . Prostate cancer Father   . Hypertension Sister   . Hyperlipidemia Sister   . Cancer Sister        unknown type- bladder, liver and lungs  . Heart attack Maternal Grandfather   . Diabetes Paternal Grandmother   . Stomach cancer Neg Hx   . Breast cancer Neg Hx      Social History   Socioeconomic History  . Marital status: Divorced    Spouse name: Not on file  . Number of children: 2  . Years of education: Not on file  . Highest education level: Not on file  Occupational History  . Not on file  Social Needs  . Financial resource strain: Not on file  . Food insecurity:    Worry: Not on file    Inability: Not on file  . Transportation needs:    Medical: Not on file    Non-medical: Not on file  Tobacco Use  . Smoking status: Former Smoker  Last attempt to quit: 03/08/1982    Years since quitting: 35.8  . Smokeless tobacco: Never Used  Substance and Sexual Activity  . Alcohol use: No  . Drug use: No  . Sexual activity: Not Currently    Birth control/protection: Post-menopausal  Lifestyle  . Physical activity:    Days per week: Not on file    Minutes per session: Not on file  . Stress: Not on file  Relationships  . Social connections:    Talks on phone: Not on file    Gets together: Not on file    Attends religious service: Not on file    Active member of club or organization: Not on file    Attends meetings of clubs or organizations: Not on file    Relationship status: Not on file  . Intimate partner violence:    Fear of current or ex partner: Not on file      Emotionally abused: Not on file    Physically abused: Not on file    Forced sexual activity: Not on file  Other Topics Concern  . Not on file  Social History Narrative  . Not on file     PHYSICAL EXAM:  VS: BP 128/66 (BP Location: Left Arm, Patient Position: Sitting, Cuff Size: Normal)   Pulse 67   Temp 97.8 F (36.6 C) (Oral)   Ht 5\' 8"  (1.727 m)   Wt 227 lb (103 kg)   SpO2 98%   BMI 34.52 kg/m  Physical Exam Gen: NAD, alert, cooperative with exam, well-appearing ENT: normal lips, normal nasal mucosa,  Eye: normal EOM, normal conjunctiva and lids CV:  no edema, +2 pedal pulses   Resp: no accessory muscle use, non-labored,   Skin: no rashes, no areas of induration  Neuro: normal tone, normal sensation to touch Psych:  normal insight, alert and oriented MSK:  Back:  TTP of the right glute medius and lumbar lower back muscles  Normal IR and ER  Normal strength to resistance with hip flexion.  Normal knee flexion and extension  Negative SLR b/l  Normal gait  Neurovascularly intact      ASSESSMENT & PLAN:   Low back pain Likely muscular in nature. No suggestion of sciatica  - counseled on HEP  - counseled on heat and ice  - if no improvement try PT, imaging   HTN (hypertension) Controlled. She reports she had dizziness if her diastolic is below 60  - stop the combo med  - start just lisinopril .  - given indications to follow up

## 2018-01-04 NOTE — Assessment & Plan Note (Signed)
Controlled. She reports she had dizziness if her diastolic is below 60  - stop the combo med  - start just lisinopril .  - given indications to follow up

## 2018-01-04 NOTE — Assessment & Plan Note (Signed)
Likely muscular in nature. No suggestion of sciatica  - counseled on HEP  - counseled on heat and ice  - if no improvement try PT, imaging

## 2018-02-27 ENCOUNTER — Other Ambulatory Visit: Payer: Self-pay | Admitting: *Deleted

## 2018-02-27 DIAGNOSIS — F339 Major depressive disorder, recurrent, unspecified: Secondary | ICD-10-CM

## 2018-03-06 ENCOUNTER — Ambulatory Visit: Payer: BLUE CROSS/BLUE SHIELD | Admitting: Family

## 2018-03-06 ENCOUNTER — Encounter: Payer: Self-pay | Admitting: Family

## 2018-03-06 ENCOUNTER — Ambulatory Visit: Payer: Self-pay | Admitting: Family

## 2018-03-06 ENCOUNTER — Ambulatory Visit: Payer: BLUE CROSS/BLUE SHIELD

## 2018-03-06 VITALS — BP 154/98 | HR 94 | Temp 98.2°F | Ht 68.0 in | Wt 231.0 lb

## 2018-03-06 DIAGNOSIS — R635 Abnormal weight gain: Secondary | ICD-10-CM

## 2018-03-06 DIAGNOSIS — R7301 Impaired fasting glucose: Secondary | ICD-10-CM

## 2018-03-06 DIAGNOSIS — I10 Essential (primary) hypertension: Secondary | ICD-10-CM

## 2018-03-06 DIAGNOSIS — Z23 Encounter for immunization: Secondary | ICD-10-CM

## 2018-03-06 MED ORDER — LISINOPRIL-HYDROCHLOROTHIAZIDE 20-12.5 MG PO TABS: 1.0000 | ORAL_TABLET | Freq: Every day | ORAL | 0 refills | Status: DC
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
## 2018-03-08 ENCOUNTER — Other Ambulatory Visit: Payer: Self-pay | Admitting: Family

## 2018-03-08 ENCOUNTER — Telehealth: Payer: Self-pay

## 2018-03-08 ENCOUNTER — Ambulatory Visit: Payer: BLUE CROSS/BLUE SHIELD | Admitting: Nurse Practitioner

## 2018-03-08 DIAGNOSIS — F339 Major depressive disorder, recurrent, unspecified: Secondary | ICD-10-CM

## 2018-03-08 LAB — TB SKIN TEST
Induration: 0 mm
TB SKIN TEST: NEGATIVE

## 2018-03-08 MED ORDER — LISINOPRIL-HYDROCHLOROTHIAZIDE 20-12.5 MG PO TABS: 1.0000 | ORAL_TABLET | Freq: Every day | ORAL | 1 refills | Status: DC

## 2018-03-08 MED ORDER — CITALOPRAM HYDROBROMIDE 40 MG PO TABS: 40.0000 mg | ORAL_TABLET | Freq: Every day | ORAL | 1 refills | Status: DC

## 2018-03-08 NOTE — Telephone Encounter (Signed)
Patient came in today for TB reading. She states she now will not have any insurance until Nov (during the next open enrollment). In the meantime she would like to have refills on her Celexa and Lisinopril for 3-4 months to hold her over until she can get back in. She will have to hold off on the labs and sleep study until she gets insurance now and asked that I let you know.  Please advise regarding refills and I will call her to let her know.

## 2018-03-08 NOTE — Telephone Encounter (Signed)
Molli Knock- thank you for the information; will update her refills as requested to last through November;  Blood pressure today on re-check was improved at 140/80;

## 2018-03-09 NOTE — Telephone Encounter (Signed)
Called and left message for patient regarding refills.

## 2018-08-01 ENCOUNTER — Ambulatory Visit: Payer: Self-pay | Admitting: *Deleted

## 2018-08-01 NOTE — Telephone Encounter (Signed)
Pt called stating that her blood pressure was 106/107 at 1030 (value repeated for verification with pt; at 0947 215/101 at 0947 left wrist; she says that her bottom number has been 91-95; the pt also reports intermittent dizziness which has been ongoing since her last office visit; recommendations made per nurse triage protocol to include seeing a physician within 24 hours; she says that she can not come to an appointment until 08/03/18 because she has to work; pt offered and accepted appointment with Ria Clock, [facility], 08/03/18 at 0820; she verbalized understanding.  Reason for Disposition . Systolic BP  >= 180 OR Diastolic >= 110  Answer Assessment - Initial Assessment Questions 1. BLOOD PRESSURE: "What is the blood pressure?" "Did you take at least two measurements 5 minutes apart?"     106/107 at 0930; 215/101 at 0947 2. ONSET: "When did you take your blood pressure?"     08/01/09 at 0930 and 0947 3. HOW: "How did you obtain the blood pressure?" (e.g., visiting nurse, automatic home BP monitor)    Home cuff on left wrist 4. HISTORY: "Do you have a history of high blood pressure?"     yes 5. MEDICATIONS: "Are you taking any medications for blood pressure?" "Have you missed any doses recently?"     no 6. OTHER SYMPTOMS: "Do you have any symptoms?" (e.g., headache, chest pain, blurred vision, difficulty breathing, weakness)     Ongoing intermittent dizziness since office visit in May 2019 7. PREGNANCY: "Is there any chance you are pregnant?" "When was your last menstrual period?"    no  Protocols used: HIGH BLOOD PRESSURE-A-AH

## 2018-08-01 NOTE — Telephone Encounter (Signed)
Contacted pt to instruct her to bring her home blood pressure cuff so that it can be determined if the reading correlate with office readings; she verbalized understand; she would also like to change her appointment to 08/02/18 at 0800; appointment changed to 08/02/18 at 0800 per pt request; will route to office for notification of this upcoming appointment.

## 2018-08-02 ENCOUNTER — Ambulatory Visit (INDEPENDENT_AMBULATORY_CARE_PROVIDER_SITE_OTHER): Payer: Self-pay | Admitting: Family

## 2018-08-02 ENCOUNTER — Encounter: Payer: Self-pay | Admitting: Family

## 2018-08-02 VITALS — BP 130/100 | HR 80 | Temp 97.5°F | Wt 222.0 lb

## 2018-08-02 DIAGNOSIS — H6121 Impacted cerumen, right ear: Secondary | ICD-10-CM

## 2018-08-02 DIAGNOSIS — I10 Essential (primary) hypertension: Secondary | ICD-10-CM

## 2018-08-02 MED ORDER — FLUTICASONE PROPIONATE 50 MCG/ACT NA SUSP: 2.0000 | Freq: Every day | NASAL | 6 refills | Status: DC

## 2018-08-02 MED ORDER — LISINOPRIL-HYDROCHLOROTHIAZIDE 20-12.5 MG PO TABS: 2.0000 | ORAL_TABLET | Freq: Every day | ORAL | 1 refills | Status: DC

## 2018-08-02 NOTE — Progress Notes (Signed)
Connie Wilson is a 62 y.o. adult with the following history as recorded in EpicCare:  Patient Active Problem List   Diagnosis Date Noted  . Low back pain 01/04/2018  . Insomnia 10/19/2016  . Dizziness 10/19/2016  . Recurrent Clostridium difficile diarrhea 11/12/2015  . Elevated glucose 09/29/2012  . HTN (hypertension) 01/06/2012  . Hyperlipidemia 01/06/2012    Current Outpatient Medications  Medication Sig Dispense Refill  . Ascorbic Acid (VITAMIN C) 1000 MG tablet Take 1,000 mg by mouth daily.    . calcium citrate (CALCITRATE - DOSED IN MG ELEMENTAL CALCIUM) 950 MG tablet Take 1 tablet by mouth 2 (two) times daily.    . Ginkgo Biloba Extract (GNP GINGKO BILOBA EXTRACT) 60 MG CAPS Take 1 each by mouth.    Marland Kitchen lisinopril-hydrochlorothiazide (ZESTORETIC) 20-12.5 MG tablet Take 2 tablets by mouth daily. 180 tablet 1  . Multiple Vitamin (MULTIVITAMIN) tablet Take 1 tablet by mouth daily.    . Omega-3 Fatty Acids (FISH OIL) 1000 MG CAPS Take by mouth.    . fluticasone (FLONASE) 50 MCG/ACT nasal spray Place 2 sprays into both nostrils daily. 16 g 6   No current facility-administered medications for this visit.     Allergies: Clindamycin/lincomycin and Penicillins  Past Medical History:  Diagnosis Date  . Allergy   . Anxiety   . Arthritis   . Cataract    Bilateral eyes, has not needed surgery yet  . Depression   . Hyperlipidemia   . Hypertension     Past Surgical History:  Procedure Laterality Date  . FRACTURE SURGERY    . fx. R ankle    . TUBAL LIGATION      Family History  Problem Relation Age of Onset  . Ovarian cancer Mother   . Cancer - Ovarian Mother   . Heart failure Father   . Colon cancer Father   . Hypertension Father   . Prostate cancer Father   . Hypertension Sister   . Hyperlipidemia Sister   . Cancer Sister        unknown type- bladder, liver and lungs  . Heart attack Maternal Grandfather   . Diabetes Paternal Grandmother   . Stomach cancer Neg Hx   .  Breast cancer Neg Hx     Social History   Tobacco Use  . Smoking status: Former Smoker    Last attempt to quit: 03/08/1982    Years since quitting: 36.4  . Smokeless tobacco: Never Used  Substance Use Topics  . Alcohol use: No    Subjective:  Follow-up on hypertension; not well controlled; taking medication as prescribed; Denies any chest pain, shortness of breath, blurred vision or headache. Opted to stop Celexa for her anxiety- felt like side effects were too much; still admits to anxiety but does not want any type of medication. Also feels that her right ear is clogged up; Still prefers not to do any type of labs due to cost concerns;    Objective:  Vitals:   08/02/18 0806  BP: (!) 130/100  Pulse: 80  Temp: (!) 97.5 F (36.4 C)  SpO2: 97%  Weight: 222 lb (100.7 kg)    General: Well developed, well nourished, in no acute distress  Skin : Warm and dry.  Head: Normocephalic and atraumatic  Eyes: Sclera and conjunctiva clear; pupils round and reactive to light; extraocular movements intact  Ears: External normal; canals clear; cerumen impaction right ear Oropharynx: Pink, supple. No suspicious lesions  Neck: Supple without thyromegaly, adenopathy  Lungs: Respirations unlabored; clear to auscultation bilaterally without wheeze, rales, rhonchi  CVS exam: normal rate and regular rhythm.  Neurologic: Alert and oriented; speech intact; face symmetrical; moves all extremities well; CNII-XII intact without focal deficit   Assessment:  1. Essential hypertension   2. Impacted cerumen of right ear     Plan:  1. Increase to Lisinopril HCT 20/12.5 mg 2 po qd; re-check in 1 week; patient defers labs at this time; 2. Attempted ear lavage- unable to successfully complete; she is to use Debrox at home and return next week.   No follow-ups on file.  No orders of the defined types were placed in this encounter.   Requested Prescriptions   Signed Prescriptions Disp Refills  .  lisinopril-hydrochlorothiazide (ZESTORETIC) 20-12.5 MG tablet 180 tablet 1    Sig: Take 2 tablets by mouth daily.  . fluticasone (FLONASE) 50 MCG/ACT nasal spray 16 g 6    Sig: Place 2 sprays into both nostrils daily.

## 2018-08-03 ENCOUNTER — Ambulatory Visit: Payer: Self-pay | Admitting: Family

## 2018-08-06 ENCOUNTER — Ambulatory Visit (INDEPENDENT_AMBULATORY_CARE_PROVIDER_SITE_OTHER): Payer: Self-pay | Admitting: Family

## 2018-08-06 ENCOUNTER — Encounter: Payer: Self-pay | Admitting: Family

## 2018-08-06 VITALS — BP 128/82 | HR 80 | Temp 97.6°F | Ht 68.0 in | Wt 222.1 lb

## 2018-08-06 DIAGNOSIS — I10 Essential (primary) hypertension: Secondary | ICD-10-CM

## 2018-08-06 DIAGNOSIS — H6121 Impacted cerumen, right ear: Secondary | ICD-10-CM

## 2018-08-06 NOTE — Progress Notes (Signed)
Connie Wilson is a 62 y.o. adult with the following history as recorded in EpicCare:  Patient Active Problem List   Diagnosis Date Noted  . Low back pain 01/04/2018  . Insomnia 10/19/2016  . Dizziness 10/19/2016  . Recurrent Clostridium difficile diarrhea 11/12/2015  . Elevated glucose 09/29/2012  . HTN (hypertension) 01/06/2012  . Hyperlipidemia 01/06/2012    Current Outpatient Medications  Medication Sig Dispense Refill  . Ascorbic Acid (VITAMIN C) 1000 MG tablet Take 1,000 mg by mouth daily.    . calcium citrate (CALCITRATE - DOSED IN MG ELEMENTAL CALCIUM) 950 MG tablet Take 1 tablet by mouth 2 (two) times daily.    . fluticasone (FLONASE) 50 MCG/ACT nasal spray Place 2 sprays into both nostrils daily. 16 g 6  . Ginkgo Biloba Extract (GNP GINGKO BILOBA EXTRACT) 60 MG CAPS Take 1 each by mouth.    Marland Kitchen lisinopril-hydrochlorothiazide (ZESTORETIC) 20-12.5 MG tablet Take 2 tablets by mouth daily. 180 tablet 1  . Multiple Vitamin (MULTIVITAMIN) tablet Take 1 tablet by mouth daily.    . Omega-3 Fatty Acids (FISH OIL) 1000 MG CAPS Take by mouth.     No current facility-administered medications for this visit.     Allergies: Clindamycin/lincomycin and Penicillins  Past Medical History:  Diagnosis Date  . Allergy   . Anxiety   . Arthritis   . Cataract    Bilateral eyes, has not needed surgery yet  . Depression   . Hyperlipidemia   . Hypertension     Past Surgical History:  Procedure Laterality Date  . FRACTURE SURGERY    . fx. R ankle    . TUBAL LIGATION      Family History  Problem Relation Age of Onset  . Ovarian cancer Mother   . Cancer - Ovarian Mother   . Heart failure Father   . Colon cancer Father   . Hypertension Father   . Prostate cancer Father   . Hypertension Sister   . Hyperlipidemia Sister   . Cancer Sister        unknown type- bladder, liver and lungs  . Heart attack Maternal Grandfather   . Diabetes Paternal Grandmother   . Stomach cancer Neg Hx   .  Breast cancer Neg Hx     Social History   Tobacco Use  . Smoking status: Former Smoker    Last attempt to quit: 03/08/1982    Years since quitting: 36.4  . Smokeless tobacco: Never Used  Substance Use Topics  . Alcohol use: No    Subjective:  Presents to complete ear lavage- has been using OTC Debrox as requested; Feeling much better with increase in blood pressure medicaiton; Denies any chest pain, shortness of breath, blurred vision or headache.    Objective:  Vitals:   08/06/18 0811  BP: 128/82  Pulse: 80  Temp: 97.6 F (36.4 C)  TempSrc: Oral  SpO2: 96%  Weight: 222 lb 1.3 oz (100.7 kg)  Height: 5\' 8"  (1.727 m)    General: Well developed, well nourished, in no acute distress  Skin : Warm and dry.  Head: Normocephalic and atraumatic  Eyes: Sclera and conjunctiva clear; pupils round and reactive to light; extraocular movements intact  Ears: External normal; after lavage, canals clear; tympanic membranes normal  Oropharynx: Pink, supple. No suspicious lesions  Neck: Supple without thyromegaly, adenopathy  Lungs: Respirations unlabored; clear to auscultation bilaterally without wheeze, rales, rhonchi  Neurologic: Alert and oriented; speech intact; face symmetrical; moves all extremities well; CNII-XII intact  without focal deficit   Assessment:  1. Impacted cerumen of right ear   2. Essential hypertension     Plan:  1. Completed with no difficulty; 2. Improved- now controlled; continue same medicaiton dosage;  Patient is overdue for labs but defers due to cost concerns- will hopefully have insurance in place soon.   No follow-ups on file.  No orders of the defined types were placed in this encounter.   Requested Prescriptions    No prescriptions requested or ordered in this encounter

## 2018-08-28 ENCOUNTER — Ambulatory Visit (INDEPENDENT_AMBULATORY_CARE_PROVIDER_SITE_OTHER): Payer: Self-pay | Admitting: Family

## 2018-08-28 ENCOUNTER — Ambulatory Visit: Payer: Self-pay | Admitting: Family

## 2018-08-28 ENCOUNTER — Encounter: Payer: Self-pay | Admitting: Family

## 2018-08-28 VITALS — BP 148/90 | HR 102 | Temp 98.1°F | Ht 68.0 in | Wt 218.0 lb

## 2018-08-28 DIAGNOSIS — M545 Low back pain, unspecified: Secondary | ICD-10-CM

## 2018-08-28 MED ORDER — METHOCARBAMOL 500 MG PO TABS: 500.0000 mg | ORAL_TABLET | Freq: Three times a day (TID) | ORAL | 0 refills | Status: DC | PRN

## 2018-08-28 MED ORDER — MELOXICAM 15 MG PO TABS: 15.0000 mg | ORAL_TABLET | Freq: Every day | ORAL | 0 refills | Status: DC

## 2018-08-28 NOTE — Progress Notes (Signed)
Connie Wilson is a 62 y.o. adult with the following history as recorded in EpicCare:  Patient Active Problem List   Diagnosis Date Noted  . Low back pain 01/04/2018  . Insomnia 10/19/2016  . Dizziness 10/19/2016  . Recurrent Clostridium difficile diarrhea 11/12/2015  . Elevated glucose 09/29/2012  . HTN (hypertension) 01/06/2012  . Hyperlipidemia 01/06/2012    Current Outpatient Medications  Medication Sig Dispense Refill  . Ascorbic Acid (VITAMIN C) 1000 MG tablet Take 1,000 mg by mouth daily.    . calcium citrate (CALCITRATE - DOSED IN MG ELEMENTAL CALCIUM) 950 MG tablet Take 1 tablet by mouth 2 (two) times daily.    . fluticasone (FLONASE) 50 MCG/ACT nasal spray Place 2 sprays into both nostrils daily. 16 g 6  . Ginkgo Biloba Extract (GNP GINGKO BILOBA EXTRACT) 60 MG CAPS Take 1 each by mouth.    Marland Kitchen lisinopril-hydrochlorothiazide (ZESTORETIC) 20-12.5 MG tablet Take 2 tablets by mouth daily. 180 tablet 1  . Multiple Vitamin (MULTIVITAMIN) tablet Take 1 tablet by mouth daily.    . Omega-3 Fatty Acids (FISH OIL) 1000 MG CAPS Take by mouth.    . meloxicam (MOBIC) 15 MG tablet Take 1 tablet (15 mg total) by mouth daily. 30 tablet 0  . methocarbamol (ROBAXIN) 500 MG tablet Take 1 tablet (500 mg total) by mouth every 8 (eight) hours as needed. 30 tablet 0   No current facility-administered medications for this visit.     Allergies: Clindamycin/lincomycin and Penicillins  Past Medical History:  Diagnosis Date  . Allergy   . Anxiety   . Arthritis   . Cataract    Bilateral eyes, has not needed surgery yet  . Depression   . Hyperlipidemia   . Hypertension     Past Surgical History:  Procedure Laterality Date  . FRACTURE SURGERY    . fx. R ankle    . TUBAL LIGATION      Family History  Problem Relation Age of Onset  . Ovarian cancer Mother   . Cancer - Ovarian Mother   . Heart failure Father   . Colon cancer Father   . Hypertension Father   . Prostate cancer Father   .  Hypertension Sister   . Hyperlipidemia Sister   . Cancer Sister        unknown type- bladder, liver and lungs  . Heart attack Maternal Grandfather   . Diabetes Paternal Grandmother   . Stomach cancer Neg Hx   . Breast cancer Neg Hx     Social History   Tobacco Use  . Smoking status: Former Smoker    Last attempt to quit: 03/08/1982    Years since quitting: 36.4  . Smokeless tobacco: Never Used  Substance Use Topics  . 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  Head: Normocephalic and atraumatic  Lungs: Respirations unlabored; cMusculoskeletal: No deformities; no active joint inflammation  Extremities: No edema, cyanosis, clubbing  Vessels: Symmetric bilaterally  Neurologic: Alert and oriented; speech intact; face symmetrical; moves all extremities well; CNII-XII intact without focal deficit   Assessment:  1. 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  Once her insurance is in place next year, she agrees to follow-up for CPE/ labs.   No follow-ups on file.  No orders of the defined types were placed in this  encounter.   Requested Prescriptions   Signed Prescriptions Disp Refills  . methocarbamol (ROBAXIN) 500 MG tablet 30 tablet 0    Sig: Take 1 tablet (500 mg total) by mouth every 8 (eight) hours as needed.  . meloxicam (MOBIC) 15 MG tablet 30 tablet 0    Sig: Take 1 tablet (15 mg total) by mouth daily.

## 2018-10-30 ENCOUNTER — Other Ambulatory Visit: Payer: Self-pay | Admitting: Family

## 2018-10-30 ENCOUNTER — Telehealth: Payer: Self-pay

## 2018-10-30 DIAGNOSIS — F339 Major depressive disorder, recurrent, unspecified: Secondary | ICD-10-CM

## 2018-10-30 MED ORDER — CITALOPRAM HYDROBROMIDE 20 MG PO TABS: 20.0000 mg | ORAL_TABLET | Freq: Every day | ORAL | 0 refills | Status: DC

## 2018-10-30 NOTE — Telephone Encounter (Signed)
Copied from CRM [id number]. Topic: General - Other >> Oct 30, 2018 12:28 PM Gaynelle Adu wrote: Reason for CRM: Patient is  requesting a call back from Ria Clock in regards to  her anxiety medication. She would like to be placed back on it. She stated she doesn't want her mood to affect her work. Please advise

## 2018-10-30 NOTE — Telephone Encounter (Signed)
Called and left message for patient today with info. 

## 2018-10-30 NOTE — Telephone Encounter (Signed)
Starting her back at 20 mg; can't just start back on 40 mg; if she feels 20 mg is not enough, she can increase dosage by doubling up after about 2 weeks.

## 2019-01-15 ENCOUNTER — Other Ambulatory Visit: Payer: Self-pay | Admitting: Family

## 2019-01-15 DIAGNOSIS — F339 Major depressive disorder, recurrent, unspecified: Secondary | ICD-10-CM

## 2019-01-15 MED ORDER — CITALOPRAM HYDROBROMIDE 20 MG PO TABS: 20.0000 mg | ORAL_TABLET | Freq: Every day | ORAL | 0 refills | Status: DC

## 2019-01-15 NOTE — Telephone Encounter (Signed)
Requested Prescriptions  Pending Prescriptions Disp Refills  . citalopram (CELEXA) 20 MG tablet 90 tablet 0    Sig: Take 1 tablet (20 mg total) by mouth daily.     Psychiatry:  Antidepressants - SSRI Passed - 01/15/2019  2:15 PM      Passed - Valid encounter within last 6 months    Recent Outpatient Visits          4 months ago Acute bilateral low back pain, unspecified whether sciatica present   [facility] -Shanna Cisco, Allyne Gee, FNP   5 months ago Impacted cerumen of right ear   [facility] -Shanna Cisco, Allyne Gee, FNP   5 months ago Essential hypertension   [facility] -Shanna Cisco, Allyne Gee, FNP   10 months ago Essential hypertension   [facility] -Shanna Cisco, Allyne Gee, FNP   1 year ago Acute right-sided low back pain without sciatica   [facility] -Celine Mans, Marye Round, MD

## 2019-02-19 ENCOUNTER — Other Ambulatory Visit: Payer: Self-pay | Admitting: Family

## 2019-02-19 ENCOUNTER — Telehealth: Payer: Self-pay | Admitting: Family

## 2019-02-19 NOTE — Telephone Encounter (Signed)
Can you ask Connie Wilson to call her and get the insurance information updated so we have for next appt?

## 2019-02-19 NOTE — Telephone Encounter (Signed)
Left patient vm to call back with her insurance information.

## 2019-02-19 NOTE — Telephone Encounter (Signed)
Can you call and check on her? Overdue for office visit/ labs; she was supposed to follow-up when she got insurance in place. Can she at least do a virtual so we can follow-up on blood pressure and Celexa?

## 2019-02-19 NOTE — Telephone Encounter (Signed)
Patient called back. Unable to give all information over the phone. She will try to fax the card to the office.

## 2019-02-25 ENCOUNTER — Ambulatory Visit: Payer: Self-pay | Admitting: Family

## 2019-03-05 ENCOUNTER — Other Ambulatory Visit: Payer: PRIVATE HEALTH INSURANCE

## 2019-03-05 ENCOUNTER — Encounter: Payer: Self-pay | Admitting: Family

## 2019-03-05 ENCOUNTER — Ambulatory Visit (INDEPENDENT_AMBULATORY_CARE_PROVIDER_SITE_OTHER): Payer: PRIVATE HEALTH INSURANCE | Admitting: Family

## 2019-03-05 ENCOUNTER — Other Ambulatory Visit: Payer: Self-pay

## 2019-03-05 ENCOUNTER — Other Ambulatory Visit (INDEPENDENT_AMBULATORY_CARE_PROVIDER_SITE_OTHER): Payer: PRIVATE HEALTH INSURANCE

## 2019-03-05 VITALS — BP 130/88 | HR 79 | Temp 98.2°F | Ht 68.0 in | Wt 202.0 lb

## 2019-03-05 DIAGNOSIS — R197 Diarrhea, unspecified: Secondary | ICD-10-CM

## 2019-03-05 DIAGNOSIS — I10 Essential (primary) hypertension: Secondary | ICD-10-CM

## 2019-03-05 DIAGNOSIS — F339 Major depressive disorder, recurrent, unspecified: Secondary | ICD-10-CM | POA: Diagnosis not present

## 2019-03-05 DIAGNOSIS — Z13220 Encounter for screening for lipoid disorders: Secondary | ICD-10-CM

## 2019-03-05 DIAGNOSIS — R5383 Other fatigue: Secondary | ICD-10-CM | POA: Diagnosis not present

## 2019-03-05 DIAGNOSIS — R200 Anesthesia of skin: Secondary | ICD-10-CM

## 2019-03-05 DIAGNOSIS — R634 Abnormal weight loss: Secondary | ICD-10-CM

## 2019-03-05 DIAGNOSIS — R202 Paresthesia of skin: Secondary | ICD-10-CM

## 2019-03-05 LAB — AMYLASE: Amylase: 46 U/L (ref 27–131)

## 2019-03-05 LAB — LIPASE: Lipase: 49 U/L (ref 11.0–59.0)

## 2019-03-05 LAB — COMPREHENSIVE METABOLIC PANEL
ALT: 14 U/L (ref 0–35)
AST: 17 U/L (ref 0–37)
Albumin: 4 g/dL (ref 3.5–5.2)
Alkaline Phosphatase: 82 U/L (ref 39–117)
BUN: 14 mg/dL (ref 6–23)
CO2: 30 mEq/L (ref 19–32)
Calcium: 9.3 mg/dL (ref 8.4–10.5)
Chloride: 98 mEq/L (ref 96–112)
Creatinine, Ser: 0.79 mg/dL (ref 0.40–1.20)
GFR: 73.48 mL/min (ref >60.00)
Glucose, Bld: 110 mg/dL — ABNORMAL HIGH (ref 70–99)
Potassium: 3.6 mEq/L (ref 3.5–5.1)
Sodium: 137 mEq/L (ref 135–145)
Total Bilirubin: 0.7 mg/dL (ref 0.2–1.2)
Total Protein: 7.1 g/dL (ref 6.0–8.3)

## 2019-03-05 LAB — CBC WITH DIFFERENTIAL/PLATELET
Basophils Absolute: 0.1 10*3/uL (ref 0.0–0.1)
Basophils Relative: 0.9 % (ref 0.0–3.0)
Eosinophils Absolute: 0.1 10*3/uL (ref 0.0–0.7)
Eosinophils Relative: 0.8 % (ref 0.0–5.0)
HCT: 39.4 % (ref 36.0–46.0)
Hemoglobin: 14 g/dL (ref 12.0–15.0)
Lymphocytes Relative: 23.5 % (ref 12.0–46.0)
Lymphs Abs: 2 10*3/uL (ref 0.7–4.0)
MCHC: 35.6 g/dL (ref 30.0–36.0)
MCV: 84.8 fl (ref 78.0–100.0)
Monocytes Absolute: 0.6 10*3/uL (ref 0.1–1.0)
Monocytes Relative: 6.5 % (ref 3.0–12.0)
Neutro Abs: 5.8 10*3/uL (ref 1.4–7.7)
Neutrophils Relative %: 68.3 % (ref 43.0–77.0)
Platelets: 239 10*3/uL (ref 150.0–400.0)
RBC: 4.65 Mil/uL (ref 3.87–5.11)
RDW: 13.2 % (ref 11.5–15.5)
WBC: 8.5 10*3/uL (ref 4.0–10.5)

## 2019-03-05 LAB — LIPID PANEL
Cholesterol: 258 mg/dL — ABNORMAL HIGH (ref 0–200)
HDL: 38.8 mg/dL — ABNORMAL LOW (ref >39.00)
LDL Cholesterol: 181 mg/dL — ABNORMAL HIGH (ref 0–99)
NonHDL: 219.05
Total CHOL/HDL Ratio: 7
Triglycerides: 190 mg/dL — ABNORMAL HIGH (ref 0.0–149.0)
VLDL: 38 mg/dL (ref 0.0–40.0)

## 2019-03-05 LAB — VITAMIN B12: Vitamin B-12: 463 pg/mL (ref 211–911)

## 2019-03-05 LAB — VITAMIN D 25 HYDROXY (VIT D DEFICIENCY, FRACTURES): VITD: 23.33 ng/mL — ABNORMAL LOW (ref 30.00–100.00)

## 2019-03-05 LAB — TSH: TSH: 1.92 u[IU]/mL (ref 0.35–4.50)

## 2019-03-05 MED ORDER — CITALOPRAM HYDROBROMIDE 20 MG PO TABS: 20.0000 mg | ORAL_TABLET | Freq: Every day | ORAL | 3 refills | Status: DC

## 2019-03-05 MED ORDER — LISINOPRIL-HYDROCHLOROTHIAZIDE 20-12.5 MG PO TABS: 2.0000 | ORAL_TABLET | Freq: Every day | ORAL | 3 refills | Status: DC

## 2019-03-05 NOTE — Progress Notes (Signed)
Connie Wilson is a 63 y.o. adult with the following history as recorded in EpicCare:  Patient Active Problem List   Diagnosis Date Noted  . Low back pain 01/04/2018  . Insomnia 10/19/2016  . Dizziness 10/19/2016  . Recurrent Clostridium difficile diarrhea 11/12/2015  . Elevated glucose 09/29/2012  . HTN (hypertension) 01/06/2012  . Hyperlipidemia 01/06/2012    Current Outpatient Medications  Medication Sig Dispense Refill  . Ascorbic Acid (VITAMIN C) 1000 MG tablet Take 1,000 mg by mouth daily.    . calcium citrate (CALCITRATE - DOSED IN MG ELEMENTAL CALCIUM) 950 MG tablet Take 1 tablet by mouth 2 (two) times daily.    . citalopram (CELEXA) 20 MG tablet Take 1 tablet (20 mg total) by mouth daily. 90 tablet 3  . fluticasone (FLONASE) 50 MCG/ACT nasal spray Place 2 sprays into both nostrils daily. 16 g 6  . Ginkgo Biloba Extract (GNP GINGKO BILOBA EXTRACT) 60 MG CAPS Take 1 each by mouth.    Marland Kitchen lisinopril-hydrochlorothiazide (ZESTORETIC) 20-12.5 MG tablet Take 2 tablets by mouth daily. 180 tablet 3  . meloxicam (MOBIC) 15 MG tablet Take 1 tablet (15 mg total) by mouth daily. 30 tablet 0  . methocarbamol (ROBAXIN) 500 MG tablet Take 1 tablet (500 mg total) by mouth every 8 (eight) hours as needed. 30 tablet 0  . Multiple Vitamin (MULTIVITAMIN) tablet Take 1 tablet by mouth daily.    . Omega-3 Fatty Acids (FISH OIL) 1000 MG CAPS Take by mouth.     No current facility-administered medications for this visit.     Allergies: Clindamycin/lincomycin and Penicillins  Past Medical History:  Diagnosis Date  . Allergy   . Anxiety   . Arthritis   . Cataract    Bilateral eyes, has not needed surgery yet  . Depression   . Hyperlipidemia   . Hypertension     Past Surgical History:  Procedure Laterality Date  . FRACTURE SURGERY    . fx. R ankle    . TUBAL LIGATION      Family History  Problem Relation Age of Onset  . Ovarian cancer Mother   . Cancer - Ovarian Mother   . Heart failure  Father   . Colon cancer Father   . Hypertension Father   . Prostate cancer Father   . Hypertension Sister   . Hyperlipidemia Sister   . Cancer Sister        unknown type- bladder, liver and lungs  . Heart attack Maternal Grandfather   . Diabetes Paternal Grandmother   . Stomach cancer Neg Hx   . Breast cancer Neg Hx     Social History   Tobacco Use  . Smoking status: Former Smoker    Last attempt to quit: 03/08/1982    Years since quitting: 37.0  . Smokeless tobacco: Never Used  Substance Use Topics  . Alcohol use: No    Subjective:  6 month follow-up on hypertension; needs to get labs updated today;  Also complaining of 2-3 week history of changes in bowel movements- having increased problems with constipation and diarrhea; has lost 16 pounds since last office visit- per patient, feels like this is "unexplained." Noticed in the past few weeks that her pants were getting very loose. Has had some nausea; Colonoscopy done in 2018- due in 2023; admits that increased burping; feels like stress level has been reasonably managed; feeling "full and bloated"/ mildly tender over lower abdomen;     Objective:  Vitals:   03/05/19 7371  BP: 130/88  Pulse: 79  Temp: 98.2 F (36.8 C)  TempSrc: Oral  SpO2: 98%  Weight: 202 lb (91.6 kg)  Height: _0  (1.727 m)    General: Well developed, well nourished, in no acute distress  Skin : Warm and dry.  Head: Normocephalic and atraumatic  Lungs: Respirations unlabored; clear to auscultation bilaterally without wheeze, rales, rhonchi  CVS exam: normal rate and regular rhythm.  Abdomen: Soft; nontender; nondistended; normoactive bowel sounds; no masses or hepatosplenomegaly  Musculoskeletal: No deformities; no active joint inflammation  Extremities: No edema, cyanosis, clubbing  Vessels: Symmetric bilaterally  Neurologic: Alert and oriented; speech intact; face symmetrical; moves all extremities well; CNII-XII intact without focal deficit    Assessment:  1. Essential hypertension   2. Episode of recurrent major depressive disorder, unspecified depression episode severity ([place])   3. Lipid screening   4. Other fatigue   5. Diarrhea, unspecified type   6. Numbness and tingling of both upper extremities   7. Abnormal weight loss     Plan:  1. Stable; refill updated; 2. Stable; refill updated; Update labs today and update abdominal/ pelvic CT; follow-up to be determined- may need to refer back to GI.    No follow-ups on file.  Orders Placed This Encounter  Procedures  . CT Abdomen Pelvis W Contrast    Standing Status:   Future    Standing Expiration Date:   06/04/2020    Order Specific Question:   ** REASON FOR EXAM (FREE TEXT)    Answer:   changes in bowel movements    Order Specific Question:   If indicated for the ordered procedure, I authorize the administration of contrast media per Radiology protocol    Answer:   Yes    Order Specific Question:   Preferred imaging location?    Answer:   [id number]    Order Specific Question:   Is Oral Contrast requested for this exam?    Answer:   Yes, Per Radiology protocol    Order Specific Question:   Radiology Contrast Protocol - do NOT remove file path    Answer:   [email or web address]  . CBC w/Diff    Standing Status:   Future    Standing Expiration Date:   03/04/2020  . Comp Met (CMET)    Standing Status:   Future    Standing Expiration Date:   03/04/2020  . Lipid panel    Standing Status:   Future    Standing Expiration Date:   03/04/2020  . TSH    Standing Status:   Future    Standing Expiration Date:   03/04/2020  . B12    Standing Status:   Future    Standing Expiration Date:   03/04/2020  . Amylase    Standing Status:   Future    Standing Expiration Date:   03/04/2020  . Lipase    Standing Status:   Future    Standing Expiration Date:   03/04/2020  . Vitamin D (25 hydroxy)    Standing Status:   Future    Standing Expiration  Date:   03/04/2020    Requested Prescriptions   Signed Prescriptions Disp Refills  . citalopram (CELEXA) 20 MG tablet 90 tablet 3    Sig: Take 1 tablet (20 mg total) by mouth daily.  Marland Kitchen lisinopril-hydrochlorothiazide (ZESTORETIC) 20-12.5 MG tablet 180 tablet 3    Sig: Take 2 tablets by mouth daily.

## 2019-03-06 ENCOUNTER — Other Ambulatory Visit: Payer: Self-pay | Admitting: Family

## 2019-03-06 DIAGNOSIS — E785 Hyperlipidemia, unspecified: Secondary | ICD-10-CM

## 2019-03-06 MED ORDER — ATORVASTATIN CALCIUM 20 MG PO TABS: 20.0000 mg | ORAL_TABLET | Freq: Every day | ORAL | 1 refills | Status: DC

## 2019-03-19 ENCOUNTER — Telehealth: Payer: Self-pay | Admitting: Family

## 2019-03-19 DIAGNOSIS — R1084 Generalized abdominal pain: Secondary | ICD-10-CM

## 2019-03-19 DIAGNOSIS — R634 Abnormal weight loss: Secondary | ICD-10-CM

## 2019-03-19 NOTE — Telephone Encounter (Signed)
Copied from [place] [id number]. Topic: Quick Communication - See Telephone Encounter >> Mar 19, 2019  2:42 PM Sheran Luz wrote: CRM for notification. See Telephone encounter for: 03/19/19.  Patient calling to make PCP aware that she has cancelled CT scan on 6/10 due to insurance.

## 2019-03-20 ENCOUNTER — Inpatient Hospital Stay: Admission: RE | Admit: 2019-03-20 | Payer: PRIVATE HEALTH INSURANCE | Source: Ambulatory Visit

## 2019-03-20 NOTE — Telephone Encounter (Signed)
Was it the cost of the CT specifically? I am really concerned about her symptoms and weight loss. I don't want to not get this checked out for her. Would she be able to do an [place]? It would be somewhere we could start and would be cheaper.

## 2019-03-20 NOTE — Telephone Encounter (Signed)
Called and left message for patient to return call to clinic to let me know cost of CT and if she would be okay with doing ultrasound and then let us know if it would be too much still.

## 2019-03-26 NOTE — Telephone Encounter (Signed)
Spoke with patient today finally. She said she was told by [facility] that her insurance would not cover her CT? In terms of the ultrasound if you want to order it and have them call her she said she would do it if she could afford it. Outside of [facility] wasn't sure if she would be able to go to [facility] for a cheaper CT scan? Or if we even use them since they are not a part of [facility]?

## 2019-03-27 NOTE — Telephone Encounter (Signed)
We will try ordering the pelvic and abdominal ultrasound.

## 2019-03-27 NOTE — Addendum Note (Signed)
Addended by: Sherlene Shams on: 03/27/2019 09:41 AM   Modules accepted: Orders

## 2019-04-01 ENCOUNTER — Telehealth: Payer: Self-pay

## 2019-04-01 ENCOUNTER — Other Ambulatory Visit: Payer: Self-pay | Admitting: Family

## 2019-04-01 DIAGNOSIS — R1084 Generalized abdominal pain: Secondary | ICD-10-CM

## 2019-04-01 NOTE — Telephone Encounter (Signed)
Spoke with patient today and info given. 

## 2019-04-01 NOTE — Telephone Encounter (Signed)
I would prefer to keep at [facility]- it's easier for me to get the results since they are on the same system with [place]. They are in network also so costs shouldn't be different with [facility].

## 2019-04-01 NOTE — Telephone Encounter (Signed)
Copied from [place] [id number]. Topic: Referral - Question >> Apr 01, 2019 12:44 PM Nils Flack wrote: Reason for CRM:  [facility] - [phone] [place] - [phone] [facility] - [phone] [facility] radiology - [phone] [facility] - [phone] [facility] - [phone] These are in network for pt.

## 2019-04-01 NOTE — Addendum Note (Signed)
Addended by: Sherlene Shams on: 04/01/2019 01:04 PM   Modules accepted: Orders

## 2019-04-01 NOTE — Telephone Encounter (Signed)
Yes, MRI is typically more expensive than CT; I will order the test and we will see what her insurance will cover though.

## 2019-04-01 NOTE — Telephone Encounter (Signed)
Patient calling and would like to know if Connie Wilson could order an MRI vs CT Scan? If this is a possibility, would like to know where she would be sent so she can call her insurance and see if they will approve the MRI. Please advise.  CB#: [phone]

## 2019-04-01 NOTE — Telephone Encounter (Signed)
We will change it to [facility].

## 2019-04-01 NOTE — Telephone Encounter (Signed)
Spoke with patient today and info given that location would be changed to [facility] per her request.

## 2019-04-03 ENCOUNTER — Encounter: Payer: Self-pay | Admitting: Family

## 2019-04-03 ENCOUNTER — Telehealth: Payer: Self-pay

## 2019-04-03 NOTE — Telephone Encounter (Signed)
I don't know what else to tell her- the order clearly says she wants to go to [place]. Can you let her know we will talk to our referral specialists about this? They will have to help her with this.

## 2019-04-03 NOTE — Telephone Encounter (Signed)
Copied from [place] [id number]. Topic: General - Inquiry >> Apr 03, 2019  9:04 AM Richardo Priest, NT wrote: Reason for CRM: Patient calling in stating that the order for her MRI was sent to the wrong place again. Stated that [facility] is out of network with her insurance and she is wanting it sent to [facility]. Please advise and call back is [phone].

## 2019-04-08 ENCOUNTER — Telehealth: Payer: Self-pay

## 2019-04-08 NOTE — Telephone Encounter (Signed)
Left message for patient to call me back and let me know which test she wanted to do. She was suppose to check with her insurance to see between the 2 test which one was covered and which one was cheaper for her to do and let us know.

## 2019-04-22 ENCOUNTER — Encounter: Payer: Self-pay | Admitting: Family

## 2019-04-22 NOTE — Telephone Encounter (Signed)
Patient called in stating that her insurance will not cover the MRI or the CT and she is wanting to postpone those tests for right now until she figures out the next step. Please advise.

## 2019-04-22 NOTE — Telephone Encounter (Signed)
Sent patient a Clinical biochemist. Waiting response from her.

## 2019-04-22 NOTE — Telephone Encounter (Signed)
We could consider an ultrasound if her insurance covers? She needs to call and check and let us know please.

## 2019-05-22 ENCOUNTER — Ambulatory Visit (INDEPENDENT_AMBULATORY_CARE_PROVIDER_SITE_OTHER): Payer: PRIVATE HEALTH INSURANCE | Admitting: Internal Medicine

## 2019-05-22 ENCOUNTER — Encounter: Payer: Self-pay | Admitting: Internal Medicine

## 2019-05-22 DIAGNOSIS — J3489 Other specified disorders of nose and nasal sinuses: Secondary | ICD-10-CM | POA: Diagnosis not present

## 2019-05-22 MED ORDER — METHYLPREDNISOLONE 4 MG PO TBPK: ORAL_TABLET | ORAL | 0 refills | Status: DC

## 2019-05-22 NOTE — Assessment & Plan Note (Signed)
She is experiencing sinus pressure and headache that began last night No other concerning symptoms consistent with sinus infection Definitely has sinus pressure, but no obvious sinus infection and symptoms just started last night We will try Medrol Dosepak to see if that helps Can use over-the-counter allergy medication, nasal spray If no improvement or symptoms evolve and are more consistent with possible sinus infection she will let me know

## 2019-05-22 NOTE — Progress Notes (Signed)
Virtual Visit via Video Note  I connected with Connie Wilson on 05/22/19 at  1:45 PM EDT by a video enabled telemedicine application and verified that I am speaking with the correct person using two identifiers.   I discussed the limitations of evaluation and management by telemedicine and the availability of in person appointments. The patient expressed understanding and agreed to proceed.  The patient is currently at home and I am in the office.    No referring provider.    History of Present Illness: This is an acute visit for headache/sinus pressure.  Last night she started experiencing a frontal headache and pain over her left eyebrow.  She states if she presses on her cheeks it helps that pain.  She has had some sneezing, but no other symptoms.  She denies any fevers, chills, nasal congestion, ear pain, sore throat, cough, wheeze or shortness of breath.  She has not had any dizziness.  She tried taking Advil, but it did not help.  She does have some allergies, but nothing significant.  She did not try any other medication.  She was concerned she was getting an infection.  Her last infection was a couple of years ago.    Review of Systems  Constitutional: Negative for chills and fever.  HENT: Positive for sinus pain. Negative for congestion, ear pain and sore throat.        Sneezing  Respiratory: Negative for cough, shortness of breath and wheezing.   Neurological: Positive for headaches. Negative for dizziness.     Social History   Socioeconomic History  . Marital status: Divorced    Spouse name: Not on file  . Number of children: 2  . Years of education: Not on file  . Highest education level: Not on file  Occupational History  . Not on file  Social Needs  . Financial resource strain: Not on file  . Food insecurity    Worry: Not on file    Inability: Not on file  . Transportation needs    Medical: Not on file    Non-medical: Not on file  Tobacco Use  . Smoking  status: Former Smoker    Quit date: 03/08/1982    Years since quitting: 37.2  . Smokeless tobacco: Never Used  Substance and Sexual Activity  . Alcohol use: No  . Drug use: No  . Sexual activity: Not Currently    Birth control/protection: Post-menopausal  Lifestyle  . Physical activity    Days per week: Not on file    Minutes per session: Not on file  . Stress: Not on file  Relationships  . Social Herbalist on phone: Not on file    Gets together: Not on file    Attends religious service: Not on file    Active member of club or organization: Not on file    Attends meetings of clubs or organizations: Not on file    Relationship status: Not on file  Other Topics Concern  . Not on file  Social History Narrative  . Not on file     Observations/Objective: Appears well in NAD No facial swelling.  Breathing normally  Assessment and Plan:  See Problem List for Assessment and Plan of chronic medical problems.   Follow Up Instructions:    I discussed the assessment and treatment plan with the patient. The patient was provided an opportunity to ask questions and all were answered. The patient agreed with the plan and demonstrated an  understanding of the instructions.   The patient was advised to call back or seek an in-person evaluation if the symptoms worsen or if the condition fails to improve as anticipated.    Binnie Rail, MD

## 2019-05-23 ENCOUNTER — Telehealth: Payer: Self-pay | Admitting: *Deleted

## 2019-05-23 ENCOUNTER — Telehealth: Payer: Self-pay

## 2019-05-23 NOTE — Telephone Encounter (Signed)
Letter faxed to both numbers below. Pt informed.

## 2019-05-23 NOTE — Telephone Encounter (Signed)
Copied from [place] [id number]. Topic: General - Inquiry >> May 22, 2019  4:20 PM Virl Axe D wrote: Reason for CRM: Pt just finished her virtual visit with Dr. Quay Burow and forgot to tell her she needs doctor notes faxed to each of her employers about why she missed work today. Please advise. [employer] Fax [phone] Fax [phone]

## 2019-05-23 NOTE — Telephone Encounter (Signed)
Letter written and printed. Please fax. Thank you!!

## 2019-05-23 NOTE — Telephone Encounter (Signed)
Spoke with patient and info given. She has been told to take 6 pills day 1, then decrease by 1 pill each day.

## 2019-07-09 ENCOUNTER — Encounter: Payer: Self-pay | Admitting: Gynecology

## 2019-08-18 ENCOUNTER — Other Ambulatory Visit: Payer: Self-pay | Admitting: Family

## 2019-11-26 ENCOUNTER — Ambulatory Visit: Payer: PRIVATE HEALTH INSURANCE | Admitting: Family

## 2020-01-08 ENCOUNTER — Emergency Department (HOSPITAL_COMMUNITY): Payer: Worker's Compensation

## 2020-01-08 ENCOUNTER — Emergency Department (HOSPITAL_COMMUNITY)
Admission: EM | Admit: 2020-01-08 | Discharge: 2020-01-08 | Disposition: A | Discharge status: Home / Self Care | Payer: Worker's Compensation | Attending: Emergency Medicine | Admitting: Emergency Medicine

## 2020-01-08 ENCOUNTER — Encounter (HOSPITAL_COMMUNITY): Payer: Self-pay | Admitting: Emergency Medicine

## 2020-01-08 DIAGNOSIS — Z23 Encounter for immunization: Secondary | ICD-10-CM | POA: Diagnosis not present

## 2020-01-08 DIAGNOSIS — I10 Essential (primary) hypertension: Secondary | ICD-10-CM | POA: Insufficient documentation

## 2020-01-08 DIAGNOSIS — Y92000 Kitchen of unspecified non-institutional (private) residence as  the place of occurrence of the external cause: Secondary | ICD-10-CM | POA: Insufficient documentation

## 2020-01-08 DIAGNOSIS — S61210A Laceration without foreign body of right index finger without damage to nail, initial encounter: Secondary | ICD-10-CM | POA: Diagnosis present

## 2020-01-08 DIAGNOSIS — Z79899 Other long term (current) drug therapy: Secondary | ICD-10-CM | POA: Diagnosis not present

## 2020-01-08 DIAGNOSIS — Y990 Civilian activity done for income or pay: Secondary | ICD-10-CM | POA: Diagnosis not present

## 2020-01-08 DIAGNOSIS — Z87891 Personal history of nicotine dependence: Secondary | ICD-10-CM | POA: Insufficient documentation

## 2020-01-08 DIAGNOSIS — W25XXXA Contact with sharp glass, initial encounter: Secondary | ICD-10-CM | POA: Insufficient documentation

## 2020-01-08 DIAGNOSIS — Y93G1 Activity, food preparation and clean up: Secondary | ICD-10-CM | POA: Diagnosis not present

## 2020-01-08 MED ORDER — DOXYCYCLINE HYCLATE 100 MG PO CAPS: 100.0000 mg | ORAL_CAPSULE | Freq: Two times a day (BID) | ORAL | 0 refills | Status: AC

## 2020-01-08 MED ORDER — LIDOCAINE HCL 2 % IJ SOLN
10.0000 mL | Freq: Once | INTRAMUSCULAR | Status: AC
  Administered 2020-01-08: 200 mg via INTRADERMAL
  Filled 2020-01-08: qty 20

## 2020-01-08 MED ORDER — TETANUS-DIPHTH-ACELL PERTUSSIS 5-2.5-18.5 LF-MCG/0.5 IM SUSP
0.5000 mL | Freq: Once | INTRAMUSCULAR | Status: AC
  Administered 2020-01-08: 0.5 mL via INTRAMUSCULAR
  Filled 2020-01-08: qty 0.5

## 2020-01-08 NOTE — ED Provider Notes (Signed)
[facility] EMERGENCY DEPARTMENT Provider Note   CSN: [id number] Arrival date & time: 01/08/20  1643     History No chief complaint on file.   Connie Wilson is a 64 y.o. adult with history of hypertension, hyperlipidemia, arthritis, anxiety, allergies presents for evaluation of acute onset, persistent wound to the base of the right second digit sustained around 4:30 PM today.  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 when a dirty wine glass broke in her hand resulting in a laceration.  She did not clean the wound.  She is not sure if her tetanus is up-to-date.  She reports pain around the wound which does not radiate.  Denies numbness or weakness of the digits.  She is not on any blood thinners.   The history is provided by the patient.       Past Medical History:  Diagnosis Date  . Allergy   . Anxiety   . Arthritis   . Cataract    Bilateral eyes, has not needed surgery yet  . Depression   . Hyperlipidemia   . Hypertension     Patient Active Problem List   Diagnosis Date Noted  . Sinus pressure 05/22/2019  . Low back pain 01/04/2018  . Insomnia 10/19/2016  . Dizziness 10/19/2016  . Recurrent Clostridium difficile diarrhea 11/12/2015  . Elevated glucose 09/29/2012  . HTN (hypertension) 01/06/2012  . Hyperlipidemia 01/06/2012    Past Surgical History:  Procedure Laterality Date  . FRACTURE SURGERY    . fx. R ankle    . TUBAL LIGATION       OB History    Gravida  2   Para  2   Term      Preterm      AB      Living  2     SAB      TAB      Ectopic      Multiple      Live Births              Family History  Problem Relation Age of Onset  . Ovarian cancer Mother   . Cancer - Ovarian Mother   . Heart failure Father   . Colon cancer Father   . Hypertension Father   . Prostate cancer Father   . Hypertension Sister   . Hyperlipidemia Sister   . Cancer Sister        unknown  type- bladder, liver and lungs  . Heart attack Maternal Grandfather   . Diabetes Paternal Grandmother   . Stomach cancer Neg Hx   . Breast cancer Neg Hx     Social History   Tobacco Use  . Smoking status: Former Smoker    Quit date: 03/08/1982    Years since quitting: 37.8  . Smokeless tobacco: Never Used  Substance Use Topics  . Alcohol use: No  . Drug use: No    Home Medications Prior to Admission medications   Medication Sig Start Date End Date Taking? Authorizing Provider  Ascorbic Acid (VITAMIN C) 1000 MG tablet Take 1,000 mg by mouth daily.    [provider]  atorvastatin (LIPITOR) 20 MG tablet Take 1 tablet by mouth once daily 08/19/19   Olive Bass, FNP  calcium citrate (CALCITRATE - DOSED IN MG ELEMENTAL CALCIUM) 950 MG tablet Take 1 tablet by mouth 2 (two) times daily.    [provider]  citalopram (CELEXA) 20 MG tablet Take 1 tablet (20 mg total) by mouth daily. 03/05/19   Olive Bass, FNP  doxycycline (VIBRAMYCIN) 100 MG capsule Take 1 capsule (100 mg total) by mouth 2 (two) times daily for 5 days. 01/08/20 01/13/20  Michela Pitcher A, PA-C  fluticasone (FLONASE) 50 MCG/ACT nasal spray Place 2 sprays into both nostrils daily. 08/02/18   Olive Bass, FNP  Ginkgo Biloba Extract (GNP GINGKO BILOBA EXTRACT) 60 MG CAPS Take 1 each by mouth.    [provider]  lisinopril-hydrochlorothiazide (ZESTORETIC) 20-12.5 MG tablet Take 2 tablets by mouth daily. 03/05/19   Olive Bass, FNP  meloxicam (MOBIC) 15 MG tablet Take 1 tablet (15 mg total) by mouth daily. 08/28/18   Olive Bass, FNP  methocarbamol (ROBAXIN) 500 MG tablet Take 1 tablet (500 mg total) by mouth every 8 (eight) hours as needed. 08/28/18   Olive Bass, FNP  methylPREDNISolone (MEDROL DOSEPAK) 4 MG TBPK tablet 24 mg PO on day 1, then decr. by 4 mg/day x5 days 05/22/19   Pincus Sanes, MD  Multiple Vitamin (MULTIVITAMIN) tablet Take 1  tablet by mouth daily.    [provider]  Omega-3 Fatty Acids (FISH OIL) 1000 MG CAPS Take by mouth.    [provider]    Allergies    Clindamycin/lincomycin and Penicillins  Review of Systems   Review of Systems  Constitutional: Negative for chills and fever.  Skin: Positive for wound.  Neurological: Negative for weakness and numbness.  Hematological: Does not bruise/bleed easily.    Physical Exam Updated Vital Signs BP (!) 147/94 (BP Location: Left Arm)   Pulse 75   Temp 98.3 F (36.8 C) (Oral)   Resp 16   Ht 5\' 8"  (1.727 m)   Wt 97.5 kg   SpO2 99%   BMI 32.69 kg/m   Physical Exam Vitals and nursing note reviewed.  Constitutional:      General: She is not in acute distress.    Appearance: She is well-developed.  HENT:     Head: Normocephalic and atraumatic.  Eyes:     General:        Right eye: No discharge.        Left eye: No discharge.     Conjunctiva/sclera: Conjunctivae normal.  Neck:     Vascular: No JVD.     Trachea: No tracheal deviation.  Cardiovascular:     Rate and Rhythm: Normal rate.     Pulses: Normal pulses.     Comments: 2+ radial pulses bilaterally Pulmonary:     Effort: Pulmonary effort is normal.  Abdominal:     General: There is no distension.  Musculoskeletal:        General: Normal range of motion.     Comments: See below image.  Patient with 3 cm linear laceration to the dorsum of the right hand at the base of the right second digit.  Normal range of motion of the digits and she has 5/5 strength of flexion extension against resistance of the digits and wrist.  Bleeding is controlled.  Skin:    General: Skin is warm and dry.     Capillary Refill: Capillary refill takes less than 2 seconds.     Findings: No erythema.  Neurological:     Mental Status: She is alert.     Comments: Sensation intact to light touch of bilateral hands.  Good grip strength bilaterally.  Psychiatric:  Behavior: Behavior normal.          ED Results / Procedures / Treatments   Labs (all labs ordered are listed, but only abnormal results are displayed) Labs Reviewed - No data to display  EKG None  Radiology DG Hand Complete Right  Result Date: 01/08/2020 CLINICAL DATA:  Laceration EXAM: RIGHT HAND - COMPLETE 3+ VIEW COMPARISON:  None. FINDINGS: Frontal, oblique, and lateral views of the right hand are obtained. No fracture, subluxation, or dislocation. 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 Soft tissues are unremarkable. No radiopaque foreign body. IMPRESSION: 1. No fracture or radiopaque foreign body. 2. Prominent osteoarthritis first carpometacarpal joint. Electronically Signed   By: Sharlet Salina M.D.   On: 01/08/2020 17:25    Procedures .Marland KitchenLaceration Repair  Date/Time: 01/08/2020 7:49 PM Performed by: Jeanie Sewer, PA-C Authorized by: Jeanie Sewer, PA-C   Consent:    Consent obtained:  Verbal   Consent given by:  Patient   Risks discussed:  Infection, need for additional repair, pain, poor cosmetic result and poor wound healing   Alternatives discussed:  No treatment and delayed treatment Universal protocol:    Procedure explained and questions answered to patient or proxy's satisfaction: yes     Relevant documents present and verified: yes     Test results available and properly labeled: yes     Imaging studies available: yes     Required blood products, implants, devices, and special equipment available: yes     Site/side marked: yes     Immediately prior to procedure, a time out was called: yes     Patient identity confirmed:  Verbally with patient Anesthesia (see MAR for exact dosages):    Anesthesia method:  Local infiltration Laceration details:    Location:  Finger   Finger location:  R index finger   Length (cm):  3   Depth (mm):  2 Repair type:    Repair type:  Intermediate Pre-procedure  details:    Preparation:  Patient was prepped and draped in usual sterile fashion and imaging obtained to evaluate for foreign bodies Exploration:    Hemostasis achieved with:  Direct pressure   Wound exploration: wound explored through full range of motion     Wound extent: areolar tissue violated     Wound extent: no foreign bodies/material noted, no muscle damage noted, no tendon damage noted and no underlying fracture noted   Treatment:    Area cleansed with:  Betadine   Amount of cleaning:  Extensive   Irrigation solution:  Tap water   Irrigation method:  Tap   Visualized foreign bodies/material removed: no   Skin repair:    Repair method:  Sutures   Suture size:  4-0   Suture material:  Prolene   Suture technique:  Simple interrupted   Number of sutures:  4 Approximation:    Approximation:  Close Post-procedure details:    Dressing:  Sterile dressing   Patient tolerance of procedure:  Tolerated well, no immediate complications   (including critical care time)  Medications Ordered in ED Medications  Tdap (BOOSTRIX) injection 0.5 mL (0.5 mLs Intramuscular Given 01/08/20 1904)  lidocaine (XYLOCAINE) 2 % (with pres) injection 200 mg (200 mg Intradermal Given by Other 01/08/20 1958)    ED Course  I have reviewed the triage vital signs and the nursing notes.  Pertinent labs & imaging results that were available during my care of the patient were  reviewed by me and considered in my medical decision making (see chart for details).    MDM Rules/Calculators/A&P                      Patient presenting for evaluation of finger laceration sustained earlier today.  No evidence of tendon disruption.  She is neurovascularly intact and bleeding is controlled.  She is afebrile and vital signs are stable.  Pressure irrigation performed. Wound explored and base of wound visualized in a bloodless field without evidence of foreign body.  Laceration occurred < 8 hours prior to repair which was  well tolerated. Tdap updated.  Will discharge with short course of antibiotics.  Discussed suture home care with patient and answered questions.  Patient to follow-up for wound check and suture removal in 7 days; discussed indications for return to the ED sooner. Patient verbalized understanding of and agreement with plan and is safe for discharge home at this time.   Final Clinical Impression(s) / ED Diagnoses Final diagnoses:  Laceration of right index finger without foreign body without damage to nail, initial encounter    Rx / DC Orders ED Discharge Orders         Ordered    doxycycline (VIBRAMYCIN) 100 MG capsule  2 times daily     01/08/20 1952           Debroah Baller 01/09/20 1121    Lucrezia Starch, MD 01/13/20 [phone]

## 2020-01-08 NOTE — ED Triage Notes (Signed)
Pt here from work with a lac to her her hand across the knuckle from a wine glass  bleeding controlled

## 2020-01-08 NOTE — Discharge Instructions (Signed)
1. Medications: Alternate 600 mg of ibuprofen and [phone] mg of Tylenol every 3 hours as needed for pain. Do not exceed 4000 mg of Tylenol daily.  Take ibuprofen with food to avoid upset stomach issues. Please take all of your antibiotics until finished!   Take your antibiotics with food.  Common side effects of antibiotics include nausea, vomiting, abdominal discomfort, and diarrhea. You may help offset some of this with probiotics which you can buy or get in yogurt. Do not eat  or take the probiotics until 2 hours after your antibiotic.   2. Treatment: ice for swelling, keep wound clean with warm soap and water and keep bandage dry, do not submerge in water for 24 hours 3. Follow Up: Please return in 7 days to have your stitches/staples removed or sooner if you have concerns.  You may also go to urgent care or your primary care physician.  Return to the emergency department sooner if any concerning signs or symptoms develop such as high fevers, redness, drainage of pus from the wound, or swelling.   WOUND CARE  Keep area clean and dry for 24 hours. Do not remove bandage, if applied.  After 24 hours, remove bandage and wash wound gently with mild soap and warm water. Reapply a new bandage after cleaning wound, if directed.   Continue daily cleansing with soap and water until stitches/staples are removed.  Do not apply any ointments or creams to the wound while stitches/staples are in place, as this may cause delayed healing. Return if you experience any of the following signs of infection: Swelling, redness, pus drainage, streaking, fever >101.0 F  Return if you experience excessive bleeding that does not stop after 15-20 minutes of constant, firm pressure.

## 2020-03-16 ENCOUNTER — Ambulatory Visit (INDEPENDENT_AMBULATORY_CARE_PROVIDER_SITE_OTHER): Payer: PRIVATE HEALTH INSURANCE

## 2020-03-16 ENCOUNTER — Other Ambulatory Visit: Payer: Self-pay

## 2020-03-16 ENCOUNTER — Encounter: Payer: Self-pay | Admitting: Family

## 2020-03-16 ENCOUNTER — Ambulatory Visit (INDEPENDENT_AMBULATORY_CARE_PROVIDER_SITE_OTHER): Payer: PRIVATE HEALTH INSURANCE | Admitting: Family

## 2020-03-16 VITALS — BP 118/82 | HR 83 | Temp 98.7°F | Wt 224.2 lb

## 2020-03-16 DIAGNOSIS — F339 Major depressive disorder, recurrent, unspecified: Secondary | ICD-10-CM

## 2020-03-16 DIAGNOSIS — E785 Hyperlipidemia, unspecified: Secondary | ICD-10-CM | POA: Diagnosis not present

## 2020-03-16 DIAGNOSIS — I10 Essential (primary) hypertension: Secondary | ICD-10-CM | POA: Diagnosis not present

## 2020-03-16 DIAGNOSIS — M25562 Pain in left knee: Secondary | ICD-10-CM

## 2020-03-16 DIAGNOSIS — R5383 Other fatigue: Secondary | ICD-10-CM | POA: Diagnosis not present

## 2020-03-16 DIAGNOSIS — R55 Syncope and collapse: Secondary | ICD-10-CM

## 2020-03-16 LAB — CBC WITH DIFFERENTIAL/PLATELET
Basophils Absolute: 0.1 10*3/uL (ref 0.0–0.1)
Basophils Relative: 0.7 % (ref 0.0–3.0)
Eosinophils Absolute: 0.1 10*3/uL (ref 0.0–0.7)
Eosinophils Relative: 1.2 % (ref 0.0–5.0)
HCT: 38.6 % (ref 36.0–46.0)
Hemoglobin: 13.2 g/dL (ref 12.0–15.0)
Lymphocytes Relative: 25.7 % (ref 12.0–46.0)
Lymphs Abs: 2.3 10*3/uL (ref 0.7–4.0)
MCHC: 34.3 g/dL (ref 30.0–36.0)
MCV: 85.5 fl (ref 78.0–100.0)
Monocytes Absolute: 0.5 10*3/uL (ref 0.1–1.0)
Monocytes Relative: 5.9 % (ref 3.0–12.0)
Neutro Abs: 5.9 10*3/uL (ref 1.4–7.7)
Neutrophils Relative %: 66.5 % (ref 43.0–77.0)
Platelets: 222 10*3/uL (ref 150.0–400.0)
RBC: 4.51 Mil/uL (ref 3.87–5.11)
RDW: 13.6 % (ref 11.5–15.5)
WBC: 8.9 10*3/uL (ref 4.0–10.5)

## 2020-03-16 LAB — LDL CHOLESTEROL, DIRECT: Direct LDL: 189 mg/dL

## 2020-03-16 LAB — TSH: TSH: 1.99 u[IU]/mL (ref 0.35–4.50)

## 2020-03-16 LAB — COMPREHENSIVE METABOLIC PANEL
ALT: 21 U/L (ref 0–35)
AST: 20 U/L (ref 0–37)
Albumin: 4 g/dL (ref 3.5–5.2)
Alkaline Phosphatase: 80 U/L (ref 39–117)
BUN: 14 mg/dL (ref 6–23)
CO2: 29 mEq/L (ref 19–32)
Calcium: 9.1 mg/dL (ref 8.4–10.5)
Chloride: 101 mEq/L (ref 96–112)
Creatinine, Ser: 0.8 mg/dL (ref 0.40–1.20)
GFR: 72.18 mL/min (ref >60.00)
Glucose, Bld: 137 mg/dL — ABNORMAL HIGH (ref 70–99)
Potassium: 3.6 mEq/L (ref 3.5–5.1)
Sodium: 137 mEq/L (ref 135–145)
Total Bilirubin: 0.5 mg/dL (ref 0.2–1.2)
Total Protein: 6.9 g/dL (ref 6.0–8.3)

## 2020-03-16 LAB — LIPID PANEL
Cholesterol: 271 mg/dL — ABNORMAL HIGH (ref 0–200)
HDL: 38.6 mg/dL — ABNORMAL LOW (ref >39.00)
NonHDL: 232.68
Total CHOL/HDL Ratio: 7
Triglycerides: 274 mg/dL — ABNORMAL HIGH (ref 0.0–149.0)
VLDL: 54.8 mg/dL — ABNORMAL HIGH (ref 0.0–40.0)

## 2020-03-16 MED ORDER — CITALOPRAM HYDROBROMIDE 20 MG PO TABS: 20.0000 mg | ORAL_TABLET | Freq: Every day | ORAL | 3 refills | Status: DC

## 2020-03-16 MED ORDER — LISINOPRIL-HYDROCHLOROTHIAZIDE 20-12.5 MG PO TABS: 1.0000 | ORAL_TABLET | Freq: Every day | ORAL | 3 refills | Status: DC

## 2020-03-16 MED ORDER — MELOXICAM 15 MG PO TABS: 15.0000 mg | ORAL_TABLET | Freq: Every day | ORAL | 0 refills | Status: DC

## 2020-03-16 NOTE — Progress Notes (Signed)
Connie Wilson is a 65 y.o. adult with the following history as recorded in EpicCare:  Patient Active Problem List   Diagnosis Date Noted   Sinus pressure 05/22/2019   Low back pain 01/04/2018   Insomnia 10/19/2016   Dizziness 10/19/2016   Recurrent Clostridium difficile diarrhea 11/12/2015   Elevated glucose 09/29/2012   HTN (hypertension) 01/06/2012   Hyperlipidemia 01/06/2012    Current Outpatient Medications  Medication Sig Dispense Refill   Ascorbic Acid (VITAMIN C) 1000 MG tablet Take 1,000 mg by mouth daily.     calcium citrate (CALCITRATE - DOSED IN MG ELEMENTAL CALCIUM) 950 MG tablet Take 1 tablet by mouth 2 (two) times daily.     citalopram (CELEXA) 20 MG tablet Take 1 tablet (20 mg total) by mouth daily. 90 tablet 3   fluticasone (FLONASE) 50 MCG/ACT nasal spray Place 2 sprays into both nostrils daily. 16 g 6   lisinopril-hydrochlorothiazide (ZESTORETIC) 20-12.5 MG tablet Take 1 tablet by mouth daily. 180 tablet 3   Multiple Vitamin (MULTIVITAMIN) tablet Take 1 tablet by mouth daily.     Omega-3 Fatty Acids (FISH OIL) 1000 MG CAPS Take by mouth.     meloxicam (MOBIC) 15 MG tablet Take 1 tablet (15 mg total) by mouth daily. 30 tablet 0   No current facility-administered medications for this visit.    Allergies: Clindamycin/lincomycin and Penicillins  Past Medical History:  Diagnosis Date   Allergy    Anxiety    Arthritis    Cataract    Bilateral eyes, has not needed surgery yet   Depression    Hyperlipidemia    Hypertension     Past Surgical History:  Procedure Laterality Date   FRACTURE SURGERY     fx. R ankle     TUBAL LIGATION      Family History  Problem Relation Age of Onset   Ovarian cancer Mother    Cancer - Ovarian Mother    Heart failure Father    Colon cancer Father    Hypertension Father    Prostate cancer Father    Hypertension Sister    Hyperlipidemia Sister    Cancer Sister        unknown type-  bladder, liver and lungs   Heart attack Maternal Grandfather    Diabetes Paternal Grandmother    Stomach cancer Neg Hx    Breast cancer Neg Hx     Social History   Tobacco Use   Smoking status: Former Smoker    Quit date: 03/08/1982    Years since quitting: 38.0   Smokeless tobacco: Never Used  Substance Use Topics   Alcohol use: No    Subjective:  Presents with numerous concerns:  1) Left knee pain "for a while"- felt that it got much worse on Friday afternoon; localized on inner part of her knee and in the back of her knee; 2) Casually mentions that she passed out on one episode last month- did not go to ER or [facility]; did not follow-up here; notes that the pain when she hit her knee is "what woke her up." 3) Is only taking her blood pressure medication one tablet per day now- not 2 as prescribed; 4) She opted to stop Lipitor; does not remember why. 5) Needs refill on her Celexa;     Objective:  Vitals:   03/16/20 0837  BP: 118/82  Pulse: 83  Temp: 98.7 F (37.1 C)  TempSrc: Oral  SpO2: 97%  Weight: 224 lb 3.2 oz (101.7  kg)    General: Well developed, well nourished, in no acute distress  Skin : Warm and dry.  Head: Normocephalic and atraumatic  Neck: Supple without thyromegaly, adenopathy  Lungs: Respirations unlabored; clear to auscultation bilaterally without wheeze, rales, rhonchi  CVS exam: normal rate and regular rhythm.  Musculoskeletal: No deformities; no active joint inflammation; arthritis changes noted of left knee Extremities: No edema, cyanosis, clubbing  Vessels: Symmetric bilaterally  Neurologic: Alert and oriented; speech intact; face symmetrical; moves all extremities well; CNII-XII intact without focal deficit    Assessment:  1. Acute pain of left knee   2. Essential hypertension   3. Hyperlipidemia, unspecified hyperlipidemia type   4. Other fatigue   5. Episode of recurrent major depressive disorder, unspecified depression episode severity  ([place])   6. Syncope, unspecified syncope type     Plan:  1. Update left knee x-ray; Rx for Mobic 15 mg daily; will most likely need to see sports medicine. 2. Stable- adjusted medication list to indicate only taking 1 Lisinopril HCT daily; 3. Check lipid panel today; 4. Check TSH today; 5. Stable- refill updated; 6. Refer to cardiology; may also need to consider neurology consult;  This visit occurred during the SARS-CoV-2 public health emergency.  Safety protocols were in place, including screening questions prior to the visit, additional usage of staff PPE, and extensive cleaning of exam room while observing appropriate contact time as indicated for disinfecting solutions.     No follow-ups on file.  Orders Placed This Encounter  Procedures   DG Knee Complete 4 Views Left    Standing Status:   Future    Standing Expiration Date:   03/16/2021    Order Specific Question:   Reason for Exam (SYMPTOM  OR DIAGNOSIS REQUIRED)    Answer:   left knee pain    Order Specific Question:   Preferred imaging location?    Answer:   [facility]    Order Specific Question:   Radiology Contrast Protocol - do NOT remove file path    Answer:   [email or web address]   CBC with Differential/Platelet   Comp Met (CMET)   TSH   Lipid panel   Ambulatory referral to Cardiology    Referral Priority:   Routine    Referral Type:   Consultation    Referral Reason:   Specialty Services Required    Requested Specialty:   Cardiology    Number of Visits Requested:   1    Requested Prescriptions   Signed Prescriptions Disp Refills   citalopram (CELEXA) 20 MG tablet 90 tablet 3    Sig: Take 1 tablet (20 mg total) by mouth daily.   lisinopril-hydrochlorothiazide (ZESTORETIC) 20-12.5 MG tablet 180 tablet 3    Sig: Take 1 tablet by mouth daily.   meloxicam (MOBIC) 15 MG tablet 30 tablet 0    Sig: Take 1 tablet (15 mg total) by mouth daily.

## 2020-03-17 ENCOUNTER — Telehealth: Payer: Self-pay | Admitting: Family

## 2020-03-17 ENCOUNTER — Other Ambulatory Visit (INDEPENDENT_AMBULATORY_CARE_PROVIDER_SITE_OTHER): Payer: PRIVATE HEALTH INSURANCE

## 2020-03-17 DIAGNOSIS — R7301 Impaired fasting glucose: Secondary | ICD-10-CM | POA: Diagnosis not present

## 2020-03-17 LAB — HEMOGLOBIN A1C: Hgb A1c MFr Bld: 6.4 % (ref 4.6–6.5)

## 2020-03-17 NOTE — Telephone Encounter (Signed)
    Please return call to patient to discuss imaging results 

## 2020-03-17 NOTE — Telephone Encounter (Signed)
Called pt concerning labs & xray result. See lab report.Marland KitchenRaechel Wilson

## 2020-03-30 ENCOUNTER — Other Ambulatory Visit: Payer: Self-pay

## 2020-03-31 ENCOUNTER — Ambulatory Visit: Payer: PRIVATE HEALTH INSURANCE | Admitting: Cardiovascular Disease

## 2020-04-01 ENCOUNTER — Telehealth: Payer: Self-pay | Admitting: *Deleted

## 2020-04-01 ENCOUNTER — Encounter: Payer: Self-pay | Admitting: Nurse Practitioner

## 2020-04-01 ENCOUNTER — Ambulatory Visit: Payer: PRIVATE HEALTH INSURANCE | Admitting: Nurse Practitioner

## 2020-04-01 ENCOUNTER — Other Ambulatory Visit: Payer: Self-pay

## 2020-04-01 VITALS — BP 120/82

## 2020-04-01 DIAGNOSIS — R2233 Localized swelling, mass and lump, upper limb, bilateral: Secondary | ICD-10-CM | POA: Diagnosis not present

## 2020-04-01 NOTE — Patient Instructions (Signed)
[facility] [phone] [address]  [place], [place] [address]  

## 2020-04-01 NOTE — Progress Notes (Signed)
   Acute Office Visit  Subjective:    Patient ID: Connie Wilson, adult    DOB: Aug 02, 1956, 64 y.o.   MRN: [id number]  Chief Complaint  Patient presents with  . breast exam    ml,knots under armpits     HPI 64 year old presents today for nontender knots in underarms that she notices when she is applying deodorant, she does not feel them when she palpates.  Denies noticing lumps in breast, nipple discharge, and skin changes.  Last mammogram 03/11/2017 was normal.   Review of Systems  Constitutional: Negative for fatigue, fever and unexpected weight change.  Skin: Negative.   Hematological: Negative for adenopathy. Does not bruise/bleed easily.       Objective:    Physical Exam Constitutional:      Appearance: She is obese.  Chest:     Breasts:        Right: Normal.        Left: Normal.     Comments: No lumps felt on exam. No TTP Lymphadenopathy:     Upper Body:     Right upper body: No supraclavicular or axillary adenopathy.     Left upper body: No supraclavicular or axillary adenopathy.     BP 120/82 (BP Location: Right Arm, Patient Position: Sitting, Cuff Size: Normal)  Wt Readings from Last 3 Encounters:  03/16/20 224 lb 3.2 oz (101.7 kg)  01/08/20 215 lb (97.5 kg)  03/05/19 202 lb (91.6 kg)        Assessment & Plan:   Problem List Items Addressed This Visit    None    Visit Diagnoses    Mass of both axillae    -  Primary     Plan: Normal exam today.  We will send referral for bilateral diagnostic mammogram to include axillary.  Patient is agreeable to plan.     Olivia Mackie [facility], 8:56 AM 04/01/2020

## 2020-04-01 NOTE — Telephone Encounter (Signed)
-----   Message from Olivia Mackie, NP sent at 04/01/2020  9:05 AM EDT ----- Please send referral for diagnostic mammogram of bilateral axillaries. She is also overdue for screening mammogram. Thank you!

## 2020-04-01 NOTE — Telephone Encounter (Signed)
Orders placed will route a message to [facility] program due to insurance. They will call to schedule

## 2020-04-02 ENCOUNTER — Telehealth: Payer: Self-pay

## 2020-04-02 NOTE — Telephone Encounter (Signed)
Telephoned patient at home phone number. Left a voice message with [facility] contact information.

## 2020-04-06 NOTE — Telephone Encounter (Signed)
Patient scheduled on 04/23/20 at breast center.

## 2020-04-13 ENCOUNTER — Emergency Department (HOSPITAL_COMMUNITY)
Admission: EM | Admit: 2020-04-13 | Discharge: 2020-04-13 | Disposition: A | Discharge status: Home / Self Care | Payer: PRIVATE HEALTH INSURANCE | Attending: Emergency Medicine | Admitting: Emergency Medicine

## 2020-04-13 ENCOUNTER — Emergency Department (HOSPITAL_COMMUNITY): Payer: PRIVATE HEALTH INSURANCE

## 2020-04-13 ENCOUNTER — Encounter (HOSPITAL_COMMUNITY): Payer: Self-pay

## 2020-04-13 ENCOUNTER — Other Ambulatory Visit: Payer: Self-pay

## 2020-04-13 DIAGNOSIS — Y92009 Unspecified place in unspecified non-institutional (private) residence as the place of occurrence of the external cause: Secondary | ICD-10-CM | POA: Insufficient documentation

## 2020-04-13 DIAGNOSIS — I10 Essential (primary) hypertension: Secondary | ICD-10-CM | POA: Insufficient documentation

## 2020-04-13 DIAGNOSIS — M545 Low back pain, unspecified: Secondary | ICD-10-CM

## 2020-04-13 DIAGNOSIS — W010XXA Fall on same level from slipping, tripping and stumbling without subsequent striking against object, initial encounter: Secondary | ICD-10-CM | POA: Diagnosis not present

## 2020-04-13 DIAGNOSIS — Z87891 Personal history of nicotine dependence: Secondary | ICD-10-CM | POA: Diagnosis not present

## 2020-04-13 DIAGNOSIS — Y9389 Activity, other specified: Secondary | ICD-10-CM | POA: Diagnosis not present

## 2020-04-13 DIAGNOSIS — Y999 Unspecified external cause status: Secondary | ICD-10-CM | POA: Diagnosis not present

## 2020-04-13 DIAGNOSIS — Z79899 Other long term (current) drug therapy: Secondary | ICD-10-CM | POA: Diagnosis not present

## 2020-04-13 HISTORY — DX: Unspecified fracture of lower end of unspecified humerus, initial encounter for closed fracture: S42.409A

## 2020-04-13 MED ORDER — OXYCODONE-ACETAMINOPHEN 5-325 MG PO TABS
1.0000 | ORAL_TABLET | Freq: Once | ORAL | Status: AC
  Administered 2020-04-13: 1 via ORAL
  Filled 2020-04-13: qty 1

## 2020-04-13 MED ORDER — CYCLOBENZAPRINE HCL 5 MG PO TABS: 5.0000 mg | ORAL_TABLET | Freq: Every day | ORAL | 0 refills | Status: DC | PRN

## 2020-04-13 MED ORDER — HYDROCODONE-ACETAMINOPHEN 5-325 MG PO TABS: 1.0000 | ORAL_TABLET | Freq: Four times a day (QID) | ORAL | 0 refills | Status: DC | PRN

## 2020-04-13 MED ORDER — KETOROLAC TROMETHAMINE 30 MG/ML IJ SOLN
15.0000 mg | Freq: Once | INTRAMUSCULAR | Status: AC
  Administered 2020-04-13: 15 mg via INTRAMUSCULAR
  Filled 2020-04-13: qty 1

## 2020-04-13 MED ORDER — METHOCARBAMOL 500 MG PO TABS
500.0000 mg | ORAL_TABLET | Freq: Once | ORAL | Status: AC
  Administered 2020-04-13: 500 mg via ORAL
  Filled 2020-04-13: qty 1

## 2020-04-13 NOTE — ED Triage Notes (Addendum)
Patient is from home. Patient slipped on a wet floor. Patient denies hitting her head. Patient c/o mid left back pain and left flank pain. MAE x 4  Patient states she is having left hip pain as well.

## 2020-04-13 NOTE — Discharge Instructions (Addendum)
Please read instructions below.  You can take hydrocodone every 6 hours as needed for severe/breakthrough pain. Take your prescribed meloxicam as directed for mild-moderate pain. Apply ice to your back for 20 minutes at a time.  You can also apply heat if this provides more relief.   You can take Flexeril/cyclobenzaprine every 12 hours as needed for muscle spasm.  Be aware this medication can make you drowsy; do not take while driving or drinking alcohol.   Follow-up with your primary care provider.   Return to ER if new numbness or tingling in your arms or legs, inability to urinate, inability to hold your bowels, or weakness in your extremities.

## 2020-04-13 NOTE — ED Provider Notes (Signed)
[facility]-EMERGENCY DEPT Provider Note   CSN: [id number] Arrival date & time: 04/13/20  1544     History Chief Complaint  Patient presents with  . Fall  . Back Pain  . Flank Pain    Connie Wilson is a 64 y.o. female presenting to the emergency department with left-sided back pain after mechanical fall that occurred prior to arrival.  She states she accidentally left some water on the floor near her dog's bowl and slipped on it.  When she slipped her left leg tucked under her and she fell directly on her bottom.  She did not hit her head or pass out.  Her pain is to her left lower back that is worse with movement and palpation.  She has no radiating pain down her leg.  No pain in her groin.  No pain in her midline back.  Denies numbness or weakness in extremities, bowel or bladder incontinence.  She scraped her elbow on the dresser, however is having no pain in the joint is able to range it without difficulty.  The history is provided by the patient.       Past Medical History:  Diagnosis Date  . Allergy   . Anxiety   . Arthritis   . Cataract    Bilateral eyes, has not needed surgery yet  . Depression   . Fracture of elbow    right  . Hyperlipidemia   . Hypertension     Patient Active Problem List   Diagnosis Date Noted  . Sinus pressure 05/22/2019  . Low back pain 01/04/2018  . Insomnia 10/19/2016  . Dizziness 10/19/2016  . Recurrent Clostridium difficile diarrhea 11/12/2015  . Elevated glucose 09/29/2012  . HTN (hypertension) 01/06/2012  . Hyperlipidemia 01/06/2012    Past Surgical History:  Procedure Laterality Date  . FRACTURE SURGERY    . fx. R ankle    . TUBAL LIGATION       OB History    Gravida  4   Para  2   Term      Preterm      AB  2   Living  2     SAB      TAB      Ectopic      Multiple      Live Births              Family History  Problem Relation Age of Onset  . Ovarian cancer Mother   .  Cancer - Ovarian Mother   . Heart failure Father   . Colon cancer Father   . Hypertension Father   . Prostate cancer Father   . Hypertension Sister   . Hyperlipidemia Sister   . Cancer Sister        unknown type- bladder, liver and lungs  . Heart attack Maternal Grandfather   . Diabetes Paternal Grandmother   . Stomach cancer Neg Hx   . Breast cancer Neg Hx     Social History   Tobacco Use  . Smoking status: Former Smoker    Quit date: 03/08/1982    Years since quitting: 38.1  . Smokeless tobacco: Never Used  Vaping Use  . Vaping Use: Never used  Substance Use Topics  . Alcohol use: No  . Drug use: No    Home Medications Prior to Admission medications   Medication Sig Start Date End Date Taking? Authorizing Provider  Ascorbic Acid (VITAMIN C) 1000 MG tablet Take 1,000  mg by mouth daily.    [provider]  calcium citrate (CALCITRATE - DOSED IN MG ELEMENTAL CALCIUM) 950 MG tablet Take 1 tablet by mouth 2 (two) times daily.    [provider]  citalopram (CELEXA) 20 MG tablet Take 1 tablet (20 mg total) by mouth daily. 03/16/20   Olive Bass, FNP  cyclobenzaprine (FLEXERIL) 5 MG tablet Take 1 tablet (5 mg total) by mouth daily as needed for muscle spasms. 04/13/20   Laster Appling, [place] N, PA-C  fluticasone (FLONASE) 50 MCG/ACT nasal spray Place 2 sprays into both nostrils daily. 08/02/18   Olive Bass, FNP  HYDROcodone-acetaminophen (NORCO/VICODIN) 5-325 MG tablet Take 1 tablet by mouth every 6 (six) hours as needed for severe pain. 04/13/20   Preslie Depasquale, [place] N, PA-C  lisinopril-hydrochlorothiazide (ZESTORETIC) 20-12.5 MG tablet Take 1 tablet by mouth daily. 03/16/20   Olive Bass, FNP  meloxicam (MOBIC) 15 MG tablet Take 1 tablet (15 mg total) by mouth daily. 03/16/20   Olive Bass, FNP  Multiple Vitamin (MULTIVITAMIN) tablet Take 1 tablet by mouth daily.    [provider]  Omega-3 Fatty Acids (FISH OIL) 1000 MG CAPS  Take by mouth.    [provider]    Allergies    Clindamycin/lincomycin and Penicillins  Review of Systems   Review of Systems  All other systems reviewed and are negative.   Physical Exam Updated Vital Signs BP (!) 150/102 (BP Location: Left Arm)   Pulse 76   Temp 97.6 F (36.4 C) (Oral)   Resp 20   Ht 5\' 9"  (1.753 m)   Wt 101.6 kg   SpO2 99% Comment: Simultaneous filing. User may not have seen previous data.  BMI 33.08 kg/m   Physical Exam Vitals and nursing note reviewed.  Constitutional:      Appearance: She is well-developed.  HENT:     Head: Normocephalic and atraumatic.  Eyes:     Conjunctiva/sclera: Conjunctivae normal.  Cardiovascular:     Rate and Rhythm: Normal rate and regular rhythm.  Pulmonary:     Effort: Pulmonary effort is normal. No respiratory distress.     Breath sounds: Normal breath sounds.  Musculoskeletal:       Back:     Comments: Left sided lumbar back TTP, no midline spinal tenderness, normal step-offs or gross deformities.  No pain in the groin with ROM of the hip.   Neurological:     Mental Status: She is alert.     Comments: Normal tone.  5/5 strength in BLE including strong and equal dorsiflexion/plantar flexion Sensory: Pinprick and light touch normal in BLE extremities.   Gait: deferred CV: distal pulses palpable throughout    Psychiatric:        Mood and Affect: Mood normal.        Behavior: Behavior normal.     ED Results / Procedures / Treatments   Labs (all labs ordered are listed, but only abnormal results are displayed) Labs Reviewed - No data to display  EKG None  Radiology DG Lumbar Spine Complete  Result Date: 04/13/2020 CLINICAL DATA:  Fall. LOWER back pain and posterior LEFT hip pain after falling while getting out of the bath tub. EXAM: LUMBAR SPINE - COMPLETE 4+ VIEW COMPARISON:  None. FINDINGS: There is normal alignment of the lumbar spine. Degenerative changes bursae in the facet joints. There  is mild uncovertebral spurring at numerous levels. Mild disc height loss at L5-S1. No acute fracture or traumatic subluxation.  Regional bowel gas pattern is nonobstructive. IMPRESSION: Mild degenerative changes. Electronically Signed   By: Norva Pavlov M.D.   On: 04/13/2020 17:44   DG Hip Unilat With Pelvis 2-3 Views Left  Result Date: 04/13/2020 CLINICAL DATA:  64 year old female with fall and left hip pain. EXAM: DG HIP (WITH OR WITHOUT PELVIS) 2-3V LEFT COMPARISON:  CT abdomen pelvis dated 10/21/2015. FINDINGS: There is no acute fracture or dislocation. The bones are well mineralized. Mild bilateral hip osteoarthritic changes. The soft tissues are grossly unremarkable. IMPRESSION: No acute fracture or dislocation. Electronically Signed   By: Elgie Collard M.D.   On: 04/13/2020 17:12    Procedures Procedures (including critical care time)  Medications Ordered in ED Medications  oxyCODONE-acetaminophen (PERCOCET/ROXICET) 5-325 MG per tablet 1 tablet (1 tablet Oral Given 04/13/20 1730)  methocarbamol (ROBAXIN) tablet 500 mg (500 mg Oral Given 04/13/20 1730)  ketorolac (TORADOL) 30 MG/ML injection 15 mg (15 mg Intramuscular Given 04/13/20 1731)    ED Course  I have reviewed the triage vital signs and the nursing notes.  Pertinent labs & imaging results that were available during my care of the patient were reviewed by me and considered in my medical decision making (see chart for details).    MDM Rules/Calculators/A&P                          Patient presenting with left-sided back pain after mechanical fall that occurred prior to arrival.  Pain is to the lateral back, no midline tenderness/pain.  No neuro deficits on exam.  No head trauma or LOC.  Not on anticoagulation.  Plain films of the hip and L-spine are negative.  She is treated for pain, with significant improvement in symptoms, able to ambulate without difficulty on reevaluation.  Will discharge with symptomatic management, PCP  follow-up, strict return precautions.  Final Clinical Impression(s) / ED Diagnoses Final diagnoses:  Acute left-sided low back pain without sciatica    Rx / DC Orders ED Discharge Orders         Ordered    HYDROcodone-acetaminophen (NORCO/VICODIN) 5-325 MG tablet  Every 6 hours PRN     Discontinue  Reprint     04/13/20 1901    cyclobenzaprine (FLEXERIL) 5 MG tablet  Daily PRN     Discontinue  Reprint     04/13/20 1901           Arleigh Dicola, [place] N, PA-C 04/13/20 1902    Charlynne Pander, MD 04/14/20 [phone]

## 2020-04-23 ENCOUNTER — Ambulatory Visit
Admission: RE | Admit: 2020-04-23 | Discharge: 2020-04-23 | Disposition: A | Discharge status: Home / Self Care | Payer: PRIVATE HEALTH INSURANCE | Source: Ambulatory Visit | Attending: Nurse Practitioner | Admitting: Nurse Practitioner

## 2020-04-23 ENCOUNTER — Other Ambulatory Visit: Payer: Self-pay

## 2020-04-23 DIAGNOSIS — R2233 Localized swelling, mass and lump, upper limb, bilateral: Secondary | ICD-10-CM

## 2020-06-18 ENCOUNTER — Encounter: Payer: Self-pay | Admitting: Obstetrics & Gynecology

## 2020-06-18 ENCOUNTER — Other Ambulatory Visit: Payer: Self-pay

## 2020-06-18 ENCOUNTER — Ambulatory Visit (INDEPENDENT_AMBULATORY_CARE_PROVIDER_SITE_OTHER): Payer: PRIVATE HEALTH INSURANCE | Admitting: Obstetrics & Gynecology

## 2020-06-18 VITALS — BP 138/86 | Ht 67.5 in | Wt 211.0 lb

## 2020-06-18 DIAGNOSIS — Z6832 Body mass index (BMI) 32.0-32.9, adult: Secondary | ICD-10-CM

## 2020-06-18 DIAGNOSIS — E6609 Other obesity due to excess calories: Secondary | ICD-10-CM

## 2020-06-18 DIAGNOSIS — Z780 Asymptomatic menopausal state: Secondary | ICD-10-CM | POA: Diagnosis not present

## 2020-06-18 DIAGNOSIS — Z01419 Encounter for gynecological examination (general) (routine) without abnormal findings: Secondary | ICD-10-CM

## 2020-06-18 NOTE — Progress Notes (Signed)
Connie Wilson May 12, 1956 [id number]   History:    64 y.o. G2P2 Divorced.  Abstinent.  14+ yo grand-son lives with her.  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  RP:  Established presenting for annual gyn exam   HPI:  Postmenopause.  No HRT.  No PMB.  No pelvic pain.  Normal vaginal secretions. Abstinent. Breasts normal.  Urine/BMs normal. Colono neg Feb 14, 2017.  Mother died of Ovarian Cancer at age 32.  BMI 32.56.  Past medical history,surgical history, family history and social history were all reviewed and documented in the EPIC chart.  Gynecologic History No LMP recorded. Patient is postmenopausal.  Obstetric History OB History  Gravida Para Term Preterm AB Living  4 2     2 2   SAB TAB Ectopic Multiple Live Births               # Outcome Date GA Lbr Len/2nd Weight Sex Delivery Anes PTL Lv  4 AB           3 AB           2 Para           1 Para              ROS: A ROS was performed and pertinent positives and negatives are included in the history.  GENERAL: No fevers or chills. HEENT: No change in vision, no earache, sore throat or sinus congestion. NECK: No pain or stiffness. CARDIOVASCULAR: No chest pain or pressure. No palpitations. PULMONARY: No shortness of breath, cough or wheeze. GASTROINTESTINAL: No abdominal pain, nausea, vomiting or diarrhea, melena or bright red blood per rectum. GENITOURINARY: No urinary frequency, urgency, hesitancy or dysuria. MUSCULOSKELETAL: No joint or muscle pain, no back pain, no recent trauma. DERMATOLOGIC: No rash, no itching, no lesions. ENDOCRINE: No polyuria, polydipsia, no heat or cold intolerance. No recent change in weight. HEMATOLOGICAL: No anemia or easy bruising or bleeding. NEUROLOGIC: No headache, seizures, numbness, tingling or weakness. PSYCHIATRIC: No depression, no loss of interest in normal activity or change in sleep pattern.     Exam:   BP 138/86   Ht 5' 7.5" (1.715 m)   Wt 211 lb (95.7 kg)   BMI 32.56 kg/m   Body mass index is 32.56  kg/m.  General appearance : Well developed well nourished female. No acute distress HEENT: Eyes: no retinal hemorrhage or exudates,  Neck supple, trachea midline, no carotid bruits, no thyroidmegaly Lungs: Clear to auscultation, no rhonchi or wheezes, or rib retractions  Heart: Regular rate and rhythm, no murmurs or gallops Breast:Examined in sitting and supine position were symmetrical in appearance, no palpable masses or tenderness,  no skin retraction, no nipple inversion, no nipple discharge, no skin discoloration, no axillary or supraclavicular lymphadenopathy Abdomen: no palpable masses or tenderness, no rebound or guarding Extremities: no edema or skin discoloration or tenderness  Pelvic: Vulva: Normal             Vagina: No gross lesions or discharge  Cervix: No gross lesions or discharge.  Pap reflex done.  Uterus  AV, normal size, shape and consistency, non-tender and mobile  Adnexa  Without masses or tenderness  Anus: Normal   Assessment/Plan:  64 y.o. female for annual exam   1. Encounter for routine gynecological examination with Papanicolaou smear of cervix Normal gynecologic exam.  Pap reflex done.  Breast exam normal.  Bilateral diagnostic mammogram in July 2021 was negative.  Fasting health labs with family physician.  Colonoscopy in  2018.  Mother with diagnosis of ovarian cancer at age 75.  Patient's gynecologic exam negative today.  Patient declines a pelvic ultrasound screening at this time.  2. Postmenopause Well on no hormone replacement therapy.  No postmenopausal bleeding.  Bone density June 2018 was normal.  Continue vitamin D supplements, calcium intake of 1500 mg daily and regular weightbearing physical activities.  Repeat bone density at 5 years.  3. Class 1 obesity due to excess calories with serious comorbidity and body mass index (BMI) of 32.0 to 32.9 in adult Recommend a lower calorie/carb diet such as [employer].  Aerobic activities 5 times a week  and light weightlifting every 2 days.  Genia Del MD, 9:13 AM 06/18/2020

## 2020-06-23 LAB — PAP IG W/ RFLX HPV ASCU

## 2020-10-28 ENCOUNTER — Other Ambulatory Visit: Payer: Self-pay | Admitting: *Deleted

## 2020-10-28 MED ORDER — LISINOPRIL-HYDROCHLOROTHIAZIDE 20-12.5 MG PO TABS: 1.0000 | ORAL_TABLET | Freq: Every day | ORAL | 1 refills | Status: DC

## 2020-12-02 ENCOUNTER — Telehealth (INDEPENDENT_AMBULATORY_CARE_PROVIDER_SITE_OTHER): Payer: PRIVATE HEALTH INSURANCE | Admitting: Family

## 2020-12-02 DIAGNOSIS — M25562 Pain in left knee: Secondary | ICD-10-CM | POA: Diagnosis not present

## 2020-12-02 DIAGNOSIS — N390 Urinary tract infection, site not specified: Secondary | ICD-10-CM

## 2020-12-02 MED ORDER — SULFAMETHOXAZOLE-TRIMETHOPRIM 800-160 MG PO TABS: 1.0000 | ORAL_TABLET | Freq: Two times a day (BID) | ORAL | 0 refills | Status: DC

## 2020-12-02 NOTE — Progress Notes (Signed)
Connie Wilson is a 65 y.o. female with the following history as recorded in EpicCare:  Patient Active Problem List   Diagnosis Date Noted  . Sinus pressure 05/22/2019  . Low back pain 01/04/2018  . Insomnia 10/19/2016  . Dizziness 10/19/2016  . Recurrent Clostridium difficile diarrhea 11/12/2015  . Elevated glucose 09/29/2012  . HTN (hypertension) 01/06/2012  . Hyperlipidemia 01/06/2012    Current Outpatient Medications  Medication Sig Dispense Refill  . sulfamethoxazole-trimethoprim (BACTRIM DS) 800-160 MG tablet Take 1 tablet by mouth 2 (two) times daily. 10 tablet 0  . Ascorbic Acid (VITAMIN C) 1000 MG tablet Take 1,000 mg by mouth daily.    . calcium citrate (CALCITRATE - DOSED IN MG ELEMENTAL CALCIUM) 950 MG tablet Take 1 tablet by mouth 2 (two) times daily.    . citalopram (CELEXA) 20 MG tablet Take 1 tablet (20 mg total) by mouth daily. 90 tablet 3  . cyclobenzaprine (FLEXERIL) 5 MG tablet Take 1 tablet (5 mg total) by mouth daily as needed for muscle spasms. 7 tablet 0  . fluticasone (FLONASE) 50 MCG/ACT nasal spray Place 2 sprays into both nostrils daily. 16 g 6  . lisinopril-hydrochlorothiazide (ZESTORETIC) 20-12.5 MG tablet Take 1 tablet by mouth daily. 90 tablet 1  . Multiple Vitamin (MULTIVITAMIN) tablet Take 1 tablet by mouth daily.    . Omega-3 Fatty Acids (FISH OIL) 1000 MG CAPS Take by mouth.     No current facility-administered medications for this visit.    Allergies: Clindamycin/lincomycin and Penicillins  Past Medical History:  Diagnosis Date  . Allergy   . Anxiety   . Arthritis   . Cataract    Bilateral eyes, has not needed surgery yet  . Depression   . Fracture of elbow    right  . Hyperlipidemia   . Hypertension     Past Surgical History:  Procedure Laterality Date  . FRACTURE SURGERY    . fx. R ankle    . TUBAL LIGATION      Family History  Problem Relation Age of Onset  . Ovarian cancer Mother   . Cancer - Ovarian Mother   . Heart failure  Father   . Colon cancer Father   . Hypertension Father   . Prostate cancer Father   . Hypertension Sister   . Hyperlipidemia Sister   . Cancer Sister        unknown type- bladder, liver and lungs  . Heart attack Maternal Grandfather   . Diabetes Paternal Grandmother   . Stomach cancer Neg Hx   . Breast cancer Neg Hx     Social History   Tobacco Use  . Smoking status: Former Smoker    Quit date: 03/08/1982    Years since quitting: 38.7  . Smokeless tobacco: Never Used  Substance Use Topics  . Alcohol use: No    Subjective:   I connected with Rodrigo Ran on 12/02/20 at  8:40 AM EST by a telephone call and verified that I am speaking with the correct person using two identifiers.   I discussed the limitations of evaluation and management by telemedicine and the availability of in person appointments. The patient expressed understanding and agreed to proceed. Provider in office/ patient is at home; provider and patient are only 2 people on telephone call.   3-4 day history of burning with urination/ pressure at end of stream; no fever or blood in urine;   In reviewing notes, she has been seeing a provider at [facility] for  primary care needs since 03/2020;  She also mentions continuing problems with knee and left heel pain which she was seeing [facility] provider and PT for as well;   Objective:  There were no vitals filed for this visit.   Lungs: Respirations unlabored;  Neurologic: Alert and oriented; speech intact;   Assessment:  1. Urinary tract infection without hematuria, site unspecified   2. Acute pain of left knee     Plan:  1. Rx for Bactrim DS bid x 5 days; if no improvement in 48 hours, will have patient drop off urine sample to check culture; 2. Patient is encouraged to either go back to her [facility] provider or schedule follow-up with sports medicine provider at our office.  Patient is aware that I am leaving [facility] at the end of April and will need to find a  new PCP or stay with her PCP at [facility] as she does not want to transfer to [facility] location.  Time spent 11 minutes  No follow-ups on file.  No orders of the defined types were placed in this encounter.   Requested Prescriptions   Signed Prescriptions Disp Refills  . sulfamethoxazole-trimethoprim (BACTRIM DS) 800-160 MG tablet 10 tablet 0    Sig: Take 1 tablet by mouth 2 (two) times daily.

## 2020-12-03 ENCOUNTER — Telehealth: Payer: Self-pay | Admitting: Family

## 2020-12-03 DIAGNOSIS — R300 Dysuria: Secondary | ICD-10-CM

## 2020-12-03 NOTE — Telephone Encounter (Signed)
   Patient calling to report sulfamethoxazole-trimethoprim (BACTRIM DS) 800-160 MG tablet is causing nausea.   Please advise

## 2020-12-04 MED ORDER — NITROFURANTOIN MONOHYD MACRO 100 MG PO CAPS: 100.0000 mg | ORAL_CAPSULE | Freq: Two times a day (BID) | ORAL | 0 refills | Status: DC

## 2020-12-04 NOTE — Telephone Encounter (Signed)
Stop the Bactrim- will change to Macrobid for her;

## 2020-12-04 NOTE — Telephone Encounter (Signed)
Pt notified of medication change.  States she will pick it up tomorrow.

## 2020-12-08 NOTE — Telephone Encounter (Signed)
Pt states that Macrobid is causing n/v.  She is eating prior to taking the medication. Also wanted to let you know that she has a bone spur on her right heel & is taking prednisone for it.

## 2020-12-08 NOTE — Telephone Encounter (Signed)
Patient called and said that Macrobid is also causing nausea

## 2020-12-08 NOTE — Telephone Encounter (Signed)
Please stop the medication; let's have her drop off a sample and we will culture it to see if she even needs to be on antibiotics at this point.  She can go to [place] and drop off sample at her convenience. Order in place.

## 2020-12-08 NOTE — Addendum Note (Signed)
Addended by: Eustace Moore on: 12/08/2020 12:35 PM   Modules accepted: Orders

## 2020-12-09 ENCOUNTER — Other Ambulatory Visit: Payer: PRIVATE HEALTH INSURANCE

## 2020-12-09 DIAGNOSIS — R300 Dysuria: Secondary | ICD-10-CM

## 2020-12-09 NOTE — Telephone Encounter (Signed)
Pt given Laura's response by Woodroe Chen.

## 2020-12-11 LAB — URINE CULTURE
MICRO NUMBER:: 11597903
Result:: NO GROWTH
SPECIMEN QUALITY:: ADEQUATE

## 2021-01-08 ENCOUNTER — Other Ambulatory Visit: Payer: Self-pay | Admitting: Family

## 2021-01-08 ENCOUNTER — Other Ambulatory Visit (INDEPENDENT_AMBULATORY_CARE_PROVIDER_SITE_OTHER): Payer: PRIVATE HEALTH INSURANCE

## 2021-01-08 ENCOUNTER — Telehealth (INDEPENDENT_AMBULATORY_CARE_PROVIDER_SITE_OTHER): Payer: PRIVATE HEALTH INSURANCE | Admitting: Family

## 2021-01-08 ENCOUNTER — Telehealth: Payer: Self-pay | Admitting: Family

## 2021-01-08 DIAGNOSIS — R748 Abnormal levels of other serum enzymes: Secondary | ICD-10-CM

## 2021-01-08 DIAGNOSIS — R197 Diarrhea, unspecified: Secondary | ICD-10-CM

## 2021-01-08 DIAGNOSIS — R1030 Lower abdominal pain, unspecified: Secondary | ICD-10-CM

## 2021-01-08 DIAGNOSIS — F339 Major depressive disorder, recurrent, unspecified: Secondary | ICD-10-CM

## 2021-01-08 DIAGNOSIS — R1032 Left lower quadrant pain: Secondary | ICD-10-CM

## 2021-01-08 LAB — CBC WITH DIFFERENTIAL/PLATELET
Basophils Absolute: 0.1 K/uL (ref 0.0–0.1)
Basophils Relative: 0.6 % (ref 0.0–3.0)
Eosinophils Absolute: 0.1 K/uL (ref 0.0–0.7)
Eosinophils Relative: 0.9 % (ref 0.0–5.0)
HCT: 39.9 % (ref 36.0–46.0)
Hemoglobin: 13.3 g/dL (ref 12.0–15.0)
Lymphocytes Relative: 26.1 % (ref 12.0–46.0)
Lymphs Abs: 2.9 K/uL (ref 0.7–4.0)
MCHC: 33.4 g/dL (ref 30.0–36.0)
MCV: 85.4 fl (ref 78.0–100.0)
Monocytes Absolute: 0.7 K/uL (ref 0.1–1.0)
Monocytes Relative: 6.2 % (ref 3.0–12.0)
Neutro Abs: 7.5 K/uL (ref 1.4–7.7)
Neutrophils Relative %: 66.2 % (ref 43.0–77.0)
Platelets: 273 K/uL (ref 150.0–400.0)
RBC: 4.68 Mil/uL (ref 3.87–5.11)
RDW: 14.4 % (ref 11.5–15.5)
WBC: 11.2 K/uL — ABNORMAL HIGH (ref 4.0–10.5)

## 2021-01-08 LAB — COMPREHENSIVE METABOLIC PANEL
ALT: 17 U/L (ref 0–35)
AST: 14 U/L (ref 0–37)
Albumin: 3.9 g/dL (ref 3.5–5.2)
Alkaline Phosphatase: 73 U/L (ref 39–117)
BUN: 16 mg/dL (ref 6–23)
CO2: 28 mEq/L (ref 19–32)
Calcium: 9.2 mg/dL (ref 8.4–10.5)
Chloride: 102 mEq/L (ref 96–112)
Creatinine, Ser: 0.87 mg/dL (ref 0.40–1.20)
GFR: 70.15 mL/min (ref >60.00)
Glucose, Bld: 103 mg/dL — ABNORMAL HIGH (ref 70–99)
Potassium: 3.8 mEq/L (ref 3.5–5.1)
Sodium: 138 mEq/L (ref 135–145)
Total Bilirubin: 0.7 mg/dL (ref 0.2–1.2)
Total Protein: 7.2 g/dL (ref 6.0–8.3)

## 2021-01-08 LAB — AMYLASE: Amylase: 60 U/L (ref 27–131)

## 2021-01-08 LAB — LIPASE: Lipase: 91 U/L — ABNORMAL HIGH (ref 11.0–59.0)

## 2021-01-08 MED ORDER — LISINOPRIL-HYDROCHLOROTHIAZIDE 20-12.5 MG PO TABS: 1.0000 | ORAL_TABLET | Freq: Every day | ORAL | 0 refills | Status: DC

## 2021-01-08 MED ORDER — CITALOPRAM HYDROBROMIDE 20 MG PO TABS: 20.0000 mg | ORAL_TABLET | Freq: Every day | ORAL | 0 refills | Status: DC

## 2021-01-08 NOTE — Progress Notes (Signed)
Noted. Provider spoke with pt.

## 2021-01-08 NOTE — Telephone Encounter (Signed)
Patient is planning to transfer her primary care needs to [facility] clinic that she has been in the past 4 months at [facility]; will give her refills on Celexa and Lisinopril HCT x 2 months until she can be seen with new PCP. Patient in agreement.

## 2021-01-08 NOTE — Progress Notes (Addendum)
Connie Wilson is a 65 y.o. female with the following history as recorded in EpicCare:  Patient Active Problem List   Diagnosis Date Noted  . Sinus pressure 05/22/2019  . Low back pain 01/04/2018  . Insomnia 10/19/2016  . Dizziness 10/19/2016  . Recurrent Clostridium difficile diarrhea 11/12/2015  . Elevated glucose 09/29/2012  . HTN (hypertension) 01/06/2012  . Hyperlipidemia 01/06/2012    Current Outpatient Medications  Medication Sig Dispense Refill  . Ascorbic Acid (VITAMIN C) 1000 MG tablet Take 1,000 mg by mouth daily.    . calcium citrate (CALCITRATE - DOSED IN MG ELEMENTAL CALCIUM) 950 MG tablet Take 1 tablet by mouth 2 (two) times daily.    . citalopram (CELEXA) 20 MG tablet Take 1 tablet (20 mg total) by mouth daily. 90 tablet 3  . cyclobenzaprine (FLEXERIL) 5 MG tablet Take 1 tablet (5 mg total) by mouth daily as needed for muscle spasms. 7 tablet 0  . fluticasone (FLONASE) 50 MCG/ACT nasal spray Place 2 sprays into both nostrils daily. 16 g 6  . lisinopril-hydrochlorothiazide (ZESTORETIC) 20-12.5 MG tablet Take 1 tablet by mouth daily. 90 tablet 1  . Multiple Vitamin (MULTIVITAMIN) tablet Take 1 tablet by mouth daily.    . Omega-3 Fatty Acids (FISH OIL) 1000 MG CAPS Take by mouth.     No current facility-administered medications for this visit.    Allergies: Clindamycin/lincomycin and Penicillins  Past Medical History:  Diagnosis Date  . Allergy   . Anxiety   . Arthritis   . Cataract    Bilateral eyes, has not needed surgery yet  . Depression   . Fracture of elbow    right  . Hyperlipidemia   . Hypertension     Past Surgical History:  Procedure Laterality Date  . FRACTURE SURGERY    . fx. R ankle    . TUBAL LIGATION      Family History  Problem Relation Age of Onset  . Ovarian cancer Mother   . Cancer - Ovarian Mother   . Heart failure Father   . Colon cancer Father   . Hypertension Father   . Prostate cancer Father   . Hypertension Sister   .  Hyperlipidemia Sister   . Cancer Sister        unknown type- bladder, liver and lungs  . Heart attack Maternal Grandfather   . Diabetes Paternal Grandmother   . Stomach cancer Neg Hx   . Breast cancer Neg Hx     Social History   Tobacco Use  . Smoking status: Former Smoker    Quit date: 03/08/1982    Years since quitting: 38.8  . Smokeless tobacco: Never Used  Substance Use Topics  . Alcohol use: No    Subjective:   I connected with Thurston Pounds on 01/08/21 at  8:40 AM EDT by a telephone call and verified that I am speaking with the correct person using two identifiers.   I discussed the limitations of evaluation and management by telemedicine and the availability of in person appointments. The patient expressed understanding and agreed to proceed. Provider in office/ patient is at home; provider and patient are only 2 people on telephone call.    Complaining of 1 week history of lower abdominal pain/ diarrhea after eating; does not feel that symptoms are worsening- but are persisting; notes that symptoms have occurred after eating only; is up to date on colonoscopy- last done in 2018- repeat due in 2023; no diverticulosis noted on that exam; no  known history of diverticulitis; has had C. Diff in the past- took antibiotics for suspected UTI last month; denies any fever, nausea or vomiting; no dark or tarry colored stool; denies any blood in diarrhea; does still have her gallbladder;    Objective:  There were no vitals filed for this visit.  Lungs: Respirations unlabored;  Neurologic: Alert and oriented; speech intact;   Assessment:  1. Diarrhea, unspecified type   2. Lower abdominal pain     Plan:  Limited information offered by patient and treatment complicated by option to do as a phone call; will go ahead and have patient go to [place] office this am to get labs done including CBC, CMP, amylase, lipase, stool study; will also need to consider abdominal/ pelvic CT but unlikely  this can be scheduled today; follow-up to be determined;  Time spent 12 minutes  No follow-ups on file.  Orders Placed This Encounter  Procedures  . CBC with Differential/Platelet    Standing Status:   Future    Standing Expiration Date:   01/08/2022  . Comp Met (CMET)    Standing Status:   Future    Standing Expiration Date:   01/08/2022  . Amylase    Standing Status:   Future    Standing Expiration Date:   01/08/2022  . Lipase    Standing Status:   Future    Standing Expiration Date:   01/08/2022  . Gastrointestinal Pathogen Panel PCR    Standing Status:   Future    Standing Expiration Date:   01/08/2022    Requested Prescriptions    No prescriptions requested or ordered in this encounter

## 2021-01-08 NOTE — Progress Notes (Signed)
Reviewed labs with patient; she was eating at time of phone call so would not be able to get outpatient CT scheduled for this afternoon; her schedule does not easily allow for her to go this afternoon either. Strict ER precautions for upcoming weekend but since symptoms have been steady x 1 week, do not feel she has to go to ER right now; Will schedule for STAT CT and patient is agreeable to having this done on Monday am; follow up to be determined;

## 2021-01-11 ENCOUNTER — Telehealth: Payer: Self-pay | Admitting: Family

## 2021-01-11 DIAGNOSIS — R197 Diarrhea, unspecified: Secondary | ICD-10-CM

## 2021-01-11 DIAGNOSIS — R1032 Left lower quadrant pain: Secondary | ICD-10-CM

## 2021-01-11 DIAGNOSIS — R748 Abnormal levels of other serum enzymes: Secondary | ICD-10-CM

## 2021-01-11 NOTE — Telephone Encounter (Signed)
I scheduled pt this morning with [facility] and pt is aware of appointment. Previous order has already been faxed to [facility] so I will cancel the new order you placed.   Thanks

## 2021-01-11 NOTE — Telephone Encounter (Signed)
Okay thanks so much for the update.

## 2021-01-11 NOTE — Telephone Encounter (Signed)
Patient called and said that she wants to hold off on the referral for GI. She said that she will sometimes eat something and not wrap it when putting it back up. She said that she thinks she contracting some bacteria. She said that she will do better and call us back.

## 2021-01-11 NOTE — Telephone Encounter (Signed)
FYI to provider

## 2021-01-11 NOTE — Telephone Encounter (Signed)
I have reordered CT to [facility].   Please advise on if there is anything else I need to do.

## 2021-01-11 NOTE — Telephone Encounter (Signed)
I have called the pt and relayed the message from the provider and she stated understanding.

## 2021-01-11 NOTE — Telephone Encounter (Signed)
Please let her know that CT did not show any explanation for diarrhea and that pancreas was unremarkable. I am going to refer her to GI for further evaluation.

## 2021-01-11 NOTE — Telephone Encounter (Signed)
Patient has been scheduled for a CT scan today at [facility] but the order was sent to [facility] imaging instead.  Helmut Muster from [facility] imaging has called to let us know of that mistake

## 2021-07-08 ENCOUNTER — Other Ambulatory Visit: Payer: Self-pay

## 2021-07-08 ENCOUNTER — Encounter: Payer: Self-pay | Admitting: Nurse Practitioner

## 2021-07-08 ENCOUNTER — Ambulatory Visit (INDEPENDENT_AMBULATORY_CARE_PROVIDER_SITE_OTHER): Payer: PPO | Admitting: Nurse Practitioner

## 2021-07-08 VITALS — BP 126/80 | HR 80 | Resp 18 | Ht 67.5 in | Wt 222.8 lb

## 2021-07-08 DIAGNOSIS — F339 Major depressive disorder, recurrent, unspecified: Secondary | ICD-10-CM | POA: Diagnosis not present

## 2021-07-08 DIAGNOSIS — M1711 Unilateral primary osteoarthritis, right knee: Secondary | ICD-10-CM

## 2021-07-08 DIAGNOSIS — S61412A Laceration without foreign body of left hand, initial encounter: Secondary | ICD-10-CM

## 2021-07-08 DIAGNOSIS — R6889 Other general symptoms and signs: Secondary | ICD-10-CM | POA: Diagnosis not present

## 2021-07-08 MED ORDER — CITALOPRAM HYDROBROMIDE 20 MG PO TABS: 20.0000 mg | ORAL_TABLET | Freq: Every day | ORAL | 0 refills | Status: AC

## 2021-07-08 NOTE — Progress Notes (Signed)
Subjective:  Patient ID: Dawanda Mapel, female    DOB: 1955-12-26  Age: 65 y.o. MRN: [id number]  CC:  Chief Complaint  Patient presents with   Transfer of care    Cut to her left hand that happened last night. She would like to go [facility] for knee.       HPI  This patient arrives today for the above.  She is transferring care to myself today.  She was seeing another provider in this office before that provider moved to another facility.  Overall she feels well, but she did mention that she cut her left hand yesterday want me to look at it.  She is also experiencing right knee pain which appears to be chronic in nature and tells me she had a injection in the past which really helped her pain.  She is wondering if she can be referred back to [facility] for treatment again.    She is requesting a refill on her citalopram.  She is concerned because she feels like she is having some memory issues.  She tells me she is stressed and very busy and thinks it may be related to this but wanted to be evaluated for that as well.  She does me she had her last tetanus shot within the last year, per chart review I see last tetanus shot was documented as being administered on 01/08/2020.   Past Medical History:  Diagnosis Date   Allergy    Anxiety    Arthritis    Cataract    Bilateral eyes, has not needed surgery yet   Depression    Fracture of elbow    right   Hyperlipidemia    Hypertension       Family History  Problem Relation Age of Onset   Ovarian cancer Mother    Cancer - Ovarian Mother    Heart failure Father    Colon cancer Father    Hypertension Father    Prostate cancer Father    Hypertension Sister    Hyperlipidemia Sister    Cancer Sister        unknown type- bladder, liver and lungs   Heart attack Maternal Grandfather    Diabetes Paternal Grandmother    Stomach cancer Neg Hx    Breast cancer Neg Hx     Social History   Social History Narrative    Not on file   Social History   Tobacco Use   Smoking status: Former    Types: Cigarettes    Quit date: 03/08/1982    Years since quitting: 39.3   Smokeless tobacco: Never  Substance Use Topics   Alcohol use: No     Current Meds  Medication Sig   Ascorbic Acid (VITAMIN C) 1000 MG tablet Take 1,000 mg by mouth daily.   calcium citrate (CALCITRATE - DOSED IN MG ELEMENTAL CALCIUM) 950 MG tablet Take 1 tablet by mouth 2 (two) times daily.   cyclobenzaprine (FLEXERIL) 5 MG tablet Take 1 tablet (5 mg total) by mouth daily as needed for muscle spasms.   fluticasone (FLONASE) 50 MCG/ACT nasal spray Place 2 sprays into both nostrils daily.   Multiple Vitamin (MULTIVITAMIN) tablet Take 1 tablet by mouth daily.   Omega-3 Fatty Acids (FISH OIL) 1000 MG CAPS Take by mouth.   [DISCONTINUED] citalopram (CELEXA) 20 MG tablet Take 1 tablet (20 mg total) by mouth daily.    ROS:  See HPI   Objective:   Today's  Vitals: BP 126/80   Pulse 80   Resp 18   Ht 5' 7.5" (1.715 m)   Wt 222 lb 12.8 oz (101.1 kg)   SpO2 99%   BMI 34.38 kg/m  Vitals with BMI 07/08/2021 06/18/2020 04/13/2020  Height 5' 7.5" 5' 7.5" 5\' 9"   Weight 222 lbs 13 oz 211 lbs 224 lbs  BMI 34.36 32.54 33.06  Systolic 126 138  Diastolic 80 86 102  Pulse 80 - 76     Physical Exam Vitals reviewed.  Constitutional:      General: She is not in acute distress.    Appearance: Normal appearance.  HENT:     Head: Normocephalic and atraumatic.  Neck:     Vascular: No carotid bruit.  Cardiovascular:     Rate and Rhythm: Normal rate and regular rhythm.     Pulses: Normal pulses.     Heart sounds: Normal heart sounds.  Pulmonary:     Effort: Pulmonary effort is normal.     Breath sounds: Normal breath sounds.  Skin:    General: Skin is warm and dry.          Comments: Laceration noted.  Wound edges are well approximated, hemostasis has been achieved.  No swelling, no redness, no drainage, no warmth to touch, not tender to  touch.  Neurological:     General: No focal deficit present.     Mental Status: She is alert and oriented to person, place, and time.  Psychiatric:        Mood and Affect: Mood normal.        Behavior: Behavior normal.        Judgment: Judgment normal.      6CIT Screen 07/08/2021  What Year? 0 points  What month? 0 points  What time? 0 points  Count back from 20 0 points  Months in reverse 0 points  Repeat phrase 2 points  Total Score 2       Assessment and Plan   1. Arthritis of right knee   2. Episode of recurrent major depressive disorder, unspecified depression episode severity (HCC)   3. Laceration of left hand without foreign body, initial encounter   4. Forgetfulness      Plan: 1.  Referral to 07/10/2021 made per patient's request. 2.  Refill of citalopram ordered today. 3.  Wound appears not infected, it looks to be healing well. I recommended she keep it wrapped and dry as much as possible. I also recommended changing her dressing daily and using triple ointment antibiotic cream. She denies any known regular exposures to MRSA. She was educated on signs and symptoms of infection and to let me know if she experiences any of these. If this occurs will treat with antibiotics. She is up to date with tetanus shot.  4.  6CIT was low risk for dementia. I think her forgetfulness is related to stress, but may consider referral to neuro in the future if symptom worsens or persists.    Tests ordered Orders Placed This Encounter  Procedures   Ambulatory referral to Orthopedic Surgery      Meds ordered this encounter  Medications   citalopram (CELEXA) 20 MG tablet    Sig: Take 1 tablet (20 mg total) by mouth daily.    Dispense:  90 tablet    Refill:  0    Order Specific Question:   Supervising Provider    Answer:   Dewaine Conger Pincus Sanes    Patient  to follow-up in 3 months for CPE  Elenore Paddy, NP

## 2021-07-08 NOTE — Patient Instructions (Signed)
Call this office if you do not hear from [facility] within 1 week to get an appointment scheduled.

## 2021-10-14 ENCOUNTER — Ambulatory Visit: Payer: PRIVATE HEALTH INSURANCE | Admitting: Nurse Practitioner

## 2021-11-10 ENCOUNTER — Emergency Department (HOSPITAL_COMMUNITY): Payer: Medicare Other

## 2021-11-10 ENCOUNTER — Encounter (HOSPITAL_COMMUNITY): Payer: Self-pay

## 2021-11-10 ENCOUNTER — Emergency Department (HOSPITAL_COMMUNITY)
Admission: EM | Admit: 2021-11-10 | Discharge: 2021-11-10 | Disposition: A | Discharge status: Home / Self Care | Payer: Medicare Other | Attending: Emergency Medicine | Admitting: Emergency Medicine

## 2021-11-10 DIAGNOSIS — I493 Ventricular premature depolarization: Secondary | ICD-10-CM | POA: Insufficient documentation

## 2021-11-10 DIAGNOSIS — R0602 Shortness of breath: Secondary | ICD-10-CM | POA: Diagnosis present

## 2021-11-10 DIAGNOSIS — I10 Essential (primary) hypertension: Secondary | ICD-10-CM | POA: Diagnosis not present

## 2021-11-10 DIAGNOSIS — R7989 Other specified abnormal findings of blood chemistry: Secondary | ICD-10-CM | POA: Diagnosis not present

## 2021-11-10 LAB — CBC WITH DIFFERENTIAL/PLATELET
Abs Immature Granulocytes: 0.03 10*3/uL (ref 0.00–0.07)
Basophils Absolute: 0.1 10*3/uL (ref 0.0–0.1)
Basophils Relative: 1 %
Eosinophils Absolute: 0.1 10*3/uL (ref 0.0–0.5)
Eosinophils Relative: 1 %
HCT: 39.8 % (ref 36.0–46.0)
Hemoglobin: 13.1 g/dL (ref 12.0–15.0)
Immature Granulocytes: 0 %
Lymphocytes Relative: 25 %
Lymphs Abs: 2.1 10*3/uL (ref 0.7–4.0)
MCH: 28.2 pg (ref 26.0–34.0)
MCHC: 32.9 g/dL (ref 30.0–36.0)
MCV: 85.6 fL (ref 80.0–100.0)
Monocytes Absolute: 0.4 10*3/uL (ref 0.1–1.0)
Monocytes Relative: 4 %
Neutro Abs: 5.7 10*3/uL (ref 1.7–7.7)
Neutrophils Relative %: 69 %
Platelets: 234 10*3/uL (ref 150–400)
RBC: 4.65 MIL/uL (ref 3.87–5.11)
RDW: 14.8 % (ref 11.5–15.5)
WBC: 8.3 10*3/uL (ref 4.0–10.5)
nRBC: 0 % (ref 0.0–0.2)

## 2021-11-10 LAB — BASIC METABOLIC PANEL
Anion gap: 7 (ref 5–15)
BUN: 10 mg/dL (ref 8–23)
CO2: 28 mmol/L (ref 22–32)
Calcium: 9.1 mg/dL (ref 8.9–10.3)
Chloride: 105 mmol/L (ref 98–111)
Creatinine, Ser: 0.82 mg/dL (ref 0.44–1.00)
GFR, Estimated: >60 mL/min (ref >60)
Glucose, Bld: 169 mg/dL — ABNORMAL HIGH (ref 70–99)
Potassium: 3.4 mmol/L — ABNORMAL LOW (ref 3.5–5.1)
Sodium: 140 mmol/L (ref 135–145)

## 2021-11-10 LAB — MAGNESIUM: Magnesium: 1.8 mg/dL (ref 1.7–2.4)

## 2021-11-10 LAB — BRAIN NATRIURETIC PEPTIDE: B Natriuretic Peptide: 150.7 pg/mL — ABNORMAL HIGH (ref 0.0–100.0)

## 2021-11-10 LAB — PHOSPHORUS: Phosphorus: 2.8 mg/dL (ref 2.5–4.6)

## 2021-11-10 NOTE — ED Provider Notes (Signed)
[facility] DEPT Provider Note   CSN: [id number] Arrival date & time: 11/10/21  1028     History  Chief Complaint  Patient presents with   Shortness of Breath    Connie Wilson is a 66 y.o. female.  HPI     66 year old female comes in with chief complaint of shortness of breath into 1 day. She has history of hypertension and hyperlipidemia.  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  Her symptoms will improve when she rests.  She went home and immediately went to sleep.  She woke up this morning around 5 AM and noticed similar shortness of breath when she was doing simple activity.  When she exerts herself further she gets dizzy.  She has no associated chest pain.  She has intermittently felt some palpitations.  [facility] and was advised to come to the ER for further evaluation.  Pt has no hx of PE, DVT and denies any exogenous hormone (testosterone / estrogen) use, long distance travels or surgery in the past 6 weeks, active cancer, recent immobilization.  Patient denies any heavy smoking or drinking.  She denies any orthopnea, PND-like symptoms.  She does not have any underlying lung disease.  Home Medications Prior to Admission medications   Medication Sig Start Date End Date Taking? Authorizing Provider  Ascorbic Acid (VITAMIN C) 1000 MG tablet Take 1,000 mg by mouth daily.    [provider]  calcium citrate (CALCITRATE - DOSED IN MG ELEMENTAL CALCIUM) 950 MG tablet Take 1 tablet by mouth 2 (two) times daily.    [provider]  citalopram (CELEXA) 20 MG tablet Take 1 tablet (20 mg total) by mouth daily. 07/08/21   Ailene Ards, NP  cyclobenzaprine (FLEXERIL) 5 MG tablet Take 1 tablet (5 mg total) by mouth daily as needed for muscle spasms. 04/13/20   Robinson, [place] N, PA-C  fluticasone (FLONASE) 50 MCG/ACT nasal spray  Place 2 sprays into both nostrils daily. 08/02/18   Marrian Salvage, FNP  Multiple Vitamin (MULTIVITAMIN) tablet Take 1 tablet by mouth daily.    [provider]  Omega-3 Fatty Acids (FISH OIL) 1000 MG CAPS Take by mouth.    [provider]      Allergies    Clindamycin/lincomycin and Penicillins    Review of Systems   Review of Systems  Respiratory:  Positive for shortness of breath. Negative for cough.   Cardiovascular:  Negative for chest pain.  All other systems reviewed and are negative.  Physical Exam Updated Vital Signs BP 133/76    Pulse 72    Temp 98.8 F (37.1 C) (Oral)    Resp 20    Ht 5\' 7"  (1.702 m)    Wt 98 kg    SpO2 98%    BMI 33.83 kg/m  Physical Exam Vitals and nursing note reviewed.  Constitutional:      Appearance: She is well-developed.  HENT:     Head: Atraumatic.  Cardiovascular:     Rate and Rhythm: Normal rate.  Pulmonary:     Effort: Pulmonary effort is normal.     Breath sounds: No decreased breath sounds, wheezing, rhonchi or rales.  Musculoskeletal:     Cervical back: Normal range of motion and neck supple.     Right lower leg: No edema.     Left lower leg: No edema.  Skin:  General: Skin is warm and dry.  Neurological:     Mental Status: She is alert and oriented to person, place, and time.    ED Results / Procedures / Treatments   Labs (all labs ordered are listed, but only abnormal results are displayed) Labs Reviewed  BASIC METABOLIC PANEL - Abnormal; Notable for the following components:      Result Value   Potassium 3.4 (*)    Glucose, Bld 169 (*)    All other components within normal limits  BRAIN NATRIURETIC PEPTIDE - Abnormal; Notable for the following components:   B Natriuretic Peptide 150.7 (*)    All other components within normal limits  CBC WITH DIFFERENTIAL/PLATELET  MAGNESIUM  PHOSPHORUS    EKG EKG Interpretation  Date/Time:  Wednesday November 10 2021 10:48:33 EST Ventricular Rate:   79 PR Interval:  164 QRS Duration: 94 QT Interval:  396 QTC Calculation: 454 R Axis:   27 Text Interpretation: Sinus rhythm Ventricular premature complex Borderline repolarization abnormality No acute changes No significant change since last tracing Confirmed by Varney Biles [phone]) on 11/10/2021 12:13:16 PM  Radiology DG Chest 2 View  Result Date: 11/10/2021 CLINICAL DATA:  Shortness of breath EXAM: CHEST - 2 VIEW COMPARISON:  2017 FINDINGS: The heart size and mediastinal contours are within normal limits. Both lungs are clear. No pleural effusion or pneumothorax. The visualized skeletal structures are unremarkable. IMPRESSION: No active cardiopulmonary disease. Electronically Signed   By: Macy Mis M.D.   On: 11/10/2021 11:18    Procedures Procedures    Medications Ordered in ED Medications - No data to display  ED Course/ Medical Decision Making/ A&P Clinical Course as of 11/10/21 1407  Wed Nov 10, 2021  1406 Patient continues to have multiple PVCs on our telemetry monitoring.  BNP is slightly elevated at 150.  Electrolytes are normal.  CBC reveals no anemia.  I spoke with Dr. [place].  He indicates that patient does not have a significant room for [place] to start beta-blocker right now, he wants patient to call the clinic for rapid appointment.  Patient is stable for discharge. Results discussed with her.  Advised her to call cardiology service as soon as she leaves the ER.  Strict ER return precautions have been discussed, and patient is agreeing with the plan and is comfortable with the workup done and the recommendations from the ER. [AN]    Clinical Course User Index [AN] Varney Biles, MD                           Medical Decision Making Amount and/or Complexity of Data Reviewed Labs: ordered. Radiology: ordered.   This patient presents to the ED with chief complaint(s) of palpitations and shortness of breath with pertinent past medical history of hypertension,  hyperlipidemia. The complaint involves an extensive differential diagnosis and treatment options and also carries with it a high risk of complications and morbidity.    The differential diagnosis includes acute CHF, pulmonary edema, SVT, A. fib, COPD, pleural effusion, pulmonary edema.  [facility] which appeared concerning to them.  We have ordered an EKG in the ER along with basic labs.  [facility] indicated PVCs.  ED EKG is reassuring, however during my assessment I noticed that patient was having PVCs.   The initial plan is to get basic labs, chest x-ray and discussed the case with cardiology.  My  suspicion is that she is having symptomatic bigeminy/PVC   Additional history obtained: Records reviewed  urgent care record  Reassessment and review: Lab Tests: I Ordered, and personally interpreted labs.  The pertinent results include: CBC with normal hemoglobin, normal electrolytes.  Imaging Studies ordered: I independently visualized and interpreted the following imaging X-ray of the chest   which showed no evidence of pulmonary edema or pleural effusion. I agree with the radiologist interpretation   Cardiac Monitoring: The patient was maintained on a cardiac monitor.  I personally viewed and interpreted the cardiac monitor which showed an underlying rhythm of:  sinus rhythm and multiple rounds where patient had PVCs   Consultations Obtained: I requested consultation with the consultant cardiology , and discussed  findings as well as pertinent plan - they recommend: Prompt follow-up, no new medications.  Complexity of problems addressed: Patients presentation is most consistent with  acute illness/injury with systemic symptoms During patient's assessment  Disposition: After consideration of the diagnostic results and the patients response to treatment,  I feel that the patent would benefit from discharge with strict ER return  precautions .    Final Clinical Impression(s) / ED Diagnoses Final diagnoses:  PVC's (premature ventricular contractions)    Rx / DC Orders ED Discharge Orders     None         Varney Biles, MD 11/10/21 1409

## 2021-11-10 NOTE — ED Triage Notes (Signed)
Pt arrived via POV, c/o SOB at rest since yesterday. Worsening with exertion.

## 2021-11-10 NOTE — Discharge Instructions (Addendum)
You are seen in the ER for shortness of breath, palpitations.  Work-up in the emergency room is overall reassuring.  We did notice that he had multiple episodes of PVCs, or premature ventricular contractions.  Instructions on that diagnosis is attached for your review.  We recommend that he follow-up with cardiology services soon as possible for further evaluation.  Return to the ER if you start having severe shortness of breath, chest pain, shortness of breath when laying flat, new leg swelling, near fainting or fainting spells.

## 2021-11-15 DIAGNOSIS — I493 Ventricular premature depolarization: Secondary | ICD-10-CM | POA: Insufficient documentation

## 2021-11-15 DIAGNOSIS — R0602 Shortness of breath: Secondary | ICD-10-CM | POA: Insufficient documentation

## 2021-11-15 NOTE — Progress Notes (Signed)
Cardiology Office Note   Date:  11/16/2021   ID:  Connie Wilson, DOB Nov 03, 1955, MRN [id number]  PCP:  Paschal Dopp, PA  Cardiologist:   Rollene Rotunda, MD Referring:  ED  Chief Complaint  Patient presents with   Shortness of Breath      History of Present Illness: Connie Wilson is a 66 y.o. female who presents for evaluation of SOB and palpitations.    Her CXR was normal but she did have a minimally elevated BNP.  She was in the emergency room on February 1 and I reviewed these records for this visit.  She had shortness of breath.  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  She had multiple PVCs on tele.    She reports that she went to urgent care because she was short of breath.  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  She did not really feel her heart racing or skipping although she says now she feels some palpitations.  She states she was short of breath the next day which was when she went to the urgent care.  She is not really describing PND or orthopnea although she says she does not sleep.  Her daughter who was on the phone says she has a lot of anxiety.  The patient is not really describing substernal chest pressure.  There is some vague discomfort.  She is not having neck or arm pain.  She has had no presyncope or syncope though she has dizziness when she turns her head.  She has had difficult to control hypertension and clonidine was just added.  She has not had any new weight gain or edema.   Past Medical History:  Diagnosis Date   Allergy    Anxiety    Arthritis    Cataract    Bilateral eyes, has not needed surgery yet   Depression    Fracture of elbow    right   Hyperlipidemia    Hypertension     Past Surgical History:  Procedure Laterality Date   FRACTURE SURGERY     fx. R ankle     TUBAL LIGATION       Current Outpatient Medications  Medication Sig Dispense Refill   Ascorbic  Acid (VITAMIN C) 1000 MG tablet Take 1,000 mg by mouth daily.     calcium citrate (CALCITRATE - DOSED IN MG ELEMENTAL CALCIUM) 950 MG tablet Take 1 tablet by mouth 2 (two) times daily.     citalopram (CELEXA) 20 MG tablet Take 1 tablet (20 mg total) by mouth daily. 90 tablet 0   cloNIDine (CATAPRES) 0.2 MG tablet Take 0.2 mg by mouth 2 (two) times daily.     fluticasone (FLONASE) 50 MCG/ACT nasal spray Place 2 sprays into both nostrils daily. 16 g 6   lisinopril (ZESTRIL) 20 MG tablet Take 1 tablet (20 mg total) by mouth daily. 30 tablet 4   Multiple Vitamin (MULTIVITAMIN) tablet Take 1 tablet by mouth daily.     Omega-3 Fatty Acids (FISH OIL) 1000 MG CAPS Take by mouth.     cyclobenzaprine (FLEXERIL) 5 MG tablet Take 1 tablet (5 mg total) by mouth daily as needed for muscle spasms. (Patient not taking: Reported on 11/16/2021) 7 tablet 0   No current facility-administered medications for this visit.    Allergies:   Clindamycin/lincomycin and Penicillins    Social  History:  The patient  reports that she quit smoking about 39 years ago. Her smoking use included cigarettes. She has never used smokeless tobacco. She reports that she does not drink alcohol and does not use drugs.   Family History:  The patient's family history includes Cancer in her sister; Cancer - Ovarian in her mother; Colon cancer in her father; Diabetes in her paternal grandmother; Heart attack in her maternal grandfather; Heart failure in her father; Hyperlipidemia in her sister; Hypertension in her father and sister; Ovarian cancer in her mother; Prostate cancer in her father.    ROS:  Please see the history of present illness.   Otherwise, review of systems are positive for leg pain.   All other systems are reviewed and negative.    PHYSICAL EXAM: VS:  BP (!) 150/96    Pulse 76    Ht 5\' 8"  (1.727 m)    Wt 213 lb (96.6 kg)    SpO2 98%    BMI 32.39 kg/m  , BMI Body mass index is 32.39 kg/m. GENERAL:  Well appearing HEENT:   Pupils equal round and reactive, fundi not visualized, oral mucosa unremarkable NECK:  No jugular venous distention, waveform within normal limits, carotid upstroke brisk and symmetric, no bruits, no thyromegaly LYMPHATICS:  No cervical, inguinal adenopathy LUNGS:  Clear to auscultation bilaterally BACK:  No CVA tenderness CHEST:  Unremarkable HEART:  PMI not displaced or sustained,S1 and S2 within normal limits, no S3, no S4, no clicks, no rubs, no murmurs ABD:  Flat, positive bowel sounds normal in frequency in pitch, no bruits, no rebound, no guarding, no midline pulsatile mass, no hepatomegaly, no splenomegaly EXT:  2 plus pulses throughout, no edema, no cyanosis no clubbing SKIN:  No rashes no nodules NEURO:  Cranial nerves II through XII grossly intact, motor grossly intact throughout PSYCH:  Cognitively intact, oriented to person place and time    EKG:  EKG is ordered today. The ekg ordered today demonstrates sinus rhythm, rate 76, axis within normal limits, QTc slightly prolonged, early transition lead V2, nonspecific anterior T wave changes.   Recent Labs: 01/08/2021: ALT 17 11/10/2021: B Natriuretic Peptide 150.7; BUN 10; Creatinine, Ser 0.82; Hemoglobin 13.1; Magnesium 1.8; Platelets 234; Potassium 3.4; Sodium 140    Lipid Panel    Component Value Date/Time   CHOL 271 (H) 03/16/2020 0902   CHOL 275 (H) 02/06/2017 1010   TRIG 274.0 (H) 03/16/2020 0902   HDL 38.60 (L) 03/16/2020 0902   HDL 52 02/06/2017 1010   CHOLHDL 7 03/16/2020 0902   VLDL 54.8 (H) 03/16/2020 0902   LDLCALC 181 (H) 03/05/2019 1011   LDLCALC 193 (H) 02/06/2017 1010   LDLDIRECT 189.0 03/16/2020 0902      Wt Readings from Last 3 Encounters:  11/16/21 213 lb (96.6 kg)  11/10/21 216 lb (98 kg)  07/08/21 222 lb 12.8 oz (101.1 kg)      Other studies Reviewed: Additional studies/ records that were reviewed today include: ED records. Review of the above records demonstrates:  Please see elsewhere in  the note.     ASSESSMENT AND PLAN:  SOB: I am going to start with a POET (Plain Old Exercise Treadmill) to screen for obstructive coronary disease and an echocardiogram.  Further management will be based on these results.  PVCs: I will check a TSH.  Other electrolytes were okay.  Further evaluation will be pending the results of the above although I do not think she needs long-term monitoring  if she is not feeling these and she has normal studies otherwise.    HTN: Her blood pressure is elevated but she says this is unusual.  She will keep a blood pressure diary.  Current medicines are reviewed at length with the patient today.  The patient does not have concerns regarding medicines.  The following changes have been made:  no change  Labs/ tests ordered today include:   Orders Placed This Encounter  Procedures   TSH   EXERCISE TOLERANCE TEST (ETT)   EKG 12-Lead   ECHOCARDIOGRAM COMPLETE     Disposition:   FU with me     Signed, Rollene Rotunda, MD  11/16/2021 4:50 PM    [facility]

## 2021-11-16 ENCOUNTER — Ambulatory Visit (INDEPENDENT_AMBULATORY_CARE_PROVIDER_SITE_OTHER): Payer: PRIVATE HEALTH INSURANCE | Admitting: Cardiology

## 2021-11-16 ENCOUNTER — Other Ambulatory Visit: Payer: Self-pay

## 2021-11-16 ENCOUNTER — Encounter: Payer: Self-pay | Admitting: Cardiology

## 2021-11-16 VITALS — BP 150/96 | HR 76 | Ht 68.0 in | Wt 213.0 lb

## 2021-11-16 DIAGNOSIS — R0602 Shortness of breath: Secondary | ICD-10-CM

## 2021-11-16 DIAGNOSIS — I493 Ventricular premature depolarization: Secondary | ICD-10-CM | POA: Diagnosis not present

## 2021-11-16 MED ORDER — LISINOPRIL 20 MG PO TABS: 20.0000 mg | ORAL_TABLET | Freq: Every day | ORAL | 4 refills | Status: DC

## 2021-11-16 NOTE — Patient Instructions (Addendum)
Medication Instructions:   INCREASE LISINOPRIL 20 MG  TAKE ONE TABLET DAILY   *If you need a refill on your cardiac medications before your next appointment, please call your pharmacy*   Lab Work: TSH  If you have labs (blood work) drawn today and your tests are completely normal, you will receive your results only by: MyChart Message (if you have MyChart) OR A paper copy in the mail If you have any lab test that is abnormal or we need to change your treatment, we will call you to review the results.   Testing/Procedures: WILL BE SCHEDULE AT [address] Your physician has requested that you have an exercise tolerance test. Please also follow instruction sheet, as given.   AND    WILL BE SCHEDULE [address] Your physician has requested that you have an echocardiogram. Echocardiography is a painless test that uses sound waves to create images of your heart. It provides your doctor with information about the size and shape of your heart and how well your hearts chambers and valves are working. This procedure takes approximately one hour. There are no restrictions for this procedure.  Follow-Up: At [facility], you and your health needs are our priority.  As part of our continuing mission to provide you with exceptional heart care, we have created designated Provider Care Teams.  These Care Teams include your primary Cardiologist (physician) and Advanced Practice Providers (APPs -  Physician Assistants and Nurse Practitioners) who all work together to provide you with the care you need, when you need it.     Your next appointment:   1 month(s)  The format for your next appointment:   In Person  Provider:   Rollene Rotunda, MD

## 2021-11-18 ENCOUNTER — Telehealth (HOSPITAL_COMMUNITY): Payer: Self-pay | Admitting: *Deleted

## 2021-11-18 NOTE — Telephone Encounter (Signed)
Close encounter 

## 2021-11-23 ENCOUNTER — Ambulatory Visit (HOSPITAL_COMMUNITY)
Admission: RE | Admit: 2021-11-23 | Discharge: 2021-11-23 | Disposition: A | Discharge status: Home / Self Care | Payer: No Typology Code available for payment source | Source: Ambulatory Visit | Attending: Cardiology | Admitting: Cardiology

## 2021-11-23 ENCOUNTER — Other Ambulatory Visit: Payer: Self-pay

## 2021-11-23 DIAGNOSIS — I493 Ventricular premature depolarization: Secondary | ICD-10-CM | POA: Diagnosis present

## 2021-11-23 DIAGNOSIS — R0602 Shortness of breath: Secondary | ICD-10-CM | POA: Insufficient documentation

## 2021-11-23 LAB — EXERCISE TOLERANCE TEST
Angina Index: 0
Estimated workload: 6.5
Exercise duration (min): 4 min
Exercise duration (sec): 37 s
MPHR: 155 {beats}/min
Peak HR: 118 {beats}/min
Percent HR: 76 %
Rest HR: 65 {beats}/min

## 2021-11-30 ENCOUNTER — Ambulatory Visit (HOSPITAL_COMMUNITY): Payer: Medicare Other | Attending: Cardiovascular Disease

## 2021-11-30 ENCOUNTER — Other Ambulatory Visit: Payer: Self-pay

## 2021-11-30 DIAGNOSIS — I493 Ventricular premature depolarization: Secondary | ICD-10-CM | POA: Diagnosis not present

## 2021-11-30 DIAGNOSIS — R0602 Shortness of breath: Secondary | ICD-10-CM | POA: Insufficient documentation

## 2021-11-30 LAB — ECHOCARDIOGRAM COMPLETE
Area-P 1/2: 3.17 cm2
S' Lateral: 3.4 cm

## 2021-12-26 NOTE — Progress Notes (Deleted)
?  ?Cardiology Office Note ? ? ?Date:  12/26/2021  ? ?ID:  Connie MarvelHelen Wilson, DOB 06-25-1956, MRN [id number] ? ?PCP:  Paschal Dopparroll, Michael J, PA  ?Cardiologist:   Rollene RotundaJames Anthonia Monger, MD ?Referring:  ED ? ?No chief complaint on file. ? ? ?  ?History of Present Illness: ?Connie MarvelHelen Wilson is a 66 y.o. female who presents for evaluation of SOB and palpitations.    Her CXR was normal but she did have a minimally elevated BNP.  She was in the emergency room on February 1 and I reviewed these records for this visit.  She had shortness of breath.  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  She had multiple PVCs on tele.   ? ?She was in the ED in Feb with palpitations.  I reviewed these records for this visit.    There were no significant arrhythmias.    She had SOB.  POET (Plain Old Exercise Treadmill) was not conclusive.  I was going to schedule a CT but she did not want to proceed with this. ***  ? ?*** She reports that she went to urgent care because she was short of breath.  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  She did not really feel her heart racing or skipping although she says now she feels some palpitations.  She states she was short of breath the next day which was when she went to the urgent care.  She is not really describing PND or orthopnea although she says she does not sleep.  Her daughter who was on the phone says she has a lot of anxiety.  The patient is not really describing substernal chest pressure.  There is some vague discomfort.  She is not having neck or arm pain.  She has had no presyncope or syncope though she has dizziness when she turns her head.  She has had difficult to control hypertension and clonidine was just added.  She has not had any new weight gain or edema. ? ? ?Past Medical History:  ?Diagnosis Date  ? Allergy   ? Anxiety   ? Arthritis   ? Cataract   ? Bilateral eyes, has not needed surgery yet  ? Depression   ? Fracture of  elbow   ? right  ? Hyperlipidemia   ? Hypertension   ? ? ?Past Surgical History:  ?Procedure Laterality Date  ? FRACTURE SURGERY    ? fx. R ankle    ? TUBAL LIGATION    ? ? ? ?Current Outpatient Medications  ?Medication Sig Dispense Refill  ? Ascorbic Acid (VITAMIN C) 1000 MG tablet Take 1,000 mg by mouth daily.    ? calcium citrate (CALCITRATE - DOSED IN MG ELEMENTAL CALCIUM) 950 MG tablet Take 1 tablet by mouth 2 (two) times daily.    ? citalopram (CELEXA) 20 MG tablet Take 1 tablet (20 mg total) by mouth daily. 90 tablet 0  ? cloNIDine (CATAPRES) 0.2 MG tablet Take 0.2 mg by mouth 2 (two) times daily.    ? cyclobenzaprine (FLEXERIL) 5 MG tablet Take 1 tablet (5 mg total) by mouth daily as needed for muscle spasms. (Patient not taking: Reported on 11/16/2021) 7 tablet 0  ? fluticasone (FLONASE) 50 MCG/ACT nasal spray Place 2 sprays into both nostrils daily. 16 g 6  ? lisinopril (ZESTRIL) 20 MG tablet Take 1 tablet (20 mg total) by mouth daily.  30 tablet 4  ? Multiple Vitamin (MULTIVITAMIN) tablet Take 1 tablet by mouth daily.    ? Omega-3 Fatty Acids (FISH OIL) 1000 MG CAPS Take by mouth.    ? ?No current facility-administered medications for this visit.  ? ? ?Allergies:   Clindamycin/lincomycin and Penicillins  ? ? ?ROS:  Please see the history of present illness.   Otherwise, review of systems are positive for *** .   All other systems are reviewed and negative.  ? ? ?PHYSICAL EXAM: ?VS:  There were no vitals taken for this visit. , BMI There is no height or weight on file to calculate BMI. ?GENERAL:  Well appearing ?NECK:  No jugular venous distention, waveform within normal limits, carotid upstroke brisk and symmetric, no bruits, no thyromegaly ?LUNGS:  Clear to auscultation bilaterally ?CHEST:  Unremarkable ?HEART:  PMI not displaced or sustained,S1 and S2 within normal limits, no S3, no S4, no clicks, no rubs, *** murmurs ?ABD:  Flat, positive bowel sounds normal in frequency in pitch, no bruits, no rebound,  no guarding, no midline pulsatile mass, no hepatomegaly, no splenomegaly ?EXT:  2 plus pulses throughout, no edema, no cyanosis no clubbing ? ? ? ? ? ? ?***GENERAL:  Well appearing ?HEENT:  Pupils equal round and reactive, fundi not visualized, oral mucosa unremarkable ?NECK:  No jugular venous distention, waveform within normal limits, carotid upstroke brisk and symmetric, no bruits, no thyromegaly ?LYMPHATICS:  No cervical, inguinal adenopathy ?LUNGS:  Clear to auscultation bilaterally ?BACK:  No CVA tenderness ?CHEST:  Unremarkable ?HEART:  PMI not displaced or sustained,S1 and S2 within normal limits, no S3, no S4, no clicks, no rubs, no murmurs ?ABD:  Flat, positive bowel sounds normal in frequency in pitch, no bruits, no rebound, no guarding, no midline pulsatile mass, no hepatomegaly, no splenomegaly ?EXT:  2 plus pulses throughout, no edema, no cyanosis no clubbing ?SKIN:  No rashes no nodules ?NEURO:  Cranial nerves II through XII grossly intact, motor grossly intact throughout ?PSYCH:  Cognitively intact, oriented to person place and time ? ? ? ?EKG:  EKG is *** ordered today. ?The ekg ordered today demonstrates sinus rhythm, rate ***, axis within normal limits, QTc slightly prolonged, early transition lead V2, nonspecific anterior T wave changes. ? ? ?Recent Labs: ?01/08/2021: ALT 17 ?11/10/2021: B Natriuretic Peptide 150.7; BUN 10; Creatinine, Ser 0.82; Hemoglobin 13.1; Magnesium 1.8; Platelets 234; Potassium 3.4; Sodium 140  ? ? ?Lipid Panel ?   ?Component Value Date/Time  ? CHOL 271 (H) 03/16/2020 0902  ? CHOL 275 (H) 02/06/2017 1010  ? TRIG 274.0 (H) 03/16/2020 0902  ? HDL 38.60 (L) 03/16/2020 0902  ? HDL 52 02/06/2017 1010  ? CHOLHDL 7 03/16/2020 0902  ? VLDL 54.8 (H) 03/16/2020 0902  ? LDLCALC 181 (H) 03/05/2019 1011  ? LDLCALC 193 (H) 02/06/2017 1010  ? LDLDIRECT 189.0 03/16/2020 0902  ? ?  ? ?Wt Readings from Last 3 Encounters:  ?11/16/21 213 lb (96.6 kg)  ?11/10/21 216 lb (98 kg)  ?07/08/21 222 lb  12.8 oz (101.1 kg)  ?  ? ? ?Other studies Reviewed: ?Additional studies/ records that were reviewed today include: *** ?Review of the above records demonstrates:  Please see elsewhere in the note.   ? ? ?ASSESSMENT AND PLAN: ? ?SOB: ***  I am going to start with a POET (Plain Old Exercise Treadmill) to screen for obstructive coronary disease and an echocardiogram.  Further management will be based on these results. ? ?PVCs:   ***  I will check a TSH.  Other electrolytes were okay.  Further evaluation will be pending the results of the above although I do not think she needs long-term monitoring if she is not feeling these and she has normal studies otherwise.   ? ?HTN: Her blood pressure is *** elevated but she says this is unusual.  She will keep a blood pressure diary. ? ?Current medicines are reviewed at length with the patient today.  The patient does not have concerns regarding medicines. ? ?The following changes have been made:  *** ? ?Labs/ tests ordered today include:  *** ? ?No orders of the defined types were placed in this encounter. ? ? ? ?Disposition:   FU with ***  ? ? ?Signed, ?Rollene Rotunda, MD  ?12/26/2021 3:02 PM    ?[facility] ? ? ? ?

## 2021-12-27 ENCOUNTER — Ambulatory Visit: Payer: Medicare Other | Admitting: Cardiology

## 2021-12-27 ENCOUNTER — Telehealth: Payer: Self-pay

## 2021-12-27 DIAGNOSIS — I493 Ventricular premature depolarization: Secondary | ICD-10-CM

## 2021-12-27 DIAGNOSIS — R0602 Shortness of breath: Secondary | ICD-10-CM

## 2021-12-27 DIAGNOSIS — I10 Essential (primary) hypertension: Secondary | ICD-10-CM

## 2021-12-27 NOTE — Telephone Encounter (Signed)
Spoke to patient advised she missed her follow up visit with Dr.Hochrein this morning.Stated she is feeling better.She does not want to reschedule. ?

## 2021-12-31 ENCOUNTER — Encounter: Payer: Self-pay | Admitting: Cardiology

## 2022-03-18 ENCOUNTER — Encounter: Payer: Self-pay | Admitting: Gastroenterology

## 2022-03-24 ENCOUNTER — Other Ambulatory Visit: Payer: Self-pay | Admitting: Cardiology

## 2022-06-23 ENCOUNTER — Other Ambulatory Visit: Payer: Self-pay

## 2022-06-23 ENCOUNTER — Ambulatory Visit (AMBULATORY_SURGERY_CENTER): Payer: Self-pay

## 2022-06-23 ENCOUNTER — Telehealth: Payer: Self-pay

## 2022-06-23 VITALS — Ht 68.0 in | Wt 216.8 lb

## 2022-06-23 DIAGNOSIS — Z800 Family history of malignant neoplasm of digestive organs: Secondary | ICD-10-CM

## 2022-06-23 MED ORDER — NA SULFATE-K SULFATE-MG SULF 17.5-3.13-1.6 GM/177ML PO SOLN: 1.0000 | Freq: Once | ORAL | 0 refills | Status: AC

## 2022-06-23 NOTE — Telephone Encounter (Signed)
NoShow PV today 9/14 at 8am. Only phone number in chart not in service. Called contact (Daughter) April Fleming who reports she likely has a new phone. Daughter states she will tell pt to call today to r/s PV and understands if she does not call today, both PV and colonoscopy scheduled for 07/21/22 will be cancelled.  Arlys John (PV)

## 2022-06-23 NOTE — Progress Notes (Signed)
Denies allergies to eggs or soy products. Denies complication of anesthesia or sedation. Denies use of weight loss medication. Denies use of O2.   Emmi instructions given for colonoscopy.  

## 2022-07-08 ENCOUNTER — Encounter: Payer: Self-pay | Admitting: Gastroenterology

## 2022-07-21 ENCOUNTER — Telehealth: Payer: Self-pay | Admitting: Gastroenterology

## 2022-07-21 ENCOUNTER — Encounter: Payer: Medicare Other | Admitting: Gastroenterology

## 2022-07-21 NOTE — Telephone Encounter (Signed)
Please charge patient for [facility] no show.  ?

## 2022-07-21 NOTE — Telephone Encounter (Signed)
Called patient-- mailbox full- unable to leave a message.-- NO Showing the patient.

## 2022-08-06 ENCOUNTER — Emergency Department (HOSPITAL_COMMUNITY): Payer: Medicare HMO

## 2022-08-06 ENCOUNTER — Inpatient Hospital Stay (HOSPITAL_COMMUNITY)
Admission: EM | Admit: 2022-08-06 | Discharge: 2022-08-17 | DRG: 853 | Disposition: A | Discharge status: Skilled Nursing Facility | Payer: Medicare HMO | Attending: Internal Medicine | Admitting: Internal Medicine

## 2022-08-06 ENCOUNTER — Other Ambulatory Visit: Payer: Self-pay

## 2022-08-06 DIAGNOSIS — E119 Type 2 diabetes mellitus without complications: Secondary | ICD-10-CM

## 2022-08-06 DIAGNOSIS — Z8249 Family history of ischemic heart disease and other diseases of the circulatory system: Secondary | ICD-10-CM

## 2022-08-06 DIAGNOSIS — Z87440 Personal history of urinary (tract) infections: Secondary | ICD-10-CM

## 2022-08-06 DIAGNOSIS — F32A Depression, unspecified: Secondary | ICD-10-CM | POA: Diagnosis present

## 2022-08-06 DIAGNOSIS — Z7984 Long term (current) use of oral hypoglycemic drugs: Secondary | ICD-10-CM

## 2022-08-06 DIAGNOSIS — B964 Proteus (mirabilis) (morganii) as the cause of diseases classified elsewhere: Secondary | ICD-10-CM | POA: Diagnosis present

## 2022-08-06 DIAGNOSIS — Z87891 Personal history of nicotine dependence: Secondary | ICD-10-CM

## 2022-08-06 DIAGNOSIS — Z880 Allergy status to penicillin: Secondary | ICD-10-CM

## 2022-08-06 DIAGNOSIS — L89154 Pressure ulcer of sacral region, stage 4: Secondary | ICD-10-CM | POA: Diagnosis present

## 2022-08-06 DIAGNOSIS — L8990 Pressure ulcer of unspecified site, unspecified stage: Secondary | ICD-10-CM | POA: Insufficient documentation

## 2022-08-06 DIAGNOSIS — Z881 Allergy status to other antibiotic agents status: Secondary | ICD-10-CM

## 2022-08-06 DIAGNOSIS — A419 Sepsis, unspecified organism: Principal | ICD-10-CM

## 2022-08-06 DIAGNOSIS — E876 Hypokalemia: Secondary | ICD-10-CM | POA: Diagnosis not present

## 2022-08-06 DIAGNOSIS — L03312 Cellulitis of back [any part except buttock]: Secondary | ICD-10-CM

## 2022-08-06 DIAGNOSIS — E785 Hyperlipidemia, unspecified: Secondary | ICD-10-CM | POA: Diagnosis present

## 2022-08-06 DIAGNOSIS — R6520 Severe sepsis without septic shock: Secondary | ICD-10-CM | POA: Diagnosis present

## 2022-08-06 DIAGNOSIS — Z1152 Encounter for screening for COVID-19: Secondary | ICD-10-CM

## 2022-08-06 DIAGNOSIS — I10 Essential (primary) hypertension: Secondary | ICD-10-CM | POA: Diagnosis present

## 2022-08-06 DIAGNOSIS — Z79899 Other long term (current) drug therapy: Secondary | ICD-10-CM

## 2022-08-06 DIAGNOSIS — F419 Anxiety disorder, unspecified: Secondary | ICD-10-CM | POA: Diagnosis present

## 2022-08-06 DIAGNOSIS — Z8673 Personal history of transient ischemic attack (TIA), and cerebral infarction without residual deficits: Secondary | ICD-10-CM

## 2022-08-06 DIAGNOSIS — L98499 Non-pressure chronic ulcer of skin of other sites with unspecified severity: Principal | ICD-10-CM

## 2022-08-06 DIAGNOSIS — E1152 Type 2 diabetes mellitus with diabetic peripheral angiopathy with gangrene: Secondary | ICD-10-CM | POA: Diagnosis present

## 2022-08-06 DIAGNOSIS — G9341 Metabolic encephalopathy: Secondary | ICD-10-CM | POA: Diagnosis present

## 2022-08-06 DIAGNOSIS — Z83438 Family history of other disorder of lipoprotein metabolism and other lipidemia: Secondary | ICD-10-CM

## 2022-08-06 LAB — CBC WITH DIFFERENTIAL/PLATELET
Abs Immature Granulocytes: 0 10*3/uL (ref 0.00–0.07)
Basophils Absolute: 0 10*3/uL (ref 0.0–0.1)
Basophils Relative: 0 %
Eosinophils Absolute: 0 10*3/uL (ref 0.0–0.5)
Eosinophils Relative: 0 %
HCT: 50.6 % — ABNORMAL HIGH (ref 36.0–46.0)
Hemoglobin: 16.9 g/dL — ABNORMAL HIGH (ref 12.0–15.0)
Lymphocytes Relative: 8 %
Lymphs Abs: 2.2 10*3/uL (ref 0.7–4.0)
MCH: 28.3 pg (ref 26.0–34.0)
MCHC: 33.4 g/dL (ref 30.0–36.0)
MCV: 84.6 fL (ref 80.0–100.0)
Monocytes Absolute: 1.4 10*3/uL — ABNORMAL HIGH (ref 0.1–1.0)
Monocytes Relative: 5 %
Neutro Abs: 24.2 10*3/uL — ABNORMAL HIGH (ref 1.7–7.7)
Neutrophils Relative %: 87 %
Platelets: 301 10*3/uL (ref 150–400)
RBC: 5.98 MIL/uL — ABNORMAL HIGH (ref 3.87–5.11)
RDW: 15 % (ref 11.5–15.5)
WBC: 27.8 10*3/uL — ABNORMAL HIGH (ref 4.0–10.5)
nRBC: 0 % (ref 0.0–0.2)
nRBC: 0 /100 WBC

## 2022-08-06 LAB — TROPONIN I (HIGH SENSITIVITY)
Troponin I (High Sensitivity): 21 ng/L — ABNORMAL HIGH (ref <18)
Troponin I (High Sensitivity): 18 ng/L — ABNORMAL HIGH (ref <18)

## 2022-08-06 LAB — COMPREHENSIVE METABOLIC PANEL
ALT: 30 U/L (ref 0–44)
AST: 36 U/L (ref 15–41)
Albumin: 3 g/dL — ABNORMAL LOW (ref 3.5–5.0)
Alkaline Phosphatase: 103 U/L (ref 38–126)
Anion gap: 15 (ref 5–15)
BUN: 49 mg/dL — ABNORMAL HIGH (ref 8–23)
CO2: 25 mmol/L (ref 22–32)
Calcium: 10.5 mg/dL — ABNORMAL HIGH (ref 8.9–10.3)
Chloride: 105 mmol/L (ref 98–111)
Creatinine, Ser: 1.14 mg/dL — ABNORMAL HIGH (ref 0.44–1.00)
GFR, Estimated: 53 mL/min — ABNORMAL LOW (ref >60)
Glucose, Bld: 163 mg/dL — ABNORMAL HIGH (ref 70–99)
Potassium: 4.1 mmol/L (ref 3.5–5.1)
Sodium: 145 mmol/L (ref 135–145)
Total Bilirubin: 1.2 mg/dL (ref 0.3–1.2)
Total Protein: 7.9 g/dL (ref 6.5–8.1)

## 2022-08-06 LAB — ETHANOL: Alcohol, Ethyl (B): <10 mg/dL (ref <10)

## 2022-08-06 LAB — CK: Total CK: 417 U/L — ABNORMAL HIGH (ref 38–234)

## 2022-08-06 LAB — AMMONIA: Ammonia: 16 umol/L (ref 9–35)

## 2022-08-06 LAB — SARS CORONAVIRUS 2 BY RT PCR: SARS Coronavirus 2 by RT PCR: NEGATIVE

## 2022-08-06 MED ORDER — PIPERACILLIN-TAZOBACTAM 3.375 G IVPB 30 MIN
3.3750 g | Freq: Once | INTRAVENOUS | Status: AC
  Administered 2022-08-06: 3.375 g via INTRAVENOUS
  Filled 2022-08-06: qty 50

## 2022-08-06 MED ORDER — VANCOMYCIN HCL IN DEXTROSE 1-5 GM/200ML-% IV SOLN: 1000.0000 mg | INTRAVENOUS | Status: DC

## 2022-08-06 MED ORDER — SODIUM CHLORIDE 0.9 % IV BOLUS
1000.0000 mL | Freq: Once | INTRAVENOUS | Status: AC
  Administered 2022-08-06: 1000 mL via INTRAVENOUS

## 2022-08-06 MED ORDER — PIPERACILLIN-TAZOBACTAM 3.375 G IVPB: 3.3750 g | Freq: Three times a day (TID) | INTRAVENOUS | Status: DC

## 2022-08-06 MED ORDER — VANCOMYCIN HCL 1500 MG/300ML IV SOLN
1500.0000 mg | Freq: Once | INTRAVENOUS | Status: AC
  Administered 2022-08-06: 1500 mg via INTRAVENOUS
  Filled 2022-08-06: qty 300

## 2022-08-06 NOTE — Progress Notes (Signed)
Pharmacy Antibiotic Note  Laylanie Kruczek is a 66 y.o. female admitted on 08/06/2022 presenting with AMS, soiled wounds, concern for sepsis.  Pharmacy has been consulted for vancomycin and zosyn dosing.  Plan: Vancomycin 1500 mg IV x 1, then 1000 mg IV q 24h (eAUC 453) Zosyn 3.375g IV every 8 hours (extended 4h infusion) Monitor renal function, Cx and clinical progression to narrow Vancomycin levels as needed     Temp (24hrs), Avg:98.3 F (36.8 C), Min:98.3 F (36.8 C), Max:98.3 F (36.8 C)  Recent Labs  Lab 08/06/22 2058  WBC 27.8*  CREATININE 1.14*    CrCl cannot be calculated (Unknown ideal weight.).    Allergies  Allergen Reactions   Clindamycin/Lincomycin     Pt report C-diff   Penicillins Rash    Has patient had a PCN reaction causing immediate rash, facial/tongue/throat swelling, SOB or lightheadedness with hypotension: No (pt  Did have rash) Has patient had a PCN reaction causing severe rash involving mucus membranes or skin necrosis: No Has patient had a PCN reaction that required hospitalization: No Has patient had a PCN reaction occurring within the last 10 years:No If all of the above answers are "NO", then may proceed with Cephalosporin use.     Bertis Ruddy, PharmD Clinical Pharmacist ED Pharmacist Phone # [phone] 08/06/2022 9:55 PM

## 2022-08-06 NOTE — ED Provider Notes (Signed)
[facility] EMERGENCY DEPARTMENT Provider Note   CSN: [id number] Arrival date & time: 08/06/22  2019     History {Add pertinent medical, surgical, social history, OB history to HPI:1} Chief Complaint  Patient presents with   Fall    Patient BIB EMS due to fall unknown downtime. Patient confused, no bloodthinners. C-collar placed. Find in bowel and urine. Family stated x1wk of mail in mailbox. Lower right side pain in back 10/10. H/O TIA. 152/94, 120, 16, 96%, CBG 145    Connie Wilson is a 66 y.o. female presenting from home with altered mental status.  The patient is brought in by EMS after family found her lying down in a pile of urine soaked, feces soaked close.  It is not clear how long she has been down.  The patient cannot provide any further history.  She is whispering and incoherent.  She reports she has a sore on her back.  Family reports that the house appeared to be in recent neglect with about a weeks full of mail in the mailbox.  External records reviewed including family practice notes, patient has a history of hypertension, high blood sugar, hyperlipidemia.  No other significant medical history.  She has chronic low back pain.  She is not on blood thinners  HPI     Home Medications Prior to Admission medications   Medication Sig Start Date End Date Taking? Authorizing Provider  Ascorbic Acid (VITAMIN C) 1000 MG tablet Take 1,000 mg by mouth daily.    [provider]  calcium citrate (CALCITRATE - DOSED IN MG ELEMENTAL CALCIUM) 950 MG tablet Take 1 tablet by mouth 2 (two) times daily.    [provider]  citalopram (CELEXA) 20 MG tablet Take 1 tablet (20 mg total) by mouth daily. 07/08/21   Elenore Paddy, NP  cloNIDine (CATAPRES) 0.2 MG tablet Take 0.2 mg by mouth 2 (two) times daily. 11/02/21   [provider]  cyclobenzaprine (FLEXERIL) 5 MG tablet Take 1 tablet (5 mg total) by mouth daily as needed for muscle  spasms. Patient not taking: Reported on 11/16/2021 04/13/20   Robinson, [place] N, PA-C  fluticasone [facility]) 50 MCG/ACT nasal spray Place 2 sprays into both nostrils daily. 08/02/18   Olive Bass, FNP  lisinopril (ZESTRIL) 20 MG tablet Take 1 tablet by mouth once daily 03/25/22   Rollene Rotunda, MD  metFORMIN (GLUCOPHAGE) 500 MG tablet Take 500 mg by mouth daily with breakfast.    [provider]  Multiple Vitamin (MULTIVITAMIN) tablet Take 1 tablet by mouth daily.    [provider]  Omega-3 Fatty Acids (FISH OIL) 1000 MG CAPS Take by mouth.    [provider]  OVER THE COUNTER MEDICATION Vitamin D 3 one capsule daily.    [provider]      Allergies    Clindamycin/lincomycin and Penicillins    Review of Systems   Review of Systems  Physical Exam Updated Vital Signs BP 103/85   Temp 98.3 F (36.8 C) (Oral)   Resp (!) 26  Physical Exam Constitutional:      General: She is not in acute distress.    Comments: Patient smelled strongly of feces and urine Whispering  HENT:     Head: Normocephalic and atraumatic.  Eyes:     Conjunctiva/sclera: Conjunctivae normal.     Pupils: Pupils are equal, round, and reactive to light.  Cardiovascular:     Rate and Rhythm: Normal rate and regular rhythm.  Pulmonary:     Effort: Pulmonary effort is normal. No respiratory distress.  Abdominal:     General: There is no distension.     Tenderness: There is no abdominal tenderness.  Skin:    General: Skin is warm and dry.     Comments: Large ulcerated lesion above the gluteal cleft, skin redness with breakdown diffusely involving the lower back  Neurological:     Mental Status: She is alert and oriented to person, place, and time. Mental status is at baseline.     Comments: Moving all extremities to command, GCS 15     ED Results / Procedures / Treatments   Labs (all labs ordered are listed, but only abnormal results are displayed) Labs  Reviewed  SARS CORONAVIRUS 2 BY RT PCR  COMPREHENSIVE METABOLIC PANEL  CBC WITH DIFFERENTIAL/PLATELET  URINALYSIS, ROUTINE W REFLEX MICROSCOPIC  RAPID URINE DRUG SCREEN, HOSP PERFORMED  CK  ETHANOL  AMMONIA  TROPONIN I (HIGH SENSITIVITY)    EKG None  Radiology No results found.  Procedures Procedures  {Document cardiac monitor, telemetry assessment procedure when appropriate:1}  Medications Ordered in ED Medications  sodium chloride 0.9 % bolus 1,000 mL (has no administration in time range)    ED Course/ Medical Decision Making/ A&P                           Medical Decision Making Amount and/or Complexity of Data Reviewed Labs: ordered. Radiology: ordered.   This patient presents to the Emergency Department with complaint of altered mental status.  This involves an extensive number of treatment options, and is a complaint that carries with it a high risk of complications and morbidity.  The differential diagnosis includes hypoglycemia vs metabolic encephalopathy vs infection (including cystitis) vs ICH vs stroke vs polypharmacy vs other  I ordered, reviewed, and interpreted labs, including *** I ordered medication *** for *** I ordered imaging studies which included ***  I independently visualized and interpreted imaging which showed *** and the monitor tracing which showed *** Additional history was obtained from *** Previous records obtained and reviewed showing *** I personally reviewed the patients ECG which showed sinus rhythm with no acute ischemic findings***  I consulted *** and discussed lab and imaging findings  After the interventions stated above, I reevaluated the patient and found ***    {Document critical care time when appropriate:1} {Document review of labs and clinical decision tools ie heart score, Chads2Vasc2 etc:1}  {Document your independent review of radiology images, and any outside records:1} {Document your discussion with family  members, caretakers, and with consultants:1} {Document social determinants of health affecting pt's care:1} {Document your decision making why or why not admission, treatments were needed:1} Final Clinical Impression(s) / ED Diagnoses Final diagnoses:  None    Rx / DC Orders ED Discharge Orders     None

## 2022-08-07 ENCOUNTER — Inpatient Hospital Stay (HOSPITAL_COMMUNITY): Payer: Medicare HMO

## 2022-08-07 DIAGNOSIS — A419 Sepsis, unspecified organism: Secondary | ICD-10-CM

## 2022-08-07 DIAGNOSIS — B964 Proteus (mirabilis) (morganii) as the cause of diseases classified elsewhere: Secondary | ICD-10-CM | POA: Diagnosis present

## 2022-08-07 DIAGNOSIS — E876 Hypokalemia: Secondary | ICD-10-CM | POA: Diagnosis not present

## 2022-08-07 DIAGNOSIS — E119 Type 2 diabetes mellitus without complications: Secondary | ICD-10-CM | POA: Diagnosis not present

## 2022-08-07 DIAGNOSIS — F419 Anxiety disorder, unspecified: Secondary | ICD-10-CM | POA: Diagnosis present

## 2022-08-07 DIAGNOSIS — R55 Syncope and collapse: Secondary | ICD-10-CM | POA: Diagnosis not present

## 2022-08-07 DIAGNOSIS — Z1152 Encounter for screening for COVID-19: Secondary | ICD-10-CM | POA: Diagnosis not present

## 2022-08-07 DIAGNOSIS — R4182 Altered mental status, unspecified: Secondary | ICD-10-CM

## 2022-08-07 DIAGNOSIS — Z87891 Personal history of nicotine dependence: Secondary | ICD-10-CM | POA: Diagnosis not present

## 2022-08-07 DIAGNOSIS — Z83438 Family history of other disorder of lipoprotein metabolism and other lipidemia: Secondary | ICD-10-CM | POA: Diagnosis not present

## 2022-08-07 DIAGNOSIS — E785 Hyperlipidemia, unspecified: Secondary | ICD-10-CM | POA: Diagnosis present

## 2022-08-07 DIAGNOSIS — E1152 Type 2 diabetes mellitus with diabetic peripheral angiopathy with gangrene: Secondary | ICD-10-CM | POA: Diagnosis present

## 2022-08-07 DIAGNOSIS — Z7984 Long term (current) use of oral hypoglycemic drugs: Secondary | ICD-10-CM | POA: Diagnosis not present

## 2022-08-07 DIAGNOSIS — R6510 Systemic inflammatory response syndrome (SIRS) of non-infectious origin without acute organ dysfunction: Secondary | ICD-10-CM | POA: Diagnosis not present

## 2022-08-07 DIAGNOSIS — Z880 Allergy status to penicillin: Secondary | ICD-10-CM | POA: Diagnosis not present

## 2022-08-07 DIAGNOSIS — Z881 Allergy status to other antibiotic agents status: Secondary | ICD-10-CM | POA: Diagnosis not present

## 2022-08-07 DIAGNOSIS — R6520 Severe sepsis without septic shock: Secondary | ICD-10-CM | POA: Diagnosis present

## 2022-08-07 DIAGNOSIS — Z8249 Family history of ischemic heart disease and other diseases of the circulatory system: Secondary | ICD-10-CM | POA: Diagnosis not present

## 2022-08-07 DIAGNOSIS — S31809A Unspecified open wound of unspecified buttock, initial encounter: Secondary | ICD-10-CM | POA: Diagnosis not present

## 2022-08-07 DIAGNOSIS — L89154 Pressure ulcer of sacral region, stage 4: Secondary | ICD-10-CM | POA: Diagnosis present

## 2022-08-07 DIAGNOSIS — Z79899 Other long term (current) drug therapy: Secondary | ICD-10-CM | POA: Diagnosis not present

## 2022-08-07 DIAGNOSIS — L03312 Cellulitis of back [any part except buttock]: Secondary | ICD-10-CM | POA: Diagnosis present

## 2022-08-07 DIAGNOSIS — L98499 Non-pressure chronic ulcer of skin of other sites with unspecified severity: Secondary | ICD-10-CM | POA: Diagnosis not present

## 2022-08-07 DIAGNOSIS — G9341 Metabolic encephalopathy: Secondary | ICD-10-CM | POA: Diagnosis present

## 2022-08-07 DIAGNOSIS — F32A Depression, unspecified: Secondary | ICD-10-CM | POA: Diagnosis present

## 2022-08-07 DIAGNOSIS — I10 Essential (primary) hypertension: Secondary | ICD-10-CM | POA: Diagnosis present

## 2022-08-07 DIAGNOSIS — Z8673 Personal history of transient ischemic attack (TIA), and cerebral infarction without residual deficits: Secondary | ICD-10-CM | POA: Diagnosis not present

## 2022-08-07 DIAGNOSIS — Z87440 Personal history of urinary (tract) infections: Secondary | ICD-10-CM | POA: Diagnosis not present

## 2022-08-07 LAB — COMPREHENSIVE METABOLIC PANEL
ALT: 25 U/L (ref 0–44)
AST: 34 U/L (ref 15–41)
Albumin: 2.3 g/dL — ABNORMAL LOW (ref 3.5–5.0)
Alkaline Phosphatase: 80 U/L (ref 38–126)
Anion gap: 17 — ABNORMAL HIGH (ref 5–15)
BUN: 41 mg/dL — ABNORMAL HIGH (ref 8–23)
CO2: 16 mmol/L — ABNORMAL LOW (ref 22–32)
Calcium: 9 mg/dL (ref 8.9–10.3)
Chloride: 113 mmol/L — ABNORMAL HIGH (ref 98–111)
Creatinine, Ser: 0.88 mg/dL (ref 0.44–1.00)
GFR, Estimated: >60 mL/min (ref >60)
Glucose, Bld: 143 mg/dL — ABNORMAL HIGH (ref 70–99)
Potassium: 3.8 mmol/L (ref 3.5–5.1)
Sodium: 146 mmol/L — ABNORMAL HIGH (ref 135–145)
Total Bilirubin: 1.1 mg/dL (ref 0.3–1.2)
Total Protein: 6.2 g/dL — ABNORMAL LOW (ref 6.5–8.1)

## 2022-08-07 LAB — CBC
HCT: 43.1 % (ref 36.0–46.0)
HCT: 46.4 % — ABNORMAL HIGH (ref 36.0–46.0)
Hemoglobin: 14.6 g/dL (ref 12.0–15.0)
Hemoglobin: 14.8 g/dL (ref 12.0–15.0)
MCH: 29.1 pg (ref 26.0–34.0)
MCH: 28.8 pg (ref 26.0–34.0)
MCHC: 33.9 g/dL (ref 30.0–36.0)
MCHC: 31.9 g/dL (ref 30.0–36.0)
MCV: 85.9 fL (ref 80.0–100.0)
MCV: 90.3 fL (ref 80.0–100.0)
Platelets: 238 10*3/uL (ref 150–400)
Platelets: 249 10*3/uL (ref 150–400)
RBC: 5.02 MIL/uL (ref 3.87–5.11)
RBC: 5.14 MIL/uL — ABNORMAL HIGH (ref 3.87–5.11)
RDW: 15.3 % (ref 11.5–15.5)
RDW: 15.4 % (ref 11.5–15.5)
WBC: 28 10*3/uL — ABNORMAL HIGH (ref 4.0–10.5)
WBC: 27.7 10*3/uL — ABNORMAL HIGH (ref 4.0–10.5)
nRBC: 0 % (ref 0.0–0.2)
nRBC: 0 % (ref 0.0–0.2)

## 2022-08-07 LAB — URINALYSIS, ROUTINE W REFLEX MICROSCOPIC
Bilirubin Urine: NEGATIVE
Glucose, UA: NEGATIVE mg/dL
Hgb urine dipstick: NEGATIVE
Ketones, ur: 20 mg/dL — AB
Leukocytes,Ua: NEGATIVE
Nitrite: NEGATIVE
Protein, ur: NEGATIVE mg/dL
Specific Gravity, Urine: 1.027 (ref 1.005–1.030)
pH: 5 (ref 5.0–8.0)

## 2022-08-07 LAB — RAPID URINE DRUG SCREEN, HOSP PERFORMED
Amphetamines: NOT DETECTED
Barbiturates: NOT DETECTED
Benzodiazepines: NOT DETECTED
Cocaine: NOT DETECTED
Opiates: NOT DETECTED
Tetrahydrocannabinol: NOT DETECTED

## 2022-08-07 LAB — LACTIC ACID, PLASMA
Lactic Acid, Venous: 2 mmol/L (ref 0.5–1.9)
Lactic Acid, Venous: 2 mmol/L (ref 0.5–1.9)

## 2022-08-07 LAB — CREATININE, SERUM
Creatinine, Ser: 1.02 mg/dL — ABNORMAL HIGH (ref 0.44–1.00)
GFR, Estimated: >60 mL/min (ref >60)

## 2022-08-07 LAB — CBG MONITORING, ED
Glucose-Capillary: 177 mg/dL — ABNORMAL HIGH (ref 70–99)
Glucose-Capillary: 100 mg/dL — ABNORMAL HIGH (ref 70–99)
Glucose-Capillary: 119 mg/dL — ABNORMAL HIGH (ref 70–99)

## 2022-08-07 MED ORDER — ALBUTEROL SULFATE (2.5 MG/3ML) 0.083% IN NEBU: 2.5000 mg | INHALATION_SOLUTION | RESPIRATORY_TRACT | Status: DC | PRN

## 2022-08-07 MED ORDER — ONDANSETRON HCL 4 MG/2ML IJ SOLN: 4.0000 mg | Freq: Four times a day (QID) | INTRAMUSCULAR | Status: DC | PRN

## 2022-08-07 MED ORDER — VANCOMYCIN HCL 1250 MG/250ML IV SOLN
1250.0000 mg | INTRAVENOUS | Status: DC
  Administered 2022-08-07 – 2022-08-11 (×4): 1250 mg via INTRAVENOUS
  Filled 2022-08-07 (×4): qty 250

## 2022-08-07 MED ORDER — IOHEXOL 350 MG/ML SOLN
75.0000 mL | Freq: Once | INTRAVENOUS | Status: AC | PRN
  Administered 2022-08-07: 75 mL via INTRAVENOUS

## 2022-08-07 MED ORDER — HEPARIN SODIUM (PORCINE) 5000 UNIT/ML IJ SOLN
5000.0000 [IU] | Freq: Three times a day (TID) | INTRAMUSCULAR | Status: DC
  Administered 2022-08-07 – 2022-08-17 (×29): 5000 [IU] via SUBCUTANEOUS
  Filled 2022-08-07 (×30): qty 1

## 2022-08-07 MED ORDER — ACETAMINOPHEN 325 MG PO TABS: 650.0000 mg | ORAL_TABLET | Freq: Four times a day (QID) | ORAL | Status: DC | PRN

## 2022-08-07 MED ORDER — SODIUM CHLORIDE 0.9 % IV SOLN: INTRAVENOUS | Status: AC

## 2022-08-07 MED ORDER — PIPERACILLIN-TAZOBACTAM 3.375 G IVPB
3.3750 g | Freq: Three times a day (TID) | INTRAVENOUS | Status: DC
  Administered 2022-08-07 – 2022-08-09 (×7): 3.375 g via INTRAVENOUS
  Filled 2022-08-07 (×8): qty 50

## 2022-08-07 MED ORDER — ONDANSETRON HCL 4 MG PO TABS: 4.0000 mg | ORAL_TABLET | Freq: Four times a day (QID) | ORAL | Status: DC | PRN

## 2022-08-07 MED ORDER — SODIUM CHLORIDE 0.9 % IV BOLUS
1000.0000 mL | Freq: Once | INTRAVENOUS | Status: AC
  Administered 2022-08-07: 1000 mL via INTRAVENOUS

## 2022-08-07 MED ORDER — PIPERACILLIN-TAZOBACTAM 3.375 G IVPB 30 MIN: 3.3750 g | Freq: Three times a day (TID) | INTRAVENOUS | Status: DC

## 2022-08-07 MED ORDER — ACETAMINOPHEN 650 MG RE SUPP: 650.0000 mg | Freq: Four times a day (QID) | RECTAL | Status: DC | PRN

## 2022-08-07 NOTE — ED Notes (Signed)
C-collar taken off by MD, No s/s of any distress. No verbal c/o pain or discomfort

## 2022-08-07 NOTE — ED Notes (Signed)
PT at bedside.

## 2022-08-07 NOTE — ED Notes (Signed)
Patient transported to MRI 

## 2022-08-07 NOTE — ED Notes (Signed)
Patient repositioned for comfort.  Lights in room dimmed.

## 2022-08-07 NOTE — ED Notes (Signed)
Pt saturated in urine. RN changed pt and provided peri care. New chuck/ sheet applied. MD notified about wound. Pt unsuccessful with purwick.

## 2022-08-07 NOTE — Consult Note (Signed)
[facility] Nurse Consult Note: Reason for Consult:Patient found down for undetermined period of time in urine and stool. Unstageable pressure injury to right buttock at gluteal cleft, black eschar. Left buttock, at gluteal cleft there are 3 Unstageable wounds covered with black eschar. See photo taken on admission and uploaded to EHR. Wound type:Pressure, Unstageable Pressure Injury POA: Yes Measurement:Per Bedside RN, Thana Ates: Right buttock: 12cmx 12.2cm with depth unable to be determined due to the presence of eschar. Wound GPQ:DIYME eschar, nonviable Drainage (amount, consistency, odor) None. Area is often wet with urine as patient is unsuccessful using PurWick female urinary incontinence external management device with suction. Periwound:Areas of non blanching erythema, also right buttock at gluteal cleft area of 3 Unstageable wounds Dressing procedure/placement/frequency:I have provided guidance for Nursing to turn and reposition patient from side to side, minimizing time in the supine position. Bilateral pressure redistribution heel boots are provided. A mattress replacement with low air loss feature is provided. Topical care will be with twice daily cleanse followed by placement of a betadine moistened guaze dressing topped with dry dressing and secured with silicone foam for antimicrobial astringent properties and atraumatic dressing changes.   Recommend consultation with Surgery ([facility]) for their input to the POC and their assistance and oversight for what is likely to evolve into a full thickness defect. They may indicate a plastic surgery consultation is more appropriate. I defer to their expertise.  [place] nursing team will not follow, but will remain available to this patient, the nursing and medical teams.  Please re-consult if needed.  Thank you for inviting [place] to participate in this patient's Plan of Care.  Maudie Flakes, MSN, RN, CNS, [place], Serita Grammes, [employer], [employer] phone:  [phone]

## 2022-08-07 NOTE — ED Notes (Signed)
RN placed mittens on patient

## 2022-08-07 NOTE — ED Notes (Signed)
Patient confused trying to pull at lines, patient redirected. Will continue to monitor

## 2022-08-07 NOTE — ED Notes (Signed)
RN went to round on pt, pt had ripped out 2nd IV. RN placed a 3rd line sine pt ripped out other 2. RN cobanded line and asked sec to order mitts.

## 2022-08-07 NOTE — ED Notes (Signed)
Wound measurements- 12cm long, 12.2 wide, 3cm deep

## 2022-08-07 NOTE — Procedures (Signed)
Patient Name: [name]  MRN: [id number]  Epilepsy Attending: Lora Havens  Referring Physician/Provider: Clance Boll, MD  Date: 08/07/2022 Duration: 21.32 mins  Patient history: 67yo F with ams. EEG to evaluate for seizure  Level of alertness: Awake, asleep  AEDs during EEG study: None  Technical aspects: This EEG study was done with scalp electrodes positioned according to the 10-20 International system of electrode placement. Electrical activity was reviewed with band pass filter of 1-70Hz , sensitivity of 7 uV/mm, display speed of 40mm/sec with a 60Hz  notched filter applied as appropriate. EEG data were recorded continuously and digitally stored.  Video monitoring was available and reviewed as appropriate.  Description: The posterior dominant rhythm consists of 6.5 Hz activity of moderate voltage (25-35 uV) seen predominantly in posterior head regions, symmetric and reactive to eye opening and eye closing. Sleep was characterized by vertex waves, sleep spindles (12 to 14 Hz), maximal frontocentral region.  EEG showed continuous generalized 3 to 6 Hz theta-delta slowing. Hyperventilation and photic stimulation were not performed.     ABNORMALITY - Continuous slow, generalized  IMPRESSION: This study is suggestive of moderate diffuse encephalopathy, nonspecific etiology. No seizures or epileptiform discharges were seen throughout the recording.   Connie Wilson

## 2022-08-07 NOTE — Progress Notes (Signed)
Inpatient Rehab Admissions Coordinator:   Per therapy recommendations, patient was screened for CIR candidacy by Clemens Catholic, MS, CCC-SLP. Per current payor trends, Pt.'s insurance is very unlikely to approve CIR admit for her diagnosis. I will not pursue a rehab consult for this Pt.   Recommend other rehab venues to be pursued.  Please contact me with any questions.   Clemens Catholic, [place], [place] Admissions Coordinator  [phone] ([place]) [phone] (office)

## 2022-08-07 NOTE — Progress Notes (Signed)
CT Tech arrived right as EEG Tech was about to start application. EEG Tech waiting in room until patient returns.

## 2022-08-07 NOTE — Evaluation (Signed)
Physical Therapy Evaluation Patient Details Name: Connie Wilson MRN: [id number] DOB: [birth year] Today's Date: 08/07/2022  History of Present Illness  Pt is a 66 y.o. female who presented 08/06/22 after being found down at home with AMS. Pt admitted with acute metabolic encephalopathy associated with sepsis. PMH: DM2, HTN, TIA, depression, anxiety, recurrent UTI, HLD   Clinical Impression  Pt presents with condition above and deficits mentioned below, see PT Problem List. PTA, she reports she was mod I using a quad-cane for mobility and was living alone in a 1-level house with 1 STE. Pt is confused and may not be an accurate historian though. Pt is demonstrating deficits in gross overall strength, static and dynamic balance, power, cognition, activity tolerance, and skin integrity. She is requiring mod-maxA for bed mobility, modA for transfers, and modA to take a few side steps along edge of stretcher with UE support today. She fatigues quickly and is limited by pain where she has skin breakdown and wounds, specifically at her sacrum. Considering her drastic change in functional status and willingness to participate, recommending intensive therapy in the AIR setting. If she does not qualify for AIR, then recommending short-term rehab at a SNF. Will continue to follow acutely.      Recommendations for follow up therapy are one component of a multi-disciplinary discharge planning process, led by the attending physician.  Recommendations may be updated based on patient status, additional functional criteria and insurance authorization.  Follow Up Recommendations Acute inpatient rehab (3hours/day) (if not AIR then SNF)      Assistance Recommended at Discharge Frequent or constant Supervision/Assistance  Patient can return home with the following  A lot of help with walking and/or transfers;A lot of help with bathing/dressing/bathroom;Assistance with cooking/housework;Direct supervision/assist for  medications management;Direct supervision/assist for financial management;Assist for transportation;Help with stairs or ramp for entrance    Equipment Recommendations Rolling walker (2 wheels)  Recommendations for Other Services  Rehab consult;OT consult    Functional Status Assessment Patient has had a recent decline in their functional status and demonstrates the ability to make significant improvements in function in a reasonable and predictable amount of time.     Precautions / Restrictions Precautions Precautions: Fall;Other (comment) Precaution Comments: sacral and back wounds Restrictions Weight Bearing Restrictions: No      Mobility  Bed Mobility Overal bed mobility: Needs Assistance Bed Mobility: Rolling, Supine to Sit, Sit to Supine Rolling: Mod assist   Supine to sit: Max assist, HOB elevated Sit to supine: Mod assist, HOB elevated   General bed mobility comments: ModA and repeated verbal and tactile cues to reach for rails and rotate hips to roll either direction. MaxA to bring legs off EOB and ascend trunk. Pt would move legs when cued but then move them back often. ModA to lift legs and direct trunk back to supine.    Transfers Overall transfer level: Needs assistance Equipment used: 1 person hand held assist Transfers: Sit to/from Stand Sit to Stand: Mod assist, From elevated surface           General transfer comment: ModA to come to stand from edge of stretcher, cuing pt to place UEs on therapist for support, bil knees blocked for safety due to instability noted, x3 reps    Ambulation/Gait Ambulation/Gait assistance: Mod assist Gait Distance (Feet): 2 Feet Assistive device: 1 person hand held assist Gait Pattern/deviations: Step-to pattern, Decreased step length - right, Decreased step length - left, Decreased stride length, Decreased weight shift to right, Decreased weight  shift to left, Leaning posteriorly, Knees buckling Gait velocity: reduced Gait  velocity interpretation: <1.31 ft/sec, indicative of household ambulator   General Gait Details: Pt with slow, small lateral steps to R along edge of stretcher, displaying knee instability and need for intermittent knee block. Needs cues for weight shifing to advance legs. ModA for weight shifting, blocking knees, and preventing LOB with noted posterior lean.  Stairs            Wheelchair Mobility    Modified Rankin (Stroke Patients Only) Modified Rankin (Stroke Patients Only) Pre-Morbid Rankin Score: Slight disability Modified Rankin: Moderately severe disability     Balance Overall balance assessment: Needs assistance Sitting-balance support: Bilateral upper extremity supported, Feet supported Sitting balance-Leahy Scale: Poor Sitting balance - Comments: UE support and minA to sit EOB Postural control: Posterior lean Standing balance support: Bilateral upper extremity supported, During functional activity Standing balance-Leahy Scale: Poor Standing balance comment: Reliant on UE support and external physical assistance, noted posterior lean                             Pertinent Vitals/Pain Pain Assessment Pain Assessment: Faces Faces Pain Scale: Hurts even more Pain Location: back and sacral wounds Pain Descriptors / Indicators: Discomfort, Grimacing, Guarding Pain Intervention(s): Limited activity within patient's tolerance, Monitored during session, Repositioned    Home Living Family/patient expects to be discharged to:: Private residence Living Arrangements: Alone Available Help at Discharge: Other (Comment) (unknown) Type of Home: House Home Access: Stairs to enter Entrance Stairs-Rails: Right (ascending) Entrance Stairs-Number of Steps: 1   Home Layout: One level Home Equipment: Cane - quad;BSC/3in1;Tub bench      Prior Function Prior Level of Function : Independent/Modified Independent             Mobility Comments: Mod I with quad cane        Hand Dominance        Extremity/Trunk Assessment   Upper Extremity Assessment Upper Extremity Assessment: Defer to OT evaluation    Lower Extremity Assessment Lower Extremity Assessment: Generalized weakness;Difficult to assess due to impaired cognition (erythema noted around lateral hips and sacral wound)    Cervical / Trunk Assessment Cervical / Trunk Assessment: Other exceptions Cervical / Trunk Exceptions: erythema and skin inegrity issues noted along back and pressure wound noted at sacrum  Communication   Communication: No difficulties  Cognition Arousal/Alertness: Awake/alert Behavior During Therapy: Flat affect Overall Cognitive Status: Impaired/Different from baseline Area of Impairment: Orientation, Memory, Attention, Following commands, Safety/judgement, Problem solving, Awareness                 Orientation Level: Disoriented to, Place, Time, Situation Current Attention Level: Sustained Memory: Decreased short-term memory Following Commands: Follows one step commands inconsistently, Follows one step commands with increased time Safety/Judgement: Decreased awareness of safety, Decreased awareness of deficits Awareness: Intellectual Problem Solving: Slow processing, Decreased initiation, Difficulty sequencing, Requires verbal cues, Requires tactile cues General Comments: Pt was not sure of location, but was able to identify that it was October but not what holiday was coming up, and reported she fell 3 months ago. Pt also perseverating on some words or answers even though question changed. Follows simple cues ~65% of time with repetition. Requires cues for sequencing and extra time to process        General Comments General comments (skin integrity, edema, etc.): requested air mattress bed to be ordered and coordinated with RN in regards to  having hydrotherapy consulted if desired by MD    Exercises     Assessment/Plan    PT Assessment Patient needs  continued PT services  PT Problem List Decreased strength;Decreased activity tolerance;Decreased balance;Decreased mobility;Decreased cognition;Decreased safety awareness;Decreased skin integrity;Pain       PT Treatment Interventions DME instruction;Gait training;Stair training;Functional mobility training;Therapeutic activities;Therapeutic exercise;Balance training;Neuromuscular re-education;Patient/family education;Cognitive remediation    PT Goals (Current goals can be found in the Care Plan section)  Acute Rehab PT Goals Patient Stated Goal: to not hurt PT Goal Formulation: With patient Time For Goal Achievement: 08/21/22 Potential to Achieve Goals: Good    Frequency Min 3X/week     Co-evaluation               AM-PAC PT "6 Clicks" Mobility  Outcome Measure Help needed turning from your back to your side while in a flat bed without using bedrails?: A Lot Help needed moving from lying on your back to sitting on the side of a flat bed without using bedrails?: A Lot Help needed moving to and from a bed to a chair (including a wheelchair)?: A Lot Help needed standing up from a chair using your arms (e.g., wheelchair or bedside chair)?: A Lot Help needed to walk in hospital room?: Total Help needed climbing 3-5 steps with a railing? : Total 6 Click Score: 10    End of Session   Activity Tolerance: Patient limited by pain;Patient tolerated treatment well Patient left: in bed;with call bell/phone within reach;Other (comment) (rolled to L) Nurse Communication: Mobility status;Other (comment) (wounds, consult hydro if desired by MD, order air mattress) PT Visit Diagnosis: Unsteadiness on feet (R26.81);Other abnormalities of gait and mobility (R26.89);Muscle weakness (generalized) (M62.81);History of falling (Z91.81);Difficulty in walking, not elsewhere classified (R26.2);Pain Pain - part of body:  (sacrum)    Time: 2244-9753 PT Time Calculation (min) (ACUTE ONLY): 29  min   Charges:   PT Evaluation $PT Eval Moderate Complexity: 1 Mod PT Treatments $Therapeutic Activity: 8-22 mins        Raymond Gurney, PT, DPT Acute Rehabilitation Services  Office: [phone]   Jewel Baize 08/07/2022, 2:25 PM

## 2022-08-07 NOTE — ED Notes (Signed)
Patient more alert at this time.  Able to recall year and events of today.  States she remembers sitting down to watch TV and then someone was knocking on door.  Mittens removed from hands and patient provided with sandwich and water per her verbal request.  Made aware that she is having surgery in the morning and voiced understanding.

## 2022-08-07 NOTE — ED Notes (Signed)
Patient in MRI 

## 2022-08-07 NOTE — Progress Notes (Signed)
EEG complete - results pending 

## 2022-08-07 NOTE — H&P (Addendum)
History and Physical    Connie Wilson NOM:[id number] DOB: 02/02/1956 DOA: 08/06/2022  PCP: Paschal Dopp, PA  Patient coming from: home  I have personally briefly reviewed patient's old medical records in [facility]  Chief Complaint: found down at home with change in mental status  HPI: Connie Wilson is a 66 y.o. female with medical history significant of DMII, HTN, TIA depression, anxiety ,recurrent UTI who presents to ED after being found down at home.  Per family patient lives alone and as last seen around one week ago. Per notes on arrival to scene ems noted that patient was confused and soiled with urine and feces. Patient also at that time has complaints of right sided back pain. Of note due to patient altered mental state she is not able to give history. Per family it appears patient fell in the bathroom and  then crawled from the bathroom to the bedroom.   ED Course:  Vitals: 98.3, bp 103/85, rr 26, hr 123 Labs: Wbc 27.8, hgb 16.9, pmn 24.2 plt 301 Etoh< 10 Covid ;neg Ammonia 16 NA 145, K 4.1 , cr 1.14 (0.82), calcium 10.5 CK417 Ce21 Lactic:2 UA +ketones, rare bacteria  Utox: neg CTH:  1. No acute intracranial hemorrhage. 2. Atrophy with chronic microvascular ischemic changes. 3. Ex vacuo dilatation of the ventricles with mild ventriculomegaly, may be associated with normal pressure hydrocephalus. Comparison with older imaging studies or MRI is recommended for follow-up. 4. Degenerative changes in the cervical spine without evidence of acute fracture. CT cervical spine IMPRESSION: 1. No acute intracranial hemorrhage. 2. Atrophy with chronic microvascular ischemic changes. 3. Ex vacuo dilatation of the ventricles with mild ventriculomegaly, may be associated with normal pressure hydrocephalus. Comparison with older imaging studies or MRI is recommended for follow-up. 4. Degenerative changes in the cervical spine without evidence of acute  fracture.  Cxr: NAd Tx : zosyn/vanc, 1L NS Review of Systems: As per HPI otherwise 10 point review of systems negative.   Past Medical History:  Diagnosis Date   Allergy    Anxiety    Arthritis    Cataract    Bilateral eyes, has not needed surgery yet   Depression    Diabetes mellitus without complication (HCC)    Fracture of elbow    right   Hyperlipidemia    Hypertension     Past Surgical History:  Procedure Laterality Date   FRACTURE SURGERY     fx. R ankle     TUBAL LIGATION       reports that she quit smoking about 40 years ago. Her smoking use included cigarettes. She has never used smokeless tobacco. She reports that she does not drink alcohol and does not use drugs.  Allergies  Allergen Reactions   Clindamycin/Lincomycin     Pt report C-diff   Penicillins Rash    Has patient had a PCN reaction causing immediate rash, facial/tongue/throat swelling, SOB or lightheadedness with hypotension: No (pt  Did have rash) Has patient had a PCN reaction causing severe rash involving mucus membranes or skin necrosis: No Has patient had a PCN reaction that required hospitalization: No Has patient had a PCN reaction occurring within the last 10 years:No If all of the above answers are "NO", then may proceed with Cephalosporin use.     Family History  Problem Relation Age of Onset   Ovarian cancer Mother    Cancer - Ovarian Mother    Heart failure Father    Colon cancer Father  Hypertension Father    Prostate cancer Father    Hypertension Sister    Hyperlipidemia Sister    Cancer Sister        unknown type- bladder, liver and lungs   Heart attack Maternal Grandfather    Diabetes Paternal Grandmother    Stomach cancer Neg Hx    Breast cancer Neg Hx    Esophageal cancer Neg Hx    Rectal cancer Neg Hx     Prior to Admission medications   Medication Sig Start Date End Date Taking? Authorizing Provider  cloNIDine (CATAPRES) 0.2 MG tablet Take 0.2 mg by mouth 2  (two) times daily. 11/02/21  Yes [provider]  lisinopril (ZESTRIL) 20 MG tablet Take 1 tablet by mouth once daily 03/25/22  Yes Hochrein, Fayrene Fearing, MD  Ascorbic Acid (VITAMIN C) 1000 MG tablet Take 1,000 mg by mouth daily.    [provider]  calcium citrate (CALCITRATE - DOSED IN MG ELEMENTAL CALCIUM) 950 MG tablet Take 1 tablet by mouth 2 (two) times daily.    [provider]  citalopram (CELEXA) 20 MG tablet Take 1 tablet (20 mg total) by mouth daily. Patient taking differently: Take 40 mg by mouth daily. 07/08/21   Elenore Paddy, NP  cyclobenzaprine (FLEXERIL) 5 MG tablet Take 1 tablet (5 mg total) by mouth daily as needed for muscle spasms. Patient not taking: Reported on 11/16/2021 04/13/20   Robinson, [place] N, PA-C  fluticasone [facility]) 50 MCG/ACT nasal spray Place 2 sprays into both nostrils daily. 08/02/18   Olive Bass, FNP  metFORMIN (GLUCOPHAGE) 500 MG tablet Take 500 mg by mouth daily with breakfast.    [provider]  Multiple Vitamin (MULTIVITAMIN) tablet Take 1 tablet by mouth daily.    [provider]  Omega-3 Fatty Acids (FISH OIL) 1000 MG CAPS Take by mouth.    [provider]  OVER THE COUNTER MEDICATION Vitamin D 3 one capsule daily.    [provider]    Physical Exam: Vitals:   08/06/22 2230 08/06/22 2245 08/06/22 2300 08/06/22 2315  BP: (!) 155/88 (!) 163/87 (!) 146/101 (!) 158/122  Pulse: (!) 113 (!) 113 (!) 115 (!) 115  Resp: 20 (!) 23 (!) 23 (!) 24  Temp:      TempSrc:      SpO2: 97% 96% 94% 97%    Constitutional: NAD, calm, comfortable Vitals:   08/06/22 2230 08/06/22 2245 08/06/22 2300 08/06/22 2315  BP: (!) 155/88 (!) 163/87 (!) 146/101 (!) 158/122  Pulse: (!) 113 (!) 113 (!) 115 (!) 115  Resp: 20 (!) 23 (!) 23 (!) 24  Temp:      TempSrc:      SpO2: 97% 96% 94% 97%   Eyes: PERRL, lids and conjunctivae normal ENMT: Mucous membranes are moist. Posterior pharynx clear of any exudate  or lesions.Normal dentition.  Neck: normal, supple, no masses, no thyromegaly Respiratory: clear to auscultation bilaterally, no wheezing, no crackles. Normal respiratory effort. No accessory muscle use.  Cardiovascular: Regular rate and rhythm, no murmurs / rubs / gallops. No extremity edema. 2+ pedal pulses. No carotid bruits.  Abdomen: no tenderness, no masses palpated. No hepatosplenomegaly. Bowel sounds positive.  Musculoskeletal: no clubbing / cyanosis. No joint deformity upper and lower extremities. Good ROM, no contractures. Normal muscle tone.  Skin: back with large area or pressure injury worse in glutea region with formation of eschar. Neurologic: CN 2-12 grossly intact. Sensation intact, DTR normal. Strength 5/5 in all 4.  Psychiatric:confused  Alert and oriented x 3 Normal mood.    Labs on Admission: I have personally reviewed following labs and imaging studies  CBC: Recent Labs  Lab 08/06/22 2058  WBC 27.8*  NEUTROABS 24.2*  HGB 16.9*  HCT 50.6*  MCV 84.6  PLT 301   Basic Metabolic Panel: Recent Labs  Lab 08/06/22 2058  NA 145  K 4.1  CL 105  CO2 25  GLUCOSE 163*  BUN 49*  CREATININE 1.14*  CALCIUM 10.5*   GFR: CrCl cannot be calculated (Unknown ideal weight.). Liver Function Tests: Recent Labs  Lab 08/06/22 2058  AST 36  ALT 30  ALKPHOS 103  BILITOT 1.2  PROT 7.9  ALBUMIN 3.0*   No results for input(s): "LIPASE", "AMYLASE" in the last 168 hours. Recent Labs  Lab 08/06/22 2058  AMMONIA 16   Coagulation Profile: No results for input(s): "INR", "PROTIME" in the last 168 hours. Cardiac Enzymes: Recent Labs  Lab 08/06/22 2058  CKTOTAL 417*   BNP (last 3 results) No results for input(s): "PROBNP" in the last 8760 hours. HbA1C: No results for input(s): "HGBA1C" in the last 72 hours. CBG: No results for input(s): "GLUCAP" in the last 168 hours. Lipid Profile: No results for input(s): "CHOL", "HDL", "LDLCALC", "TRIG", "CHOLHDL", "LDLDIRECT"  in the last 72 hours. Thyroid Function Tests: No results for input(s): "TSH", "T4TOTAL", "FREET4", "T3FREE", "THYROIDAB" in the last 72 hours. Anemia Panel: No results for input(s): "VITAMINB12", "FOLATE", "FERRITIN", "TIBC", "IRON", "RETICCTPCT" in the last 72 hours. Urine analysis:    Component Value Date/Time   COLORURINE YELLOW 08/06/2022 2347   APPEARANCEUR TURBID (A) 08/06/2022 2347   LABSPEC 1.027 08/06/2022 2347   PHURINE 5.0 08/06/2022 2347   GLUCOSEU NEGATIVE 08/06/2022 2347   HGBUR NEGATIVE 08/06/2022 2347   BILIRUBINUR NEGATIVE 08/06/2022 2347   BILIRUBINUR negative 12/06/2015 0848   BILIRUBINUR neg 02/23/2013 0904   KETONESUR 20 (A) 08/06/2022 2347   PROTEINUR NEGATIVE 08/06/2022 2347   UROBILINOGEN 0.2 12/06/2015 0848   UROBILINOGEN 0.2 12/25/2009 1310   NITRITE NEGATIVE 08/06/2022 2347   LEUKOCYTESUR NEGATIVE 08/06/2022 2347    Radiological Exams on Admission: DG Chest 2 View  Result Date: 08/06/2022 CLINICAL DATA:  Altered mental status, concern for sepsis or pneumonia. EXAM: CHEST - 2 VIEW COMPARISON:  11/10/2021. FINDINGS: The heart size and mediastinal contours are within normal limits. No consolidation, effusion, or pneumothorax. Degenerative changes in the thoracic spine. No acute abnormality. IMPRESSION: No active cardiopulmonary disease. Electronically Signed   By: Thornell SartoriusLaura  Taylor M.D.   On: 08/06/2022 23:11   CT Head Wo Contrast  Result Date: 08/06/2022 CLINICAL DATA:  Polytrauma, blunt.  Fall with mental status change. EXAM: CT HEAD WITHOUT CONTRAST CT CERVICAL SPINE WITHOUT CONTRAST TECHNIQUE: Multidetector CT imaging of the head and cervical spine was performed following the standard protocol without intravenous contrast. Multiplanar CT image reconstructions of the cervical spine were also generated. RADIATION DOSE REDUCTION: This exam was performed according to the departmental dose-optimization program which includes automated exposure control, adjustment  of the mA and/or kV according to patient size and/or use of iterative reconstruction technique. COMPARISON:  01/12/2016. FINDINGS: CT HEAD FINDINGS Brain: No acute intracranial hemorrhage, midline shift or mass effect. No extra-axial fluid collection. Diffuse atrophy is noted. Periventricular white matter hypodensities are present bilaterally. There is ex vacuo dilatation of the ventricles with mild ventriculomegaly. Old lacunar infarcts are present in the basal ganglia. Vascular: No hyperdense vessel or unexpected calcification. Skull: Normal. Negative for fracture or focal  lesion. Sinuses/Orbits: Mild mucosal thickening in the left maxillary sinus. No acute orbital abnormality. Other: None. CT CERVICAL SPINE FINDINGS Alignment: Normal. Skull base and vertebrae: No acute fracture. No primary bone lesion or focal pathologic process. Soft tissues and spinal canal: No prevertebral fluid or swelling. No visible canal hematoma. Disc levels: Multilevel intervertebral disc space narrowing, degenerative endplate changes, and facet arthropathy. Upper chest: No acute abnormality. Other: None. IMPRESSION: 1. No acute intracranial hemorrhage. 2. Atrophy with chronic microvascular ischemic changes. 3. Ex vacuo dilatation of the ventricles with mild ventriculomegaly, may be associated with normal pressure hydrocephalus. Comparison with older imaging studies or MRI is recommended for follow-up. 4. Degenerative changes in the cervical spine without evidence of acute fracture. Electronically Signed   By: Thornell Sartorius M.D.   On: 08/06/2022 21:49   CT Cervical Spine Wo Contrast  Result Date: 08/06/2022 CLINICAL DATA:  Polytrauma, blunt.  Fall with mental status change. EXAM: CT HEAD WITHOUT CONTRAST CT CERVICAL SPINE WITHOUT CONTRAST TECHNIQUE: Multidetector CT imaging of the head and cervical spine was performed following the standard protocol without intravenous contrast. Multiplanar CT image reconstructions of the cervical  spine were also generated. RADIATION DOSE REDUCTION: This exam was performed according to the departmental dose-optimization program which includes automated exposure control, adjustment of the mA and/or kV according to patient size and/or use of iterative reconstruction technique. COMPARISON:  01/12/2016. FINDINGS: CT HEAD FINDINGS Brain: No acute intracranial hemorrhage, midline shift or mass effect. No extra-axial fluid collection. Diffuse atrophy is noted. Periventricular white matter hypodensities are present bilaterally. There is ex vacuo dilatation of the ventricles with mild ventriculomegaly. Old lacunar infarcts are present in the basal ganglia. Vascular: No hyperdense vessel or unexpected calcification. Skull: Normal. Negative for fracture or focal lesion. Sinuses/Orbits: Mild mucosal thickening in the left maxillary sinus. No acute orbital abnormality. Other: None. CT CERVICAL SPINE FINDINGS Alignment: Normal. Skull base and vertebrae: No acute fracture. No primary bone lesion or focal pathologic process. Soft tissues and spinal canal: No prevertebral fluid or swelling. No visible canal hematoma. Disc levels: Multilevel intervertebral disc space narrowing, degenerative endplate changes, and facet arthropathy. Upper chest: No acute abnormality. Other: None. IMPRESSION: 1. No acute intracranial hemorrhage. 2. Atrophy with chronic microvascular ischemic changes. 3. Ex vacuo dilatation of the ventricles with mild ventriculomegaly, may be associated with normal pressure hydrocephalus. Comparison with older imaging studies or MRI is recommended for follow-up. 4. Degenerative changes in the cervical spine without evidence of acute fracture. Electronically Signed   By: Thornell Sartorius M.D.   On: 08/06/2022 21:49    EKG: Independently reviewed. See above   Assessment/Plan  Acute metabolic Encephalopathy associated with Sepsis  -presumed source skin vs GU vs GI -patient found down and has multiple open wounds  and was note to be soiled with urine and feces  -hx of recurrent UTI -f/u culture data (urine, gi panel ) -s/p goal directed ivfs in ED -lactic on arrival 2 , continue to monitor  - change in mental status presumed due to sepsis , CTH negative  -to be complete will f/u with MRI head  -ct abdomen insetting of tenderness on exam and hx suggestive of gastroenteritis  -continue on neuro checks  -continue on broad spectrum antibiotics   Fall with prolonged immobilization  -insetting of sepsis presumed gi/skin/gi source -ck 400's -PT/OT to see  -repeat CK in am s/p ivfs   Pressure Injury  -large are on back worse in gluteal region  -wound care to see  -  off loading /pressure ulcer protocol   DMII, non-insulin dependent -monitor on is/fs  -hold oral metformin  HTN -stable , elevated  -continue to monitor  -resume oral once patient has ability to tolerate po    Hx TIA -resume secondary ppx    Depression  Anxiety  -resume home regimen     DVT prophylaxis: heparin Code Status: full Family Communication: Fleming,April (Daughter)  [phone] (Mobile) Disposition Plan: patient  expected to be admitted greater than 2 midnights  Consults called: n/a Admission status: progressive   Clance Boll MD [facility]   If 7PM-7AM, please contact night-coverage [email or web address] Password [facility]  08/07/2022, 12:23 AM

## 2022-08-07 NOTE — Progress Notes (Signed)
No charge note  Patient seen and examined this morning, admitted overnight, H&P reviewed and agree with the assessment and plan.   66 year old female with DM2, HTN, TIA, depression, anxiety, recurrent UTIs who presents to the ED after being found down at home.  She lives alone, was last seen about a week ago, she was confused upon EMS arrival, soiled with urine and feces.  Acute metabolic encephalopathy, presumed with an infection source given elevated WBC.  She has been placed on broad-spectrum antibiotics.  Not really answering my questions or cooperating with exam.  We will try to reassess her back wounds later on today  Scheduled Meds:  heparin  5,000 Units Subcutaneous Q8H   Continuous Infusions:  sodium chloride 125 mL/hr at 08/07/22 0242   piperacillin-tazobactam (ZOSYN)  IV 3.375 g (08/07/22 0731)   vancomycin     PRN Meds:.acetaminophen **OR** acetaminophen, albuterol, ondansetron **OR** ondansetron (ZOFRAN) IV  Kadarius Cuffe M. Cruzita Lederer, MD, PhD [facility]  Between 7 am - 7 pm you can contact me via Amion (for emergencies) or [place] (non urgent matters).  I am not available 7 pm - 7 am, please contact night coverage MD/APP via Amion

## 2022-08-08 ENCOUNTER — Inpatient Hospital Stay (HOSPITAL_COMMUNITY): Payer: Medicare HMO

## 2022-08-08 ENCOUNTER — Encounter (HOSPITAL_COMMUNITY): Payer: Self-pay | Admitting: Internal Medicine

## 2022-08-08 DIAGNOSIS — R55 Syncope and collapse: Secondary | ICD-10-CM | POA: Diagnosis not present

## 2022-08-08 DIAGNOSIS — G9341 Metabolic encephalopathy: Secondary | ICD-10-CM

## 2022-08-08 DIAGNOSIS — L03312 Cellulitis of back [any part except buttock]: Secondary | ICD-10-CM

## 2022-08-08 DIAGNOSIS — A419 Sepsis, unspecified organism: Secondary | ICD-10-CM | POA: Diagnosis not present

## 2022-08-08 LAB — COMPREHENSIVE METABOLIC PANEL
ALT: 29 U/L (ref 0–44)
AST: 32 U/L (ref 15–41)
Albumin: 2.1 g/dL — ABNORMAL LOW (ref 3.5–5.0)
Alkaline Phosphatase: 74 U/L (ref 38–126)
Anion gap: 8 (ref 5–15)
BUN: 22 mg/dL (ref 8–23)
CO2: 21 mmol/L — ABNORMAL LOW (ref 22–32)
Calcium: 8.4 mg/dL — ABNORMAL LOW (ref 8.9–10.3)
Chloride: 110 mmol/L (ref 98–111)
Creatinine, Ser: 0.84 mg/dL (ref 0.44–1.00)
GFR, Estimated: >60 mL/min (ref >60)
Glucose, Bld: 95 mg/dL (ref 70–99)
Potassium: 3 mmol/L — ABNORMAL LOW (ref 3.5–5.1)
Sodium: 139 mmol/L (ref 135–145)
Total Bilirubin: 0.8 mg/dL (ref 0.3–1.2)
Total Protein: 5.4 g/dL — ABNORMAL LOW (ref 6.5–8.1)

## 2022-08-08 LAB — URINE CULTURE

## 2022-08-08 LAB — LIPID PANEL
Cholesterol: 156 mg/dL (ref 0–200)
HDL: 26 mg/dL — ABNORMAL LOW (ref >40)
LDL Cholesterol: 98 mg/dL (ref 0–99)
Total CHOL/HDL Ratio: 6 RATIO
Triglycerides: 159 mg/dL — ABNORMAL HIGH (ref <150)
VLDL: 32 mg/dL (ref 0–40)

## 2022-08-08 LAB — HEMOGLOBIN A1C
Hgb A1c MFr Bld: 6.2 % — ABNORMAL HIGH (ref 4.8–5.6)
Mean Plasma Glucose: 131.24 mg/dL

## 2022-08-08 LAB — CBC
HCT: 35.5 % — ABNORMAL LOW (ref 36.0–46.0)
Hemoglobin: 11.9 g/dL — ABNORMAL LOW (ref 12.0–15.0)
MCH: 28.3 pg (ref 26.0–34.0)
MCHC: 33.5 g/dL (ref 30.0–36.0)
MCV: 84.5 fL (ref 80.0–100.0)
Platelets: 202 10*3/uL (ref 150–400)
RBC: 4.2 MIL/uL (ref 3.87–5.11)
RDW: 15 % (ref 11.5–15.5)
WBC: 15.2 10*3/uL — ABNORMAL HIGH (ref 4.0–10.5)
nRBC: 0 % (ref 0.0–0.2)

## 2022-08-08 LAB — CBG MONITORING, ED
Glucose-Capillary: 97 mg/dL (ref 70–99)
Glucose-Capillary: 102 mg/dL — ABNORMAL HIGH (ref 70–99)

## 2022-08-08 LAB — MAGNESIUM: Magnesium: 1.6 mg/dL — ABNORMAL LOW (ref 1.7–2.4)

## 2022-08-08 LAB — ECHOCARDIOGRAM COMPLETE
Area-P 1/2: 4.49 cm2
S' Lateral: 3.2 cm
Single Plane A2C EF: 51.5 %

## 2022-08-08 LAB — GLUCOSE, CAPILLARY
Glucose-Capillary: 100 mg/dL — ABNORMAL HIGH (ref 70–99)
Glucose-Capillary: 140 mg/dL — ABNORMAL HIGH (ref 70–99)
Glucose-Capillary: 104 mg/dL — ABNORMAL HIGH (ref 70–99)

## 2022-08-08 MED ORDER — POTASSIUM CHLORIDE CRYS ER 20 MEQ PO TBCR
30.0000 meq | EXTENDED_RELEASE_TABLET | Freq: Once | ORAL | Status: AC
  Administered 2022-08-08: 30 meq via ORAL
  Filled 2022-08-08: qty 1

## 2022-08-08 MED ORDER — INSULIN ASPART 100 UNIT/ML IJ SOLN
0.0000 [IU] | Freq: Three times a day (TID) | INTRAMUSCULAR | Status: DC
  Administered 2022-08-08: 2 [IU] via SUBCUTANEOUS
  Administered 2022-08-09: 3 [IU] via SUBCUTANEOUS
  Administered 2022-08-10: 1 [IU] via SUBCUTANEOUS
  Administered 2022-08-10: 2 [IU] via SUBCUTANEOUS
  Administered 2022-08-11: 1 [IU] via SUBCUTANEOUS
  Administered 2022-08-13: 2 [IU] via SUBCUTANEOUS
  Administered 2022-08-13: 1 [IU] via SUBCUTANEOUS
  Administered 2022-08-14: 2 [IU] via SUBCUTANEOUS
  Administered 2022-08-14 – 2022-08-15 (×5): 1 [IU] via SUBCUTANEOUS
  Administered 2022-08-16: 2 [IU] via SUBCUTANEOUS
  Administered 2022-08-16 (×2): 1 [IU] via SUBCUTANEOUS

## 2022-08-08 MED ORDER — CITALOPRAM HYDROBROMIDE 20 MG PO TABS
40.0000 mg | ORAL_TABLET | Freq: Every day | ORAL | Status: DC
  Administered 2022-08-08 – 2022-08-17 (×9): 40 mg via ORAL
  Filled 2022-08-08: qty 2
  Filled 2022-08-08: qty 4
  Filled 2022-08-08 (×8): qty 2

## 2022-08-08 MED ORDER — POTASSIUM CHLORIDE CRYS ER 20 MEQ PO TBCR
40.0000 meq | EXTENDED_RELEASE_TABLET | Freq: Once | ORAL | Status: AC
  Administered 2022-08-08: 40 meq via ORAL
  Filled 2022-08-08: qty 2

## 2022-08-08 MED ORDER — ASPIRIN 81 MG PO CHEW
81.0000 mg | CHEWABLE_TABLET | Freq: Every day | ORAL | Status: DC
  Administered 2022-08-08 – 2022-08-17 (×9): 81 mg via ORAL
  Filled 2022-08-08 (×9): qty 1

## 2022-08-08 MED ORDER — MAGNESIUM SULFATE 2 GM/50ML IV SOLN
2.0000 g | Freq: Once | INTRAVENOUS | Status: AC
  Administered 2022-08-08: 2 g via INTRAVENOUS
  Filled 2022-08-08: qty 50

## 2022-08-08 NOTE — ED Notes (Signed)
Spoke with daughter York Cerise and gave her an update per pts request. Daughter said from their phone call pt sounds more alert and oriented since she last spoke to her yesterday and seems to be improving

## 2022-08-08 NOTE — Consult Note (Signed)
[facility] Surgery Consult Note  Keonna Raether 1956/02/21  [id number].    Requesting MD: Pamella Pert Chief Complaint/Reason for Consult: gluteal wound  HPI:  Raquel Racey is a 66 y.o. female PMH HTN and DM who was admitted to [facility] yesterday after being found down at home. She lives alone, last seen by family about 1 week prior. Upon EMS arrival she was confused, soiled with urine and feces. Patient was admitted to the medical service and started on broad spectrum IV antibiotics. Per report cognition seems to be improving but patient does not recall events leading up to admission. She was found to have a gluteal wound. CT abdomen/pelvis negative for underlying abscess. General surgery asked to see.    Family History  Problem Relation Age of Onset   Ovarian cancer Mother    Cancer - Ovarian Mother    Heart failure Father    Colon cancer Father    Hypertension Father    Prostate cancer Father    Hypertension Sister    Hyperlipidemia Sister    Cancer Sister        unknown type- bladder, liver and lungs   Heart attack Maternal Grandfather    Diabetes Paternal Grandmother    Stomach cancer Neg Hx    Breast cancer Neg Hx    Esophageal cancer Neg Hx    Rectal cancer Neg Hx     Past Medical History:  Diagnosis Date   Allergy    Anxiety    Arthritis    Cataract    Bilateral eyes, has not needed surgery yet   Depression    Diabetes mellitus without complication (HCC)    Fracture of elbow    right   Hyperlipidemia    Hypertension     Past Surgical History:  Procedure Laterality Date   FRACTURE SURGERY     fx. R ankle     TUBAL LIGATION      Social History:  reports that she quit smoking about 40 years ago. Her smoking use included cigarettes. She has never used smokeless tobacco. She reports that she does not drink alcohol and does not use drugs.  Allergies:  Allergies  Allergen Reactions   Clindamycin/Lincomycin     Pt report C-diff   Penicillins Rash     Tolerated zosyn 08/08/2022 Has patient had a PCN reaction causing immediate rash, facial/tongue/throat swelling, SOB or lightheadedness with hypotension: No (pt  Did have rash) Has patient had a PCN reaction causing severe rash involving mucus membranes or skin necrosis: No Has patient had a PCN reaction that required hospitalization: No Has patient had a PCN reaction occurring within the last 10 years:No If all of the above answers are "NO", then may proceed with Cephalosporin use.     (Not in a hospital admission)   Prior to Admission medications   Medication Sig Start Date End Date Taking? Authorizing Provider  cloNIDine (CATAPRES) 0.2 MG tablet Take 0.2 mg by mouth 2 (two) times daily. 11/02/21  Yes [provider]  lisinopril (ZESTRIL) 20 MG tablet Take 1 tablet by mouth once daily 03/25/22  Yes Hochrein, Fayrene Fearing, MD  pantoprazole (PROTONIX) 20 MG tablet Take 20 mg by mouth daily.   Yes [provider]  Ascorbic Acid (VITAMIN C) 1000 MG tablet Take 1,000 mg by mouth daily.    [provider]  calcium citrate (CALCITRATE - DOSED IN MG ELEMENTAL CALCIUM) 950 MG tablet Take 1 tablet by mouth 2 (two) times daily.    [provider]  citalopram (CELEXA) 20 MG tablet Take 1 tablet (20 mg total) by mouth daily. Patient taking differently: Take 40 mg by mouth daily. 07/08/21   Ailene Ards, NP  metFORMIN (GLUCOPHAGE) 500 MG tablet Take 500 mg by mouth daily with breakfast.    [provider]  Multiple Vitamin (MULTIVITAMIN) tablet Take 1 tablet by mouth daily.    [provider]  Omega-3 Fatty Acids (FISH OIL) 1000 MG CAPS Take by mouth.    [provider]  OVER THE COUNTER MEDICATION Vitamin D 3 one capsule daily.    [provider]    Blood pressure (!) 142/92, pulse 88, temperature (!) 97.5 F (36.4 C), temperature source Oral, resp. rate 16, SpO2 100 %. Physical Exam: General: WD/WN female who is laying in bed in  NAD HEENT: head is normocephalic, atraumatic.  Sclera are noninjected.  Pupils equal and round.  Ears and nose without any masses or lesions.  Mouth is pink and moist. Dentition fair Heart: RRR Lungs: CTAB, no wheezes, rhonchi, or rales noted.  Respiratory effort nonlabored on room air Abd: soft, NT/ND, +BS, no masses, hernias, or organomegaly MS: no BUE/BLE edema, calves soft and nontender Skin: warm and dry with no masses, lesions, or rashes Psych: Alert, oriented to self and location, states that year is 2013, does not remember days leading up to admission Neuro: MAEs, no gross motor or sensory deficits BUE/BLE GU: bilateral gluteal wound pictured below with hard, thick leathery overlying tissue, surrounding skin somewhat erythematous, no palpable fluctuance, no active drainage    Results for orders placed or performed during the hospital encounter of 08/06/22 (from the past 48 hour(s))  Comprehensive metabolic panel     Status: Abnormal   Collection Time: 08/06/22  8:58 PM  Result Value Ref Range   Sodium 145 135 - 145 mmol/L   Potassium 4.1 3.5 - 5.1 mmol/L    Comment: HEMOLYSIS AT THIS LEVEL MAY AFFECT RESULT   Chloride 105 98 - 111 mmol/L   CO2 25 22 - 32 mmol/L   Glucose, Bld 163 (H) 70 - 99 mg/dL    Comment: Glucose reference range applies only to samples taken after fasting for at least 8 hours.   BUN 49 (H) 8 - 23 mg/dL   Creatinine, Ser 1.14 (H) 0.44 - 1.00 mg/dL   Calcium 10.5 (H) 8.9 - 10.3 mg/dL   Total Protein 7.9 6.5 - 8.1 g/dL   Albumin 3.0 (L) 3.5 - 5.0 g/dL   AST 36 15 - 41 U/L    Comment: HEMOLYSIS AT THIS LEVEL MAY AFFECT RESULT   ALT 30 0 - 44 U/L    Comment: HEMOLYSIS AT THIS LEVEL MAY AFFECT RESULT   Alkaline Phosphatase 103 38 - 126 U/L   Total Bilirubin 1.2 0.3 - 1.2 mg/dL    Comment: HEMOLYSIS AT THIS LEVEL MAY AFFECT RESULT   GFR, Estimated 53 (L) >60 mL/min    Comment: (NOTE) Calculated using the CKD-EPI Creatinine Equation (2021)    Anion gap 15  5 - 15    Comment: Performed at [facility], [place] [address], [place], [place] [address]  CBC with Differential     Status: Abnormal   Collection Time: 08/06/22  8:58 PM  Result Value Ref Range   WBC 27.8 (H) 4.0 - 10.5 K/uL   RBC 5.98 (H) 3.87 - 5.11 MIL/uL   Hemoglobin 16.9 (H) 12.0 - 15.0 g/dL   HCT 50.6 (H) 36.0 -  46.0 %   MCV 84.6 80.0 - 100.0 fL   MCH 28.3 26.0 - 34.0 pg   MCHC 33.4 30.0 - 36.0 g/dL   RDW 34.7 42.5 - 95.6 %   Platelets 301 150 - 400 K/uL   nRBC 0.0 0.0 - 0.2 %   Neutrophils Relative % 87 %   Neutro Abs 24.2 (H) 1.7 - 7.7 K/uL   Lymphocytes Relative 8 %   Lymphs Abs 2.2 0.7 - 4.0 K/uL   Monocytes Relative 5 %   Monocytes Absolute 1.4 (H) 0.1 - 1.0 K/uL   Eosinophils Relative 0 %   Eosinophils Absolute 0.0 0.0 - 0.5 K/uL   Basophils Relative 0 %   Basophils Absolute 0.0 0.0 - 0.1 K/uL   nRBC 0 0 /100 WBC   Abs Immature Granulocytes 0.00 0.00 - 0.07 K/uL   Tear Drop Cells PRESENT     Comment: Performed at [facility], [address], [place], [place] [address]  CK     Status: Abnormal   Collection Time: 08/06/22  8:58 PM  Result Value Ref Range   Total CK 417 (H) 38 - 234 U/L    Comment: HEMOLYSIS AT THIS LEVEL MAY AFFECT RESULT Performed at [facility], [address], [place], [place] [address]   Ethanol     Status: None   Collection Time: 08/06/22  8:58 PM  Result Value Ref Range   Alcohol, Ethyl (B) <10 <10 mg/dL    Comment: (NOTE) Lowest detectable limit for serum alcohol is 10 mg/dL.  For medical purposes only. Performed at [facility], [address], [place], [place] [address]   Ammonia     Status: None   Collection Time: 08/06/22  8:58 PM  Result Value Ref Range   Ammonia 16 9 - 35 umol/L    Comment: Performed at [facility], [address] [place], [place] [address]  SARS Coronavirus 2 by RT PCR (hospital order, performed in [facility] hospital lab) *cepheid single result test* Anterior Nasal Swab      Status: None   Collection Time: 08/06/22  8:58 PM   Specimen: Anterior Nasal Swab  Result Value Ref Range   SARS Coronavirus 2 by RT PCR NEGATIVE NEGATIVE    Comment: (NOTE) SARS-CoV-2 target nucleic acids are NOT DETECTED.  The SARS-CoV-2 RNA is generally detectable in upper and lower respiratory specimens during the acute phase of infection. The lowest concentration of SARS-CoV-2 viral copies this assay can detect is 250 copies / mL. A negative result does not preclude SARS-CoV-2 infection and should not be used as the sole basis for treatment or other patient management decisions.  A negative result may occur with improper specimen collection / handling, submission of specimen other than nasopharyngeal swab, presence of viral mutation(s) within the areas targeted by this assay, and inadequate number of viral copies (<250 copies / mL). A negative result must be combined with clinical observations, patient history, and epidemiological information.  Fact Sheet for Patients:   RoadLapTop.co.za  Fact Sheet for Healthcare Providers: http://kim-miller.com/  This test is not yet approved or  cleared by the [place] FDA and has been authorized for detection and/or diagnosis of SARS-CoV-2 by FDA under an Emergency Use Authorization (EUA).  This EUA will remain in effect (meaning this test can be used) for the duration of the COVID-19 declaration under Section 564(b)(1) of the Act, 21 U.S.C. section 360bbb-3(b)(1), unless the authorization is terminated or  revoked sooner.  Performed at [facility], [address], [place], [place] [address]   Troponin I (High Sensitivity)     Status: Abnormal   Collection Time: 08/06/22  8:58 PM  Result Value Ref Range   Troponin I (High Sensitivity) 21 (H) <18 ng/L    Comment: (NOTE) Elevated high sensitivity troponin I (hsTnI) values and significant  changes across serial measurements may  suggest ACS but many other  chronic and acute conditions are known to elevate hsTnI results.  Refer to the "Links" section for chest pain algorithms and additional  guidance. Performed at [facility], [address], [place], [place] [address]   Blood culture (routine x 2)     Status: None (Preliminary result)   Collection Time: 08/06/22  9:55 PM   Specimen: BLOOD  Result Value Ref Range   Specimen Description BLOOD LEFT ANTECUBITAL    Special Requests      BOTTLES DRAWN AEROBIC AND ANAEROBIC Blood Culture results may not be optimal due to an inadequate volume of blood received in culture bottles   Culture      NO GROWTH 2 DAYS Performed at [facility], [address], [place], [place] [address]    Report Status PENDING   Blood culture (routine x 2)     Status: None (Preliminary result)   Collection Time: 08/06/22 10:01 PM   Specimen: BLOOD RIGHT HAND  Result Value Ref Range   Specimen Description BLOOD RIGHT HAND    Special Requests      BOTTLES DRAWN AEROBIC AND ANAEROBIC Blood Culture adequate volume   Culture      NO GROWTH 2 DAYS Performed at [facility], [address], [place], [place] [address]    Report Status PENDING   Lactic acid, plasma     Status: Abnormal   Collection Time: 08/06/22 10:55 PM  Result Value Ref Range   Lactic Acid, Venous 2.0 (HH) 0.5 - 1.9 mmol/L    Comment: CRITICAL RESULT CALLED TO, READ BACK BY AND VERIFIED WITH Nicolasa Ducking RN 08/07/22 0004 Enid Derry Performed at [facility], [address] [place], [place] [address]   Troponin I (High Sensitivity)     Status: Abnormal   Collection Time: 08/06/22 10:55 PM  Result Value Ref Range   Troponin I (High Sensitivity) 18 (H) <18 ng/L    Comment: (NOTE) Elevated high sensitivity troponin I (hsTnI) values and significant  changes across serial measurements may suggest ACS but many other  chronic and acute conditions are known to elevate hsTnI results.  Refer to  the "Links" section for chest pain algorithms and additional  guidance. Performed at [facility], [address], [place], [place] [address]   Urinalysis, Routine w reflex microscopic Urine, Clean Catch     Status: Abnormal   Collection Time: 08/06/22 11:47 PM  Result Value Ref Range   Color, Urine YELLOW YELLOW   APPearance TURBID (A) CLEAR   Specific Gravity, Urine 1.027 1.005 - 1.030   pH 5.0 5.0 - 8.0   Glucose, UA NEGATIVE NEGATIVE mg/dL   Hgb urine dipstick NEGATIVE NEGATIVE   Bilirubin Urine NEGATIVE NEGATIVE   Ketones, ur 20 (A) NEGATIVE mg/dL   Protein, ur NEGATIVE NEGATIVE mg/dL   Nitrite NEGATIVE NEGATIVE   Leukocytes,Ua NEGATIVE NEGATIVE   RBC / HPF 0-5 0 - 5 RBC/hpf   WBC, UA 0-5 0 - 5 WBC/hpf   Bacteria, UA RARE (A)  NONE SEEN   Amorphous Crystal PRESENT    Uric Acid Crys, UA PRESENT     Comment: Performed at [facility], [address] [place], [place] [address]  Rapid urine drug screen (hospital performed)     Status: None   Collection Time: 08/06/22 11:47 PM  Result Value Ref Range   Opiates NONE DETECTED NONE DETECTED   Cocaine NONE DETECTED NONE DETECTED   Benzodiazepines NONE DETECTED NONE DETECTED   Amphetamines NONE DETECTED NONE DETECTED   Tetrahydrocannabinol NONE DETECTED NONE DETECTED   Barbiturates NONE DETECTED NONE DETECTED    Comment: (NOTE) DRUG SCREEN FOR MEDICAL PURPOSES ONLY.  IF CONFIRMATION IS NEEDED FOR ANY PURPOSE, NOTIFY LAB WITHIN 5 DAYS.  LOWEST DETECTABLE LIMITS FOR URINE DRUG SCREEN Drug Class                     Cutoff (ng/mL) Amphetamine and metabolites    1000 Barbiturate and metabolites    200 Benzodiazepine                 200 Opiates and metabolites        300 Cocaine and metabolites        300 THC                            50 Performed at [facility], [address], [place], [place] [address]   Lactic acid, plasma     Status: Abnormal   Collection Time: 08/07/22  2:13 AM  Result Value Ref  Range   Lactic Acid, Venous 2.0 (HH) 0.5 - 1.9 mmol/L    Comment: CRITICAL VALUE NOTED. VALUE IS CONSISTENT WITH PREVIOUSLY REPORTED/CALLED VALUE Performed at [facility], [address] [place], [place] [address]   CBC     Status: Abnormal   Collection Time: 08/07/22  2:13 AM  Result Value Ref Range   WBC 28.0 (H) 4.0 - 10.5 K/uL   RBC 5.02 3.87 - 5.11 MIL/uL   Hemoglobin 14.6 12.0 - 15.0 g/dL   HCT 91.4 78.2 - 95.6 %   MCV 85.9 80.0 - 100.0 fL   MCH 29.1 26.0 - 34.0 pg   MCHC 33.9 30.0 - 36.0 g/dL   RDW 21.3 08.6 - 57.8 %   Platelets 238 150 - 400 K/uL   nRBC 0.0 0.0 - 0.2 %    Comment: Performed at [facility], [address] [place], [place] [address]  Creatinine, serum     Status: Abnormal   Collection Time: 08/07/22  2:13 AM  Result Value Ref Range   Creatinine, Ser 1.02 (H) 0.44 - 1.00 mg/dL   GFR, Estimated >95 >28 mL/min    Comment: (NOTE) Calculated using the CKD-EPI Creatinine Equation (2021) Performed at [facility], [address] [place], [place] [address]   CBC     Status: Abnormal   Collection Time: 08/07/22  5:22 AM  Result Value Ref Range   WBC 27.7 (H) 4.0 - 10.5 K/uL   RBC 5.14 (H) 3.87 - 5.11 MIL/uL   Hemoglobin 14.8 12.0 - 15.0 g/dL   HCT 40.1 (H) 02.7 - 25.3 %   MCV 90.3 80.0 - 100.0 fL   MCH 28.8 26.0 - 34.0 pg   MCHC 31.9 30.0 - 36.0 g/dL   RDW 66.4 40.3 - 47.4 %   Platelets 249 150 - 400 K/uL   nRBC 0.0  0.0 - 0.2 %    Comment: Performed at [facility], [address], [place], [place] [address]  Comprehensive metabolic panel     Status: Abnormal   Collection Time: 08/07/22  5:22 AM  Result Value Ref Range   Sodium 146 (H) 135 - 145 mmol/L   Potassium 3.8 3.5 - 5.1 mmol/L   Chloride 113 (H) 98 - 111 mmol/L   CO2 16 (L) 22 - 32 mmol/L   Glucose, Bld 143 (H) 70 - 99 mg/dL    Comment: Glucose reference range applies only to samples taken after fasting for at least 8 hours.   BUN 41 (H) 8 - 23 mg/dL   Creatinine,  Ser 6.04 0.44 - 1.00 mg/dL   Calcium 9.0 8.9 - 54.0 mg/dL   Total Protein 6.2 (L) 6.5 - 8.1 g/dL   Albumin 2.3 (L) 3.5 - 5.0 g/dL   AST 34 15 - 41 U/L   ALT 25 0 - 44 U/L   Alkaline Phosphatase 80 38 - 126 U/L   Total Bilirubin 1.1 0.3 - 1.2 mg/dL   GFR, Estimated >98 >11 mL/min    Comment: (NOTE) Calculated using the CKD-EPI Creatinine Equation (2021)    Anion gap 17 (H) 5 - 15    Comment: Performed at [facility], [address], [place], [place] [address]  CBG monitoring, ED     Status: Abnormal   Collection Time: 08/07/22  6:24 AM  Result Value Ref Range   Glucose-Capillary 177 (H) 70 - 99 mg/dL    Comment: Glucose reference range applies only to samples taken after fasting for at least 8 hours.  CBG monitoring, ED     Status: Abnormal   Collection Time: 08/07/22 12:04 PM  Result Value Ref Range   Glucose-Capillary 100 (H) 70 - 99 mg/dL    Comment: Glucose reference range applies only to samples taken after fasting for at least 8 hours.  CBG monitoring, ED     Status: Abnormal   Collection Time: 08/07/22  5:40 PM  Result Value Ref Range   Glucose-Capillary 119 (H) 70 - 99 mg/dL    Comment: Glucose reference range applies only to samples taken after fasting for at least 8 hours.  Comprehensive metabolic panel     Status: Abnormal   Collection Time: 08/08/22  4:33 AM  Result Value Ref Range   Sodium 139 135 - 145 mmol/L   Potassium 3.0 (L) 3.5 - 5.1 mmol/L   Chloride 110 98 - 111 mmol/L   CO2 21 (L) 22 - 32 mmol/L   Glucose, Bld 95 70 - 99 mg/dL    Comment: Glucose reference range applies only to samples taken after fasting for at least 8 hours.   BUN 22 8 - 23 mg/dL   Creatinine, Ser 2.95 0.44 - 1.00 mg/dL   Calcium 8.4 (L) 8.9 - 10.3 mg/dL   Total Protein 5.4 (L) 6.5 - 8.1 g/dL   Albumin 2.1 (L) 3.5 - 5.0 g/dL   AST 32 15 - 41 U/L   ALT 29 0 - 44 U/L   Alkaline Phosphatase 74 38 - 126 U/L   Total Bilirubin 0.8 0.3 - 1.2 mg/dL   GFR, Estimated >62 >13 mL/min     Comment: (NOTE) Calculated using the CKD-EPI Creatinine Equation (2021)    Anion gap 8 5 - 15    Comment: Performed at [facility], [address] [place], [place] [address]  CBC  Status: Abnormal   Collection Time: 08/08/22  4:33 AM  Result Value Ref Range   WBC 15.2 (H) 4.0 - 10.5 K/uL   RBC 4.20 3.87 - 5.11 MIL/uL   Hemoglobin 11.9 (L) 12.0 - 15.0 g/dL   HCT 32.3 (L) 55.7 - 32.2 %   MCV 84.5 80.0 - 100.0 fL   MCH 28.3 26.0 - 34.0 pg   MCHC 33.5 30.0 - 36.0 g/dL   RDW 02.5 42.7 - 06.2 %   Platelets 202 150 - 400 K/uL   nRBC 0.0 0.0 - 0.2 %    Comment: Performed at [facility], [address], [place], [place] [address]  Magnesium     Status: Abnormal   Collection Time: 08/08/22  4:33 AM  Result Value Ref Range   Magnesium 1.6 (L) 1.7 - 2.4 mg/dL    Comment: Performed at [facility], [address], [place], [place] [address]  CBG monitoring, ED     Status: None   Collection Time: 08/08/22  5:59 AM  Result Value Ref Range   Glucose-Capillary 97 70 - 99 mg/dL    Comment: Glucose reference range applies only to samples taken after fasting for at least 8 hours.  CBG monitoring, ED     Status: Abnormal   Collection Time: 08/08/22 12:03 PM  Result Value Ref Range   Glucose-Capillary 102 (H) 70 - 99 mg/dL    Comment: Glucose reference range applies only to samples taken after fasting for at least 8 hours.   ECHOCARDIOGRAM COMPLETE  Result Date: 08/08/2022    ECHOCARDIOGRAM REPORT   Patient Name:   KOBE JANSMA Date of Exam: 08/08/2022 Medical Rec #:  [id number]       Height:       68.0 in Accession #:    [id number]      Weight:       216.8 lb Date of Birth:  1955/12/27       BSA:          2.115 m Patient Age:    66 years        BP:           125/73 mmHg Patient Gender: F               HR:           108 bpm. Exam Location:  Inpatient Procedure: 2D Echo Indications:    syncope  History:        Patient has prior history of Echocardiogram examinations, most                  recent 11/30/2021. Risk Factors:Diabetes and Hypertension.  Sonographer:    Delcie Roch RDCS Referring Phys: [id number] SARA-MAIZ A THOMAS  Sonographer Comments: Image acquisition challenging due to patient body habitus. IMPRESSIONS  1. Left ventricular ejection fraction, by estimation, is 55%. The left ventricle has normal function. Left ventricular endocardial border not optimally defined to evaluate regional wall motion. Left ventricular diastolic parameters are consistent with Grade I diastolic dysfunction (impaired relaxation). There is abnormal septal motion secondary to conduction delay.  2. Right ventricular systolic function is mildly reduced particularly at the RV apex. The right ventricular size is normal. Tricuspid regurgitation signal is inadequate for assessing PA pressure.  3. The mitral valve is normal in structure. Trivial mitral valve regurgitation. No evidence of mitral stenosis.  4. The aortic valve is tricuspid. Aortic valve regurgitation is not visualized. No aortic stenosis is present.  5. The inferior vena cava is normal in size with greater than 50% respiratory variability, suggesting right atrial pressure of 3 mmHg. FINDINGS  Left Ventricle: Left ventricular ejection fraction, by estimation, is 55%. The left ventricle has normal function. Left ventricular endocardial border not optimally defined to evaluate regional wall motion. The left ventricular internal cavity size was normal in size. There is no left ventricular hypertrophy. Abnormal septal motion secondary to conduction delay. Left ventricular diastolic parameters are consistent with Grade I diastolic dysfunction (impaired relaxation). Right Ventricle: The right ventricular size is normal. No increase in right ventricular wall thickness. Right ventricular systolic function is mildly reduced. Tricuspid regurgitation signal is inadequate for assessing PA pressure. Left Atrium: Left atrial size was normal in size.  Right Atrium: Right atrial size was normal in size. Pericardium: There is no evidence of pericardial effusion. Mitral Valve: The mitral valve is normal in structure. Trivial mitral valve regurgitation. No evidence of mitral valve stenosis. Tricuspid Valve: The tricuspid valve is normal in structure. Tricuspid valve regurgitation is trivial. No evidence of tricuspid stenosis. Aortic Valve: The aortic valve is tricuspid. Aortic valve regurgitation is not visualized. No aortic stenosis is present. Pulmonic Valve: The pulmonic valve was normal in structure. Pulmonic valve regurgitation is not visualized. No evidence of pulmonic stenosis. Aorta: The aortic root is normal in size and structure. Venous: The inferior vena cava is normal in size with greater than 50% respiratory variability, suggesting right atrial pressure of 3 mmHg. IAS/Shunts: No atrial level shunt detected by color flow Doppler.  LEFT VENTRICLE PLAX 2D LVIDd:         4.50 cm     Diastology LVIDs:         3.20 cm     LV e' medial:    6.53 cm/s LV PW:         0.80 cm     LV E/e' medial:  7.2 LV IVS:        0.90 cm     LV e' lateral:   7.51 cm/s LVOT diam:     2.00 cm     LV E/e' lateral: 6.2 LV SV:         60 LV SV Index:   29 LVOT Area:     3.14 cm  LV Volumes (MOD) LV vol d, MOD A2C: 60.2 ml LV vol s, MOD A2C: 29.2 ml LV SV MOD A2C:     31.0 ml RIGHT VENTRICLE             IVC RV S prime:     11.30 cm/s  IVC diam: 1.40 cm TAPSE (M-mode): 1.2 cm LEFT ATRIUM             Index        RIGHT ATRIUM           Index LA diam:        3.20 cm 1.51 cm/m   RA Area:     10.10 cm LA Vol (A2C):   30.9 ml 14.61 ml/m  RA Volume:   19.40 ml  9.17 ml/m LA Vol (A4C):   36.0 ml 17.02 ml/m LA Biplane Vol: 34.3 ml 16.22 ml/m  AORTIC VALVE LVOT Vmax:   103.00 cm/s LVOT Vmean:  69.700 cm/s LVOT VTI:    0.192 m  AORTA Ao Root diam: 2.90 cm Ao Asc diam:  3.30 cm MITRAL VALVE MV Area (PHT): 4.49 cm    SHUNTS MV Decel Time: 169 msec    Systemic  VTI:  0.19 m MV E velocity:  46.70 cm/s  Systemic Diam: 2.00 cm MV A velocity: 63.60 cm/s MV E/A ratio:  0.73 Weston Brass MD Electronically signed by Weston Brass MD Signature Date/Time: 08/08/2022/12:14:51 PM    Final    EEG adult  Result Date: 08/07/2022 Charlsie Quest, MD     08/07/2022  7:21 AM Patient Name: Tongela Encinas MRN: [id number] Epilepsy Attending: Charlsie Quest Referring Physician/Provider: Lurline Del, MD Date: 08/07/2022 Duration: 21.32 mins Patient history: 66yo F with ams. EEG to evaluate for seizure Level of alertness: Awake, asleep AEDs during EEG study: None Technical aspects: This EEG study was done with scalp electrodes positioned according to the 10-20 International system of electrode placement. Electrical activity was reviewed with band pass filter of 1-70Hz , sensitivity of 7 uV/mm, display speed of 60mm/sec with a  notched filter applied as appropriate. EEG data were recorded continuously and digitally stored.  Video monitoring was available and reviewed as appropriate. Description: The posterior dominant rhythm consists of 6.5 Hz activity of moderate voltage (25-35 uV) seen predominantly in posterior head regions, symmetric and reactive to eye opening and eye closing. Sleep was characterized by vertex waves, sleep spindles (12 to 14 Hz), maximal frontocentral region.  EEG showed continuous generalized 3 to 6 Hz theta-delta slowing. Hyperventilation and photic stimulation were not performed.   ABNORMALITY - Continuous slow, generalized IMPRESSION: This study is suggestive of moderate diffuse encephalopathy, nonspecific etiology. No seizures or epileptiform discharges were seen throughout the recording. Charlsie Quest   MR BRAIN WO CONTRAST  Result Date: 08/07/2022 CLINICAL DATA:  66 year old female found down.  TIA. EXAM: MRI HEAD WITHOUT CONTRAST TECHNIQUE: Multiplanar, multiecho pulse sequences of the brain and surrounding structures were obtained without intravenous contrast.  COMPARISON:  Head CT yesterday. FINDINGS: Brain: Study is intermittently degraded by motion artifact despite repeated imaging attempts. Subtle diffusion abnormality along the periphery of confluent T2 and FLAIR hyperintensity in the right basal ganglia (series 12, image 53) isointense on ADC. Intrinsic T1 hyperintensity there (sagittal image 8) without convincing hemosiderin on T2* imaging. Also, some evidence of right midbrain Wallerian degeneration series 15, image 10. No other restricted diffusion. No midline shift, mass effect, evidence of mass lesion, extra-axial collection or acute intracranial hemorrhage. Cervicomedullary junction and pituitary are within normal limits. Symmetric bilateral mesial temporal lobe atrophy (series 19, image 15). No definite cortical encephalomalacia. Generalized ventricular enlargement is nonspecific and there is some evidence of hyperdynamic flow at the cerebral aqueduct. No transependymal edema. Vascular: Major intracranial vascular flow voids are preserved. Skull and upper cervical spine: Negative visible cervical spine. Visualized bone marrow signal is within normal limits. Sinuses/Orbits: Negative. Other: Grossly normal visible internal auditory structures. Negative visible scalp and face. IMPRESSION: 1. Intermittently motion degraded exam. 2. 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 But questionable faint diffusion restriction along the margins; difficult to exclude acute on chronic small vessel ischemia there. 3. No other acute infarct. Nonspecific ventriculomegaly which could be ex vacuo - and there is bilateral mesial temporal lobe atrophy - but difficult to exclude normal pressure hydrocephalus (NPH). Electronically Signed   By: Odessa Fleming M.D.   On: 08/07/2022 07:14   CT ABDOMEN PELVIS W CONTRAST  Result Date: 08/07/2022 CLINICAL DATA:  Abdominal pain, acute, nonlocalized EXAM: CT ABDOMEN AND PELVIS  WITH CONTRAST TECHNIQUE: Multidetector CT imaging of the abdomen and pelvis was performed using the standard protocol following bolus administration of  intravenous contrast. RADIATION DOSE REDUCTION: This exam was performed according to the departmental dose-optimization program which includes automated exposure control, adjustment of the mA and/or kV according to patient size and/or use of iterative reconstruction technique. CONTRAST:  75mL OMNIPAQUE IOHEXOL 350 MG/ML SOLN COMPARISON:  10/21/2015 FINDINGS: Lower chest: No acute abnormality. Hepatobiliary: No focal hepatic abnormality. Gallbladder unremarkable. Pancreas: No focal abnormality or ductal dilatation. Spleen: No focal abnormality.  Normal size. Adrenals/Urinary Tract: No adrenal abnormality. No focal renal abnormality. No stones or hydronephrosis. Urinary bladder is unremarkable. Stomach/Bowel: Normal appendix. Stomach, large and small bowel grossly unremarkable. Vascular/Lymphatic: Aortic atherosclerosis. No evidence of aneurysm or adenopathy. Reproductive: Uterus and adnexa unremarkable.  No mass. Other: No free fluid or free air. Musculoskeletal: No acute bony abnormality. IMPRESSION: No acute findings in the abdomen or pelvis. Aortic atherosclerosis. Electronically Signed   By: Charlett NoseKevin  Dover M.D.   On: 08/07/2022 03:32   DG Chest 2 View  Result Date: 08/06/2022 CLINICAL DATA:  Altered mental status, concern for sepsis or pneumonia. EXAM: CHEST - 2 VIEW COMPARISON:  11/10/2021. FINDINGS: The heart size and mediastinal contours are within normal limits. No consolidation, effusion, or pneumothorax. Degenerative changes in the thoracic spine. No acute abnormality. IMPRESSION: No active cardiopulmonary disease. Electronically Signed   By: Thornell SartoriusLaura  Taylor M.D.   On: 08/06/2022 23:11   CT Head Wo Contrast  Result Date: 08/06/2022 CLINICAL DATA:  Polytrauma, blunt.  Fall with mental status change. EXAM: CT HEAD WITHOUT CONTRAST CT CERVICAL SPINE  WITHOUT CONTRAST TECHNIQUE: Multidetector CT imaging of the head and cervical spine was performed following the standard protocol without intravenous contrast. Multiplanar CT image reconstructions of the cervical spine were also generated. RADIATION DOSE REDUCTION: This exam was performed according to the departmental dose-optimization program which includes automated exposure control, adjustment of the mA and/or kV according to patient size and/or use of iterative reconstruction technique. COMPARISON:  01/12/2016. FINDINGS: CT HEAD FINDINGS Brain: No acute intracranial hemorrhage, midline shift or mass effect. No extra-axial fluid collection. Diffuse atrophy is noted. Periventricular white matter hypodensities are present bilaterally. There is ex vacuo dilatation of the ventricles with mild ventriculomegaly. Old lacunar infarcts are present in the basal ganglia. Vascular: No hyperdense vessel or unexpected calcification. Skull: Normal. Negative for fracture or focal lesion. Sinuses/Orbits: Mild mucosal thickening in the left maxillary sinus. No acute orbital abnormality. Other: None. CT CERVICAL SPINE FINDINGS Alignment: Normal. Skull base and vertebrae: No acute fracture. No primary bone lesion or focal pathologic process. Soft tissues and spinal canal: No prevertebral fluid or swelling. No visible canal hematoma. Disc levels: Multilevel intervertebral disc space narrowing, degenerative endplate changes, and facet arthropathy. Upper chest: No acute abnormality. Other: None. IMPRESSION: 1. No acute intracranial hemorrhage. 2. Atrophy with chronic microvascular ischemic changes. 3. Ex vacuo dilatation of the ventricles with mild ventriculomegaly, may be associated with normal pressure hydrocephalus. Comparison with older imaging studies or MRI is recommended for follow-up. 4. Degenerative changes in the cervical spine without evidence of acute fracture. Electronically Signed   By: Thornell SartoriusLaura  Taylor M.D.   On: 08/06/2022  21:49   CT Cervical Spine Wo Contrast  Result Date: 08/06/2022 CLINICAL DATA:  Polytrauma, blunt.  Fall with mental status change. EXAM: CT HEAD WITHOUT CONTRAST CT CERVICAL SPINE WITHOUT CONTRAST TECHNIQUE: Multidetector CT imaging of the head and cervical spine was performed following the standard protocol without intravenous contrast. Multiplanar CT image reconstructions of the cervical spine were also generated. RADIATION DOSE REDUCTION: This exam was performed according to  the departmental dose-optimization program which includes automated exposure control, adjustment of the mA and/or kV according to patient size and/or use of iterative reconstruction technique. COMPARISON:  01/12/2016. FINDINGS: CT HEAD FINDINGS Brain: No acute intracranial hemorrhage, midline shift or mass effect. No extra-axial fluid collection. Diffuse atrophy is noted. Periventricular white matter hypodensities are present bilaterally. There is ex vacuo dilatation of the ventricles with mild ventriculomegaly. Old lacunar infarcts are present in the basal ganglia. Vascular: No hyperdense vessel or unexpected calcification. Skull: Normal. Negative for fracture or focal lesion. Sinuses/Orbits: Mild mucosal thickening in the left maxillary sinus. No acute orbital abnormality. Other: None. CT CERVICAL SPINE FINDINGS Alignment: Normal. Skull base and vertebrae: No acute fracture. No primary bone lesion or focal pathologic process. Soft tissues and spinal canal: No prevertebral fluid or swelling. No visible canal hematoma. Disc levels: Multilevel intervertebral disc space narrowing, degenerative endplate changes, and facet arthropathy. Upper chest: No acute abnormality. Other: None. IMPRESSION: 1. No acute intracranial hemorrhage. 2. Atrophy with chronic microvascular ischemic changes. 3. Ex vacuo dilatation of the ventricles with mild ventriculomegaly, may be associated with normal pressure hydrocephalus. Comparison with older imaging  studies or MRI is recommended for follow-up. 4. Degenerative changes in the cervical spine without evidence of acute fracture. Electronically Signed   By: Thornell Sartorius M.D.   On: 08/06/2022 21:49    Anti-infectives (From admission, onward)    Start     Dose/Rate Route Frequency Ordered Stop   08/07/22 2300  vancomycin (VANCOCIN) IVPB 1000 mg/200 mL premix  Status:  Discontinued        1,000 mg 200 mL/hr over 60 Minutes Intravenous Every 24 hours 08/06/22 2155 08/07/22 1159   08/07/22 2300  vancomycin (VANCOREADY) IVPB 1250 mg/250 mL        1,250 mg 166.7 mL/hr over 90 Minutes Intravenous Every 24 hours 08/07/22 1159     08/07/22 0600  piperacillin-tazobactam (ZOSYN) IVPB 3.375 g  Status:  Discontinued        3.375 g 12.5 mL/hr over 240 Minutes Intravenous Every 8 hours 08/06/22 2155 08/07/22 0137   08/07/22 0600  piperacillin-tazobactam (ZOSYN) IVPB 3.375 g        3.375 g 12.5 mL/hr over 240 Minutes Intravenous Every 8 hours 08/07/22 0142     08/07/22 0145  piperacillin-tazobactam (ZOSYN) IVPB 3.375 g  Status:  Discontinued        3.375 g 100 mL/hr over 30 Minutes Intravenous Every 8 hours 08/07/22 0136 08/07/22 0141   08/06/22 2130  piperacillin-tazobactam (ZOSYN) IVPB 3.375 g        3.375 g 100 mL/hr over 30 Minutes Intravenous  Once 08/06/22 2116 08/06/22 2301   08/06/22 2130  vancomycin (VANCOREADY) IVPB 1500 mg/300 mL        1,500 mg 150 mL/hr over 120 Minutes Intravenous  Once 08/06/22 2116 08/07/22 0039        Assessment/Plan Unstageable gluteal wound  - Patient likely developed a pressure wound after being found down at home for unknown period of time. Would benefit from debridement in the OR. I will call and discuss with the patient's daughter. If she is agreeable will plan to take to the OR tomorrow. NPO after midnight.  ID - zosyn/vancomycin VTE - sq heparin FEN - CM diet Foley - none  Acute metabolic encephalopathy, found down DM HTN Chronic CVA  I reviewed  hospitalist notes, last 24 h vitals and pain scores, last 48 h intake and output, last 24 h labs and trends, and  last 24 h imaging results.   Franne Forts, [facility] 08/08/2022, 12:37 PM Please see Amion for pager number during day hours 7:00am-4:30pm

## 2022-08-08 NOTE — ED Notes (Signed)
Delay in moving pt OTF

## 2022-08-08 NOTE — Progress Notes (Signed)
PROGRESS NOTE  Connie Wilson WGN:[id number] DOB: 06-Nov-1955 DOA: 08/06/2022 PCP: Milford Cage, PA   LOS: 1 day   Brief Narrative / Interim history: 66 year old female with DM2, HTN, TIA, depression, anxiety, recurrent UTIs who presents to the ED after being found down at home.  She lives alone, was last seen about a week ago, she was confused upon EMS arrival, soiled with urine and feces.  Subjective / 24h Interval events: She is much more alert this morning, doing well.  Denies any chest pain, denies any shortness of breath, no abdominal pain, no nausea or vomiting.  Asking to go home  Assesement and Plan: Principal Problem:   Sepsis (Gratis) Active Problems:   Acute metabolic encephalopathy   Principal problem SIRS-patient was admitted to the hospital with tachypnea, tachycardia, confusion as well as elevated WBC.  Source is not entirely clear, perhaps back wound versus a UTI.  Cultures are still pending.  She has been placed on broad-spectrum antibiotics, continue for now.  White count improving  Active problems Acute metabolic encephalopathy-likely due to #1.  She was home, confused, down on the floor for an unclear amount of time, up to a week since she was last seen well.  Patient has no recollection of the prior few days.  Thankfully her mental status seem to be improving with antibiotics and supportive care  Chronic CVA-incidental on the MRI of the brain, MRI reviewed and discussed with neurology who did not feel this is an acute issue.  Start aspirin, check lipid panel  Pressure injury, back wound, POA-likely from being down, although appearance may suggest some degree of chronicity to it.  Apparently, per patient and daughter, she is independent, driving, and was at work up until couple weeks ago.  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  DM2-hold home metformin, placed on sliding scale.  Most recent A1c in 2021.  Repeat.  Hypertension-at home she  is on clonidine, lisinopril.  Medications have not been reconciled by pharmacy.  Currently normotensive  Depression-resume home Celexa  Hypokalemia-replete this morning, continue to monitor  Hypomagnesemia-replete this morning, continue to monitor  Scheduled Meds:  heparin  5,000 Units Subcutaneous Q8H   potassium chloride  30 mEq Oral Once   Continuous Infusions:  piperacillin-tazobactam (ZOSYN)  IV 3.375 g (08/08/22 7846)   vancomycin Stopped (08/08/22 0121)   PRN Meds:.acetaminophen **OR** acetaminophen, albuterol, ondansetron **OR** ondansetron (ZOFRAN) IV  Current Outpatient Medications  Medication Instructions   calcium citrate (CALCITRATE - DOSED IN MG ELEMENTAL CALCIUM) 950 MG tablet 1 tablet, Oral, 2 times daily   citalopram (CELEXA) 20 mg, Oral, Daily   cloNIDine (CATAPRES) 0.2 mg, Oral, 2 times daily   cyclobenzaprine (FLEXERIL) 5 mg, Oral, Daily PRN   fluticasone (FLONASE) 50 MCG/ACT nasal spray 2 sprays, Each Nare, Daily   lisinopril (ZESTRIL) 20 MG tablet Take 1 tablet by mouth once daily   metFORMIN (GLUCOPHAGE) 500 mg, Oral, Daily with breakfast   Multiple Vitamin (MULTIVITAMIN) tablet 1 tablet, Oral, Daily   Omega-3 Fatty Acids (FISH OIL) 1000 MG CAPS Oral   OVER THE COUNTER MEDICATION Vitamin D 3 one capsule daily.    vitamin C 1,000 mg, Oral, Daily    Diet Orders (From admission, onward)     Start     Ordered   08/07/22 1209  Diet vegetarian Room service appropriate? Yes; Fluid consistency: Thin  Diet effective now       Question Answer Comment  Room service appropriate? Yes  Fluid consistency: Thin      08/07/22 1208            DVT prophylaxis: heparin injection 5,000 Units Start: 08/07/22 0600   Lab Results  Component Value Date   PLT 202 08/08/2022      Code Status: Full Code  Family Communication: Updated daughter over the phone  Status is: Inpatient  Remains inpatient appropriate because: Surgical consultation, debility  Level  of care: Med-Surg  Consultants:  General surgery  Objective: Vitals:   08/08/22 0500 08/08/22 0600 08/08/22 0745 08/08/22 0745  BP: (!) 147/93 (!) 141/114 (!) 111/94   Pulse: 91 94 92   Resp: 16 12 17    Temp:  98.3 F (36.8 C)  97.7 F (36.5 C)  TempSrc:  Oral  Oral  SpO2: 97% 96% 100%     Intake/Output Summary (Last 24 hours) at 08/08/2022 0902 Last data filed at 08/08/2022 0508 Gross per 24 hour  Intake 2057.5 ml  Output --  Net 2057.5 ml   Wt Readings from Last 3 Encounters:  06/23/22 98.3 kg  11/16/21 96.6 kg  11/10/21 98 kg    Examination:  Constitutional: NAD Eyes: no scleral icterus ENMT: Mucous membranes are moist.  Neck: normal, supple Respiratory: clear to auscultation bilaterally, no wheezing, no crackles. Normal respiratory effort. No accessory muscle use.  Cardiovascular: Regular rate and rhythm, no murmurs / rubs / gallops. No LE edema.  Abdomen: non distended, no tenderness. Bowel sounds positive.  Musculoskeletal: no clubbing / cyanosis.  Skin: Back wound as below Neurologic: non focal     Data Reviewed: I have independently reviewed following labs and imaging studies   CBC Recent Labs  Lab 08/06/22 2058 08/07/22 0213 08/07/22 0522 08/08/22 0433  WBC 27.8* 28.0* 27.7* 15.2*  HGB 16.9* 14.6 14.8 11.9*  HCT 50.6* 43.1 46.4* 35.5*  PLT 301 238 249 202  MCV 84.6 85.9 90.3 84.5  MCH 28.3 29.1 28.8 28.3  MCHC 33.4 33.9 31.9 33.5  RDW 15.0 15.3 15.4 15.0  LYMPHSABS 2.2  --   --   --   MONOABS 1.4*  --   --   --   EOSABS 0.0  --   --   --   BASOSABS 0.0  --   --   --     Recent Labs  Lab 08/06/22 2058 08/06/22 2255 08/07/22 0213 08/07/22 0522 08/08/22 0433  NA 145  --   --  146* 139  K 4.1  --   --  3.8 3.0*  CL 105  --   --  113* 110  CO2 25  --   --  16* 21*  GLUCOSE 163*  --   --  143* 95  BUN 49*  --   --  41* 22  CREATININE 1.14*  --  1.02* 0.88 0.84  CALCIUM 10.5*  --   --  9.0 8.4*  AST 36  --   --  34 32  ALT 30  --    --  25 29  ALKPHOS 103  --   --  80 74  BILITOT 1.2  --   --  1.1 0.8  ALBUMIN 3.0*  --   --  2.3* 2.1*  MG  --   --   --   --  1.6*  LATICACIDVEN  --  2.0* 2.0*  --   --   AMMONIA 16  --   --   --   --     ------------------------------------------------------------------------------------------------------------------  No results for input(s): "CHOL", "HDL", "LDLCALC", "TRIG", "CHOLHDL", "LDLDIRECT" in the last 72 hours.  Lab Results  Component Value Date   HGBA1C 6.4 03/17/2020   ------------------------------------------------------------------------------------------------------------------ No results for input(s): "TSH", "T4TOTAL", "T3FREE", "THYROIDAB" in the last 72 hours.  Invalid input(s): "FREET3"  Cardiac Enzymes No results for input(s): "CKMB", "TROPONINI", "MYOGLOBIN" in the last 168 hours.  Invalid input(s): "CK" ------------------------------------------------------------------------------------------------------------------    Component Value Date/Time   BNP 150.7 (H) 11/10/2021 1144    CBG: Recent Labs  Lab 08/07/22 0624 08/07/22 1204 08/07/22 1740 08/08/22 0559  GLUCAP 177* 100* 119* 97    Recent Results (from the past 240 hour(s))  SARS Coronavirus 2 by RT PCR (hospital order, performed in [facility] hospital lab) *cepheid single result test* Anterior Nasal Swab     Status: None   Collection Time: 08/06/22  8:58 PM   Specimen: Anterior Nasal Swab  Result Value Ref Range Status   SARS Coronavirus 2 by RT PCR NEGATIVE NEGATIVE Final    Comment: (NOTE) SARS-CoV-2 target nucleic acids are NOT DETECTED.  The SARS-CoV-2 RNA is generally detectable in upper and lower respiratory specimens during the acute phase of infection. The lowest concentration of SARS-CoV-2 viral copies this assay can detect is 250 copies / mL. A negative result does not preclude SARS-CoV-2 infection and should not be used as the sole basis for treatment or other patient  management decisions.  A negative result may occur with improper specimen collection / handling, submission of specimen other than nasopharyngeal swab, presence of viral mutation(s) within the areas targeted by this assay, and inadequate number of viral copies (<250 copies / mL). A negative result must be combined with clinical observations, patient history, and epidemiological information.  Fact Sheet for Patients:   RoadLapTop.co.za  Fact Sheet for Healthcare Providers: http://kim-miller.com/  This test is not yet approved or  cleared by the [place] FDA and has been authorized for detection and/or diagnosis of SARS-CoV-2 by FDA under an Emergency Use Authorization (EUA).  This EUA will remain in effect (meaning this test can be used) for the duration of the COVID-19 declaration under Section 564(b)(1) of the Act, 21 U.S.C. section 360bbb-3(b)(1), unless the authorization is terminated or revoked sooner.  Performed at [facility], [address] [place], [place] [address]   Blood culture (routine x 2)     Status: None (Preliminary result)   Collection Time: 08/06/22  9:55 PM   Specimen: BLOOD  Result Value Ref Range Status   Specimen Description BLOOD LEFT ANTECUBITAL  Final   Special Requests   Final    BOTTLES DRAWN AEROBIC AND ANAEROBIC Blood Culture results may not be optimal due to an inadequate volume of blood received in culture bottles   Culture   Final    NO GROWTH 2 DAYS Performed at [facility], [address] [place], [place] [address]    Report Status PENDING  Incomplete  Blood culture (routine x 2)     Status: None (Preliminary result)   Collection Time: 08/06/22 10:01 PM   Specimen: BLOOD RIGHT HAND  Result Value Ref Range Status   Specimen Description BLOOD RIGHT HAND  Final   Special Requests   Final    BOTTLES DRAWN AEROBIC AND ANAEROBIC Blood Culture adequate volume   Culture   Final     NO GROWTH 2 DAYS Performed at [facility], [address] [place], [place] [address]    Report Status PENDING  Incomplete     Radiology Studies: No results found.   Marzetta Board, MD, PhD [facility]  Between 7 am - 7 pm I am available, please contact me via Amion (for emergencies) or Securechat (non urgent messages)  Between 7 pm - 7 am I am not available, please contact night coverage MD/APP via Amion

## 2022-08-08 NOTE — Plan of Care (Signed)

## 2022-08-08 NOTE — ED Notes (Signed)
Urine was leaking around pt's foley. Foley removed. Linen changed. Dressing applied to wounds on buttocks using betadine, gauze and foam dressing

## 2022-08-08 NOTE — ED Notes (Signed)
Pt is currently eating breakfast. Alert and oriented x 4. Follows commands but unable to remember events how she ended up on the floor. Cardiac monitoring in place. Will continue to monitor.

## 2022-08-08 NOTE — ED Notes (Signed)
Patient alert and able to answer questions.  Pt remains confused about situation and continues to request to go home.  Patient reoriented and informed of reasoning she is currently in the hospital.  Patient able to recall she has a daughter and a grandson.  Able to state her place of employment and that she currently works as a [employer]ar[employer] manager.  Believes she has been in hospital and that fall occurred in the hospital.  Patient informed that she was found at home and that the fall occurred at home.      Patient repositioned in bed to help prevent additional skin breakdown.  Waiting for surgery to recommend care of wounds to back.  Patient provided with new sheets and blankets.  No complaints voiced at this time.

## 2022-08-08 NOTE — Progress Notes (Signed)
Pt. Arrived to unit alert to self, and situation. No c/o pain, noted unstable to left and right buttocks, and stage 2 to mid back. Bed in lowest position call bell within reach

## 2022-08-08 NOTE — Progress Notes (Signed)
  Echocardiogram 2D Echocardiogram has been performed.  Connie Wilson 08/08/2022, 10:59 AM

## 2022-08-09 ENCOUNTER — Encounter (HOSPITAL_COMMUNITY)
Admission: EM | Disposition: A | Discharge status: Skilled Nursing Facility | Payer: Self-pay | Source: Home / Self Care | Attending: Internal Medicine

## 2022-08-09 ENCOUNTER — Other Ambulatory Visit: Payer: Self-pay

## 2022-08-09 ENCOUNTER — Encounter (HOSPITAL_COMMUNITY): Payer: Self-pay | Admitting: Internal Medicine

## 2022-08-09 ENCOUNTER — Inpatient Hospital Stay (HOSPITAL_COMMUNITY): Payer: Medicare HMO | Admitting: Anesthesiology

## 2022-08-09 DIAGNOSIS — S31809A Unspecified open wound of unspecified buttock, initial encounter: Secondary | ICD-10-CM

## 2022-08-09 DIAGNOSIS — A419 Sepsis, unspecified organism: Secondary | ICD-10-CM | POA: Diagnosis not present

## 2022-08-09 DIAGNOSIS — L03312 Cellulitis of back [any part except buttock]: Secondary | ICD-10-CM | POA: Diagnosis not present

## 2022-08-09 DIAGNOSIS — L8990 Pressure ulcer of unspecified site, unspecified stage: Secondary | ICD-10-CM | POA: Insufficient documentation

## 2022-08-09 DIAGNOSIS — G9341 Metabolic encephalopathy: Secondary | ICD-10-CM | POA: Diagnosis not present

## 2022-08-09 HISTORY — PX: INCISION AND DRAINAGE PERIRECTAL ABSCESS: SHX1804

## 2022-08-09 LAB — COMPREHENSIVE METABOLIC PANEL
ALT: 30 U/L (ref 0–44)
AST: 31 U/L (ref 15–41)
Albumin: 1.8 g/dL — ABNORMAL LOW (ref 3.5–5.0)
Alkaline Phosphatase: 72 U/L (ref 38–126)
Anion gap: 6 (ref 5–15)
BUN: 13 mg/dL (ref 8–23)
CO2: 24 mmol/L (ref 22–32)
Calcium: 8.1 mg/dL — ABNORMAL LOW (ref 8.9–10.3)
Chloride: 107 mmol/L (ref 98–111)
Creatinine, Ser: 0.83 mg/dL (ref 0.44–1.00)
GFR, Estimated: >60 mL/min (ref >60)
Glucose, Bld: 199 mg/dL — ABNORMAL HIGH (ref 70–99)
Potassium: 3.2 mmol/L — ABNORMAL LOW (ref 3.5–5.1)
Sodium: 137 mmol/L (ref 135–145)
Total Bilirubin: 0.7 mg/dL (ref 0.3–1.2)
Total Protein: 5.2 g/dL — ABNORMAL LOW (ref 6.5–8.1)

## 2022-08-09 LAB — CBC
HCT: 33.5 % — ABNORMAL LOW (ref 36.0–46.0)
Hemoglobin: 11.4 g/dL — ABNORMAL LOW (ref 12.0–15.0)
MCH: 28.8 pg (ref 26.0–34.0)
MCHC: 34 g/dL (ref 30.0–36.0)
MCV: 84.6 fL (ref 80.0–100.0)
Platelets: 182 10*3/uL (ref 150–400)
RBC: 3.96 MIL/uL (ref 3.87–5.11)
RDW: 14.5 % (ref 11.5–15.5)
WBC: 10.8 10*3/uL — ABNORMAL HIGH (ref 4.0–10.5)
nRBC: 0 % (ref 0.0–0.2)

## 2022-08-09 LAB — GLUCOSE, CAPILLARY
Glucose-Capillary: 225 mg/dL — ABNORMAL HIGH (ref 70–99)
Glucose-Capillary: 83 mg/dL (ref 70–99)
Glucose-Capillary: 99 mg/dL (ref 70–99)
Glucose-Capillary: 212 mg/dL — ABNORMAL HIGH (ref 70–99)
Glucose-Capillary: 224 mg/dL — ABNORMAL HIGH (ref 70–99)

## 2022-08-09 LAB — MAGNESIUM: Magnesium: 1.9 mg/dL (ref 1.7–2.4)

## 2022-08-09 SURGERY — INCISION AND DRAINAGE, ABSCESS, PERIRECTAL
Anesthesia: General

## 2022-08-09 MED ORDER — MORPHINE SULFATE (PF) 2 MG/ML IV SOLN
2.0000 mg | INTRAVENOUS | Status: DC | PRN
  Administered 2022-08-10: 2 mg via INTRAVENOUS
  Filled 2022-08-09: qty 1

## 2022-08-09 MED ORDER — CHLORHEXIDINE GLUCONATE 0.12 % MT SOLN: 15.0000 mL | Freq: Once | OROMUCOSAL | Status: AC

## 2022-08-09 MED ORDER — CHLORHEXIDINE GLUCONATE CLOTH 2 % EX PADS: 6.0000 | MEDICATED_PAD | Freq: Once | CUTANEOUS | Status: DC

## 2022-08-09 MED ORDER — TRAMADOL HCL 50 MG PO TABS
50.0000 mg | ORAL_TABLET | Freq: Four times a day (QID) | ORAL | Status: DC | PRN
  Administered 2022-08-12: 50 mg via ORAL
  Filled 2022-08-09 (×2): qty 1

## 2022-08-09 MED ORDER — ONDANSETRON HCL 4 MG/2ML IJ SOLN
INTRAMUSCULAR | Status: AC
  Filled 2022-08-09: qty 2

## 2022-08-09 MED ORDER — CHLORHEXIDINE GLUCONATE 0.12 % MT SOLN
OROMUCOSAL | Status: AC
  Administered 2022-08-09: 15 mL via OROMUCOSAL
  Filled 2022-08-09: qty 15

## 2022-08-09 MED ORDER — PROPOFOL 10 MG/ML IV BOLUS
INTRAVENOUS | Status: AC
  Filled 2022-08-09: qty 20

## 2022-08-09 MED ORDER — POTASSIUM CHLORIDE CRYS ER 20 MEQ PO TBCR: 40.0000 meq | EXTENDED_RELEASE_TABLET | Freq: Once | ORAL | Status: DC

## 2022-08-09 MED ORDER — ONDANSETRON HCL 4 MG/2ML IJ SOLN: 4.0000 mg | Freq: Four times a day (QID) | INTRAMUSCULAR | Status: DC | PRN

## 2022-08-09 MED ORDER — LACTATED RINGERS IV SOLN: INTRAVENOUS | Status: DC

## 2022-08-09 MED ORDER — PIPERACILLIN-TAZOBACTAM 3.375 G IVPB
3.3750 g | Freq: Three times a day (TID) | INTRAVENOUS | Status: DC
  Administered 2022-08-09 – 2022-08-12 (×8): 3.375 g via INTRAVENOUS
  Filled 2022-08-09 (×7): qty 50

## 2022-08-09 MED ORDER — ROCURONIUM BROMIDE 10 MG/ML (PF) SYRINGE
PREFILLED_SYRINGE | INTRAVENOUS | Status: AC
  Filled 2022-08-09: qty 10

## 2022-08-09 MED ORDER — DEXAMETHASONE SODIUM PHOSPHATE 10 MG/ML IJ SOLN
INTRAMUSCULAR | Status: AC
  Filled 2022-08-09: qty 1

## 2022-08-09 MED ORDER — DEXAMETHASONE SODIUM PHOSPHATE 10 MG/ML IJ SOLN
INTRAMUSCULAR | Status: DC | PRN
  Administered 2022-08-09: 10 mg via INTRAVENOUS

## 2022-08-09 MED ORDER — SUGAMMADEX SODIUM 200 MG/2ML IV SOLN
INTRAVENOUS | Status: DC | PRN
  Administered 2022-08-09: 200 mg via INTRAVENOUS

## 2022-08-09 MED ORDER — PROPOFOL 10 MG/ML IV BOLUS
INTRAVENOUS | Status: DC | PRN
  Administered 2022-08-09: 100 mg via INTRAVENOUS

## 2022-08-09 MED ORDER — ROCURONIUM BROMIDE 10 MG/ML (PF) SYRINGE
PREFILLED_SYRINGE | INTRAVENOUS | Status: DC | PRN
  Administered 2022-08-09: 50 mg via INTRAVENOUS

## 2022-08-09 MED ORDER — 0.9 % SODIUM CHLORIDE (POUR BTL) OPTIME
TOPICAL | Status: DC | PRN
  Administered 2022-08-09: 1000 mL

## 2022-08-09 MED ORDER — FENTANYL CITRATE (PF) 250 MCG/5ML IJ SOLN
INTRAMUSCULAR | Status: DC | PRN
  Administered 2022-08-09: 100 ug via INTRAVENOUS
  Administered 2022-08-09 (×3): 50 ug via INTRAVENOUS

## 2022-08-09 MED ORDER — FENTANYL CITRATE (PF) 250 MCG/5ML IJ SOLN
INTRAMUSCULAR | Status: AC
  Filled 2022-08-09: qty 5

## 2022-08-09 MED ORDER — ONDANSETRON HCL 4 MG/2ML IJ SOLN
INTRAMUSCULAR | Status: DC | PRN
  Administered 2022-08-09: 4 mg via INTRAVENOUS

## 2022-08-09 MED ORDER — OXYCODONE HCL 5 MG PO TABS: 5.0000 mg | ORAL_TABLET | Freq: Once | ORAL | Status: DC | PRN

## 2022-08-09 MED ORDER — ORAL CARE MOUTH RINSE: 15.0000 mL | Freq: Once | OROMUCOSAL | Status: AC

## 2022-08-09 MED ORDER — LIDOCAINE 2% (20 MG/ML) 5 ML SYRINGE
INTRAMUSCULAR | Status: AC
  Filled 2022-08-09: qty 5

## 2022-08-09 MED ORDER — FENTANYL CITRATE (PF) 100 MCG/2ML IJ SOLN: 25.0000 ug | INTRAMUSCULAR | Status: DC | PRN

## 2022-08-09 MED ORDER — MIDAZOLAM HCL 2 MG/2ML IJ SOLN
INTRAMUSCULAR | Status: AC
  Filled 2022-08-09: qty 2

## 2022-08-09 MED ORDER — OXYCODONE HCL 5 MG/5ML PO SOLN: 5.0000 mg | Freq: Once | ORAL | Status: DC | PRN

## 2022-08-09 MED ORDER — LIDOCAINE 2% (20 MG/ML) 5 ML SYRINGE
INTRAMUSCULAR | Status: DC | PRN
  Administered 2022-08-09: 60 mg via INTRAVENOUS

## 2022-08-09 SURGICAL SUPPLY — 31 items
BAG COUNTER SPONGE SURGICOUNT (BAG) ×1 IMPLANT
BNDG GAUZE DERMACEA FLUFF 4 (GAUZE/BANDAGES/DRESSINGS) IMPLANT
CANISTER SUCT 3000ML PPV (MISCELLANEOUS) ×1 IMPLANT
COVER SURGICAL LIGHT HANDLE (MISCELLANEOUS) ×1 IMPLANT
DRAPE UTILITY XL STRL (DRAPES) ×1 IMPLANT
ELECT REM PT RETURN 9FT ADLT (ELECTROSURGICAL)
ELECTRODE REM PT RTRN 9FT ADLT (ELECTROSURGICAL) IMPLANT
GAUZE 4X4 16PLY ~~LOC~~+RFID DBL (SPONGE) ×1 IMPLANT
GAUZE PAD ABD 7.5X8 STRL (GAUZE/BANDAGES/DRESSINGS) IMPLANT
GAUZE PAD ABD 8X10 STRL (GAUZE/BANDAGES/DRESSINGS) ×1 IMPLANT
GAUZE SPONGE 4X4 12PLY STRL (GAUZE/BANDAGES/DRESSINGS) ×1 IMPLANT
GLOVE BIO SURGEON STRL SZ8 (GLOVE) ×1 IMPLANT
GLOVE BIOGEL PI IND STRL 8 (GLOVE) ×1 IMPLANT
GOWN STRL REUS W/ TWL LRG LVL3 (GOWN DISPOSABLE) ×2 IMPLANT
GOWN STRL REUS W/ TWL XL LVL3 (GOWN DISPOSABLE) ×1 IMPLANT
GOWN STRL REUS W/TWL LRG LVL3 (GOWN DISPOSABLE) ×2
GOWN STRL REUS W/TWL XL LVL3 (GOWN DISPOSABLE) ×1
KIT BASIN OR (CUSTOM PROCEDURE TRAY) ×1 IMPLANT
KIT TURNOVER KIT B (KITS) ×1 IMPLANT
NS IRRIG 1000ML POUR BTL (IV SOLUTION) ×1 IMPLANT
PACK LITHOTOMY IV (CUSTOM PROCEDURE TRAY) ×1 IMPLANT
PACKING GAUZE IODOFORM 1INX5YD (GAUZE/BANDAGES/DRESSINGS) IMPLANT
PAD ARMBOARD 7.5X6 YLW CONV (MISCELLANEOUS) ×2 IMPLANT
PENCIL SMOKE EVACUATOR (MISCELLANEOUS) IMPLANT
SWAB COLLECTION DEVICE MRSA (MISCELLANEOUS) ×1 IMPLANT
SWAB CULTURE ESWAB REG 1ML (MISCELLANEOUS) ×1 IMPLANT
TOWEL GREEN STERILE (TOWEL DISPOSABLE) ×1 IMPLANT
TOWEL GREEN STERILE FF (TOWEL DISPOSABLE) ×1 IMPLANT
TUBE CONNECTING 12X1/4 (SUCTIONS) ×1 IMPLANT
UNDERPAD 30X36 HEAVY ABSORB (UNDERPADS AND DIAPERS) ×1 IMPLANT
YANKAUER SUCT BULB TIP NO VENT (SUCTIONS) ×1 IMPLANT

## 2022-08-09 NOTE — Progress Notes (Signed)
PROGRESS NOTE  Connie Wilson MPN:[id number] DOB: [birth year] DOA: 08/06/2022 PCP: Paschal Dopp, PA   LOS: 2 days   Brief Narrative / Interim history: 66 year old female with DM2, HTN, TIA, depression, anxiety, recurrent UTIs who presents to the ED after being found down at home.  She lives alone, was last seen about a week ago, she was confused upon EMS arrival, soiled with urine and feces.  She was probably down for about a week without clear-cut inciting event.  She has no recollection of the events.  Subjective / 24h Interval events: Alert this morning, but quite confused.  Daughter is at bedside.  Assesement and Plan: Principal Problem:   Sepsis (HCC) Active Problems:   Acute metabolic encephalopathy   Pressure injury of skin   Principal problem SIRS-patient was admitted to the hospital with tachypnea, tachycardia, confusion as well as elevated WBC.  Source is not entirely clear, perhaps back wound versus a UTI.  Urine cultures with multiple species.  Blood cultures with 1/4 bottles with gram-positive rods, likely contaminant.  White count is almost normalized from 28 K on admission to 10K this morning.  Continue broad-spectrum antibiotics for now, she reviewed surgery for her back 1 this morning  Active problems Acute metabolic encephalopathy-likely due to #1.  She was home, confused, down on the floor for an unclear amount of time, up to a week since she was last seen well.  Patient has no recollection of the prior few days.  Thankfully her mental status seem to be improving with antibiotics and supportive care.  There is no clear-cut inciting event which may have led to all this.  Chronic CVA-incidental on the MRI of the brain, MRI reviewed and discussed with neurology who did not feel this is an acute issue.  Start aspirin, check lipid panel -PT consult pending  Pressure injury, back wound, POA-likely from being down, although appearance may suggest some degree of  chronicity to it.  Apparently, per patient and daughter, she is independent, driving, and was at work up until couple weeks ago.  Works as a Lawyer -General surgery consulted, appreciate input, she will be taken to the OR today for debridement.  DM2-hold home metformin, placed on sliding scale.  Most recent A1c in 2021.  Repeat showing an A1c of 6.2.  CBG (last 3)  Recent Labs    08/08/22 1943 08/09/22 0353 08/09/22 0816  GLUCAP 104* 225* 83    Hypertension-at home she is on clonidine, lisinopril.  Becoming more hypertensive.  Resume postop  Depression-resume home Celexa  Hypokalemia-continue to replete and monitor  Hypomagnesemia-continue to monitor  Scheduled Meds:  [MAR Hold] aspirin  81 mg Oral Daily   Chlorhexidine Gluconate Cloth  6 each Topical Once   And   Chlorhexidine Gluconate Cloth  6 each Topical Once   [MAR Hold] citalopram  40 mg Oral Daily   [MAR Hold] heparin  5,000 Units Subcutaneous Q8H   [MAR Hold] insulin aspart  0-9 Units Subcutaneous TID WC   [MAR Hold] potassium chloride  40 mEq Oral Once   Continuous Infusions:  lactated ringers 10 mL/hr at 08/09/22 1128   [MAR Hold] piperacillin-tazobactam (ZOSYN)  IV 3.375 g (08/09/22 0500)   [MAR Hold] vancomycin 1,250 mg (08/08/22 2153)   PRN Meds:.[MAR Hold] acetaminophen **OR** [MAR Hold] acetaminophen, [MAR Hold] albuterol, [MAR Hold] ondansetron **OR** [MAR Hold] ondansetron (ZOFRAN) IV  Current Outpatient Medications  Medication Instructions   calcium citrate (CALCITRATE - DOSED IN MG ELEMENTAL CALCIUM) 950 MG tablet  1 tablet, Oral, 2 times daily   citalopram (CELEXA) 20 mg, Oral, Daily   cloNIDine (CATAPRES) 0.2 mg, Oral, 2 times daily   lisinopril (ZESTRIL) 20 MG tablet Take 1 tablet by mouth once daily   metFORMIN (GLUCOPHAGE) 500 mg, Oral, Daily with breakfast   Multiple Vitamin (MULTIVITAMIN) tablet 1 tablet, Oral, Daily   Omega-3 Fatty Acids (FISH OIL) 1000 MG CAPS Oral   OVER THE COUNTER MEDICATION  Vitamin D 3 one capsule daily.    pantoprazole (PROTONIX) 20 mg, Oral, Daily   vitamin C 1,000 mg, Oral, Daily    Diet Orders (From admission, onward)     Start     Ordered   08/10/22 0000  Diet NPO time specified  Diet effective midnight        08/09/22 0939   08/09/22 0001  Diet NPO time specified Except for: Sips with Meds  Diet effective midnight       Question:  Except for  Answer:  Sips with Meds   08/08/22 1702            DVT prophylaxis: SCD's Start: 08/09/22 0940 heparin injection 5,000 Units Start: 08/07/22 0600   Lab Results  Component Value Date   PLT 182 08/09/2022      Code Status: Full Code  Family Communication: Updated daughter over the phone  Status is: Inpatient  Remains inpatient appropriate because: Surgical consultation, debility  Level of care: Med-Surg  Consultants:  General surgery  Objective: Vitals:   08/08/22 1545 08/08/22 2130 08/09/22 0819 08/09/22 0949  BP: (!) 159/101 (!) 168/85 (!) 159/80 (!) 169/89  Pulse: 89 87 87 88  Resp:  18 17 17   Temp: 97.6 F (36.4 C) 98.3 F (36.8 C) 98.8 F (37.1 C)   TempSrc: Oral Oral Oral   SpO2: 96% 98% 100% 97%  Weight:        Intake/Output Summary (Last 24 hours) at 08/09/2022 1156 Last data filed at 08/09/2022 0944 Gross per 24 hour  Intake 50 ml  Output 1850 ml  Net -1800 ml    Wt Readings from Last 3 Encounters:  08/08/22 91.5 kg  06/23/22 98.3 kg  11/16/21 96.6 kg    Examination:  Constitutional: NAD Eyes: lids and conjunctivae normal, no scleral icterus ENMT: mmm Neck: normal, supple Respiratory: clear to auscultation bilaterally, no wheezing, no crackles. Normal respiratory effort.  Cardiovascular: Regular rate and rhythm, no murmurs / rubs / gallops. No LE edema. Abdomen: soft, no distention, no tenderness. Bowel sounds positive.  Skin: no rashes Neurologic: no focal deficits, equal strength     Data Reviewed: I have independently reviewed following labs and  imaging studies   CBC Recent Labs  Lab 08/06/22 2058 08/07/22 0213 08/07/22 0522 08/08/22 0433 08/09/22 0349  WBC 27.8* 28.0* 27.7* 15.2* 10.8*  HGB 16.9* 14.6 14.8 11.9* 11.4*  HCT 50.6* 43.1 46.4* 35.5* 33.5*  PLT 301 238 249 202 182  MCV 84.6 85.9 90.3 84.5 84.6  MCH 28.3 29.1 28.8 28.3 28.8  MCHC 33.4 33.9 31.9 33.5 34.0  RDW 15.0 15.3 15.4 15.0 14.5  LYMPHSABS 2.2  --   --   --   --   MONOABS 1.4*  --   --   --   --   EOSABS 0.0  --   --   --   --   BASOSABS 0.0  --   --   --   --      Recent Labs  Lab 08/06/22 2058  08/06/22 2255 08/07/22 0109 08/07/22 0522 08/08/22 0433 08/09/22 0349  NA 145  --   --  146* 139 137  K 4.1  --   --  3.8 3.0* 3.2*  CL 105  --   --  113* 110 107  CO2 25  --   --  16* 21* 24  GLUCOSE 163*  --   --  143* 95 199*  BUN 49*  --   --  41* 22 13  CREATININE 1.14*  --  1.02* 0.88 0.84 0.83  CALCIUM 10.5*  --   --  9.0 8.4* 8.1*  AST 36  --   --  34 32 31  ALT 30  --   --  25 29 30   ALKPHOS 103  --   --  80 74 72  BILITOT 1.2  --   --  1.1 0.8 0.7  ALBUMIN 3.0*  --   --  2.3* 2.1* 1.8*  MG  --   --   --   --  1.6* 1.9  LATICACIDVEN  --  2.0* 2.0*  --   --   --   HGBA1C  --   --   --   --  6.2*  --   AMMONIA 16  --   --   --   --   --      ------------------------------------------------------------------------------------------------------------------ Recent Labs    08/08/22 0433  CHOL 156  HDL 26*  LDLCALC 98  TRIG 159*  CHOLHDL 6.0    Lab Results  Component Value Date   HGBA1C 6.2 (H) 08/08/2022   ------------------------------------------------------------------------------------------------------------------ No results for input(s): "TSH", "T4TOTAL", "T3FREE", "THYROIDAB" in the last 72 hours.  Invalid input(s): "FREET3"  Cardiac Enzymes No results for input(s): "CKMB", "TROPONINI", "MYOGLOBIN" in the last 168 hours.  Invalid input(s):  "CK" ------------------------------------------------------------------------------------------------------------------    Component Value Date/Time   BNP 150.7 (H) 11/10/2021 1144    CBG: Recent Labs  Lab 08/08/22 1311 08/08/22 1733 08/08/22 1943 08/09/22 0353 08/09/22 0816  GLUCAP 100* 140* 104* 225* 83     Recent Results (from the past 240 hour(s))  SARS Coronavirus 2 by RT PCR (hospital order, performed in [facility] hospital lab) *cepheid single result test* Anterior Nasal Swab     Status: None   Collection Time: 08/06/22  8:58 PM   Specimen: Anterior Nasal Swab  Result Value Ref Range Status   SARS Coronavirus 2 by RT PCR NEGATIVE NEGATIVE Final    Comment: (NOTE) SARS-CoV-2 target nucleic acids are NOT DETECTED.  The SARS-CoV-2 RNA is generally detectable in upper and lower respiratory specimens during the acute phase of infection. The lowest concentration of SARS-CoV-2 viral copies this assay can detect is 250 copies / mL. A negative result does not preclude SARS-CoV-2 infection and should not be used as the sole basis for treatment or other patient management decisions.  A negative result may occur with improper specimen collection / handling, submission of specimen other than nasopharyngeal swab, presence of viral mutation(s) within the areas targeted by this assay, and inadequate number of viral copies (<250 copies / mL). A negative result must be combined with clinical observations, patient history, and epidemiological information.  Fact Sheet for Patients:   https://www.patel.info/  Fact Sheet for Healthcare Providers: https://hall.com/  This test is not yet approved or  cleared by the [place] FDA and has been authorized for detection and/or diagnosis of SARS-CoV-2 by FDA under an Emergency Use Authorization (EUA).  This EUA will  remain in effect (meaning this test can be used) for the duration of  the COVID-19 declaration under Section 564(b)(1) of the Act, 21 U.S.C. section 360bbb-3(b)(1), unless the authorization is terminated or revoked sooner.  Performed at [facility], [address], [place], [place] [address]   Blood culture (routine x 2)     Status: None (Preliminary result)   Collection Time: 08/06/22  9:55 PM   Specimen: BLOOD  Result Value Ref Range Status   Specimen Description BLOOD LEFT ANTECUBITAL  Final   Special Requests   Final    BOTTLES DRAWN AEROBIC AND ANAEROBIC Blood Culture results may not be optimal due to an inadequate volume of blood received in culture bottles   Culture   Final    NO GROWTH 3 DAYS Performed at [facility], [address], [place], [place] [address]    Report Status PENDING  Incomplete  Blood culture (routine x 2)     Status: None (Preliminary result)   Collection Time: 08/06/22 10:01 PM   Specimen: BLOOD RIGHT HAND  Result Value Ref Range Status   Specimen Description BLOOD RIGHT HAND  Final   Special Requests   Final    BOTTLES DRAWN AEROBIC AND ANAEROBIC Blood Culture adequate volume   Culture  Setup Time   Final    GRAM POSITIVE RODS AEROBIC BOTTLE ONLY CRITICAL RESULT CALLED TO, READ BACK BY AND VERIFIED WITH: T RUDISILL,PHARMD@0029  08/09/22 MK    Culture   Final    NO GROWTH 3 DAYS Performed at [facility], [address] [place], [place] [address]    Report Status PENDING  Incomplete  Urine Culture     Status: Abnormal   Collection Time: 08/06/22 11:47 PM   Specimen: Urine, Clean Catch  Result Value Ref Range Status   Specimen Description URINE, CLEAN CATCH  Final   Special Requests   Final    NONE Performed at [facility], [address] [place], [place] [address]    Culture MULTIPLE SPECIES PRESENT, SUGGEST RECOLLECTION (A)  Final   Report Status 08/08/2022 FINAL  Final     Radiology Studies: No results found.   Pamella Pert, MD, PhD [facility]  Between 7 am - 7  pm I am available, please contact me via Amion (for emergencies) or Securechat (non urgent messages)  Between 7 pm - 7 am I am not available, please contact night coverage MD/APP via Amion

## 2022-08-09 NOTE — Progress Notes (Signed)
OT Cancellation Note  Patient Details Name: Leala Bryand MRN: [id number] DOB: 05/24/1956   Cancelled Treatment:    Reason Eval/Treat Not Completed: Patient at procedure or test/ unavailable (surgery scheduled today for sacral debridement. Will follow up later date.)  [facility] 08/09/2022, 9:12 AM Maurie Boettcher, OT/L   Acute OT Clinical Specialist [place] Pager [phone] Office [phone]

## 2022-08-09 NOTE — Op Note (Signed)
08/06/2022 - 08/09/2022  12:22 PM  PATIENT:  Connie Wilson  66 y.o. female  PRE-OPERATIVE DIAGNOSIS:  gluteal wound  POST-OPERATIVE DIAGNOSIS:  gluteal wound  PROCEDURE:  Procedure(s): DEBRIDEMENT GLUTEAL WOUND X 3:  BLISTER 5 X 2.5 CM LARGE ESCHAR 12 X 10CM X 2CM DEEP SMALL ESCHAR 8 X 3CM X 1CM DEEP  SURGEON:  Surgeon(s): Georganna Skeans, MD  ASSISTANTS: none   ANESTHESIA:   general  EBL:  Total I/O In: -  Out: 650 [Urine:650]  BLOOD ADMINISTERED:none  DRAINS: none   SPECIMEN:  Excision  DISPOSITION OF SPECIMEN:  PATHOLOGY  COUNTS:  YES  DICTATION: .Dragon Dictation Excisional debridement:  1.  Procedure in detail: Informed consent was obtained.  She is currently on IV antibiotics.  She was brought the operating room and general endotracheal anesthesia was administered by the anesthesia staff.  She was placed in prone position with appropriate padding.  Her gluteal area was prepped and draped in a sterile fashion.  Timeout procedure was performed.  Attention was first directed to a blister on her right lower buttock that was 5 x 2.5 cm.  I debrided off the clear overlying skin.  It was viable underneath.  Next, attention was directed to the large eschar on the right buttock area.  This was debrided off using scissors and cautery.  Debridement including necrotic skin, fat, and some fascia and muscle.  The debrided tissue was sent to pathology.  Cautery was used to get good hemostasis.  Next, I debrided the smaller eschar over on the medial left buttock.  This just included necrotic skin and subcutaneous fat.  This tissue was sent to pathology and I got hemostasis of the wound.  All 3 wounds were irrigated out.  Hemostasis was ensured.  And then placed a sterile wet-to-dry dressing over each.  Bulky gauze was placed over the top.  All counts were correct.  She tolerated the procedure without apparent complication and was taken recovery in stable condition.  2.  Tool used  for debridement (curette, scapel, etc.)  scissors and cautery  3.  Frequency of surgical debridement.   First time  4.  Measurement of total devitalized tissue (wound surface) before and after surgical debridement.    BLISTER 5 X 2.5 CM - viable LARGE ESCHAR 12 X 10CM X 2CM DEEP SMALL ESCHAR 8 X 3CM X 1CM DEEP  5.  Area and depth of devitalized tissue removed from wound.  above  6.  Blood loss and description of tissue removed.  Blister was thin skin, large eschar was necrotic skin, fat, and muscle, small eschar was necrotic skin and fat  7.  Evidence of the progress of the wound's response to treatment.  A.  Current wound volume (current dimensions and depth).   BLISTER 5 X 2.5 CM LARGE ESCHAR 12 X 10CM X 2CM DEEP SMALL ESCHAR 8 X 3CM X 1CM DEEP  B.  Presence (and extent of) of infection.  present  C.  Presence (and extent of) of non viable tissue.  present  D.  Other material in the wound that is expected to inhibit healing.  no  8.  Was there any viable tissue removed (measurements): minimal  PATIENT DISPOSITION:  PACU - hemodynamically stable.   Delay start of Pharmacological VTE agent (>24hrs) due to surgical blood loss or risk of bleeding:  no  Georganna Skeans, MD, MPH, FACS Pager: [phone]  10/31/202312:22 PM

## 2022-08-09 NOTE — Progress Notes (Signed)
Patient ID: Connie Wilson, female   DOB: [birth year], 66 y.o.   MRN: [id number] Day of Surgery    Subjective: In pre-op, no new complaints ROS negative except as listed above. Objective: Vital signs in last 24 hours: Temp:  [97.5 F (36.4 C)-98.8 F (37.1 C)] 98.8 F (37.1 C) (10/31 0819) Pulse Rate:  [87-89] 88 (10/31 0949) Resp:  [16-18] 17 (10/31 0949) BP: (142-169)/(80-101) 169/89 (10/31 0949) SpO2:  [96 %-100 %] 97 % (10/31 0949) Weight:  [91.5 kg] 91.5 kg (10/30 1301) Last BM Date : 08/07/22  Intake/Output from previous day: 10/30 0701 - 10/31 0700 In: 91 [IV Piggyback:91] Out: 1200 [Urine:1200] Intake/Output this shift: No intake/output data recorded.  General appearance: alert and cooperative Resp: clear to auscultation bilaterally Cardio: S1, S2 normal GI: soft, NT Gluteal wound dressed  Lab Results: CBC  Recent Labs    08/08/22 0433 08/09/22 0349  WBC 15.2* 10.8*  HGB 11.9* 11.4*  HCT 35.5* 33.5*  PLT 202 182   BMET Recent Labs    08/08/22 0433 08/09/22 0349  NA 139 137  K 3.0* 3.2*  CL 110 107  CO2 21* 24  GLUCOSE 95 199*  BUN 22 13  CREATININE 0.84 0.83  CALCIUM 8.4* 8.1*   PT/INR No results for input(s): "LABPROT", "INR" in the last 72 hours. ABG No results for input(s): "PHART", "HCO3" in the last 72 hours.  Invalid input(s): "PCO2", "PO2"  Studies/Results:   Anti-infectives: Anti-infectives (From admission, onward)    Start     Dose/Rate Route Frequency Ordered Stop   08/07/22 2300  vancomycin (VANCOCIN) IVPB 1000 mg/200 mL premix  Status:  Discontinued        1,000 mg 200 mL/hr over 60 Minutes Intravenous Every 24 hours 08/06/22 2155 08/07/22 1159   08/07/22 2300  [MAR Hold]  vancomycin (VANCOREADY) IVPB 1250 mg/250 mL        (MAR Hold since Tue 08/09/2022 at 0929.Hold Reason: Transfer to a Procedural area)   1,250 mg 166.7 mL/hr over 90 Minutes Intravenous Every 24 hours 08/07/22 1159     08/07/22 0600   piperacillin-tazobactam (ZOSYN) IVPB 3.375 g  Status:  Discontinued        3.375 g 12.5 mL/hr over 240 Minutes Intravenous Every 8 hours 08/06/22 2155 08/07/22 0137   08/07/22 0600  [MAR Hold]  piperacillin-tazobactam (ZOSYN) IVPB 3.375 g        (MAR Hold since Tue 08/09/2022 at 0929.Hold Reason: Transfer to a Procedural area)   3.375 g 12.5 mL/hr over 240 Minutes Intravenous Every 8 hours 08/07/22 0142     08/07/22 0145  piperacillin-tazobactam (ZOSYN) IVPB 3.375 g  Status:  Discontinued        3.375 g 100 mL/hr over 30 Minutes Intravenous Every 8 hours 08/07/22 0136 08/07/22 0141   08/06/22 2130  piperacillin-tazobactam (ZOSYN) IVPB 3.375 g        3.375 g 100 mL/hr over 30 Minutes Intravenous  Once 08/06/22 2116 08/06/22 2301   08/06/22 2130  vancomycin (VANCOREADY) IVPB 1500 mg/300 mL        1,500 mg 150 mL/hr over 120 Minutes Intravenous  Once 08/06/22 2116 08/07/22 0039       Assessment/Plan: Unstageable gluteal wound  - Patient likely developed a pressure wound after being found down at home for unknown period of time. For debridement in the OR this AM. Procedure, risks, and benefits D/W her daughter in light of patient's MS changes. Consent documented.   ID - zosyn/vancomycin VTE -  sq heparin FEN - CM diet Foley - none   Acute metabolic encephalopathy, found down DM HTN Chronic CVA    LOS: 2 days    Violeta Gelinas, MD, MPH, FACS Trauma & General Surgery Use [email or web address] to contact on call provider  08/09/2022

## 2022-08-09 NOTE — Progress Notes (Signed)
Pt daughter was informed of the time for procedure @1021am . Daughter also wanted the surgery team to know that the pt doesn't do well with anaesthesia or high dose of pain meds. Daughter stated "they know this after a pervious surgery she had it made her very confused.

## 2022-08-09 NOTE — Anesthesia Preprocedure Evaluation (Signed)
Anesthesia Evaluation  Patient identified by MRN, date of birth, ID band Patient awake    Reviewed: Allergy & Precautions, H&P , NPO status , Patient's Chart, lab work & pertinent test results  Airway Mallampati: II   Neck ROM: full    Dental   Pulmonary former smoker,    breath sounds clear to auscultation       Cardiovascular hypertension,  Rhythm:regular Rate:Normal     Neuro/Psych PSYCHIATRIC DISORDERS Anxiety Depression    GI/Hepatic   Endo/Other  diabetes, Type 2  Renal/GU      Musculoskeletal  (+) Arthritis ,   Abdominal   Peds  Hematology   Anesthesia Other Findings   Reproductive/Obstetrics                             Anesthesia Physical Anesthesia Plan  ASA: 2  Anesthesia Plan: General   Post-op Pain Management:    Induction: Intravenous  PONV Risk Score and Plan: 3 and Ondansetron, Dexamethasone, Midazolam and Treatment may vary due to age or medical condition  Airway Management Planned: Oral ETT  Additional Equipment:   Intra-op Plan:   Post-operative Plan: Extubation in OR  Informed Consent: I have reviewed the patients History and Physical, chart, labs and discussed the procedure including the risks, benefits and alternatives for the proposed anesthesia with the patient or authorized representative who has indicated his/her understanding and acceptance.     Dental advisory given  Plan Discussed with: CRNA, Anesthesiologist and Surgeon  Anesthesia Plan Comments:         Anesthesia Quick Evaluation

## 2022-08-09 NOTE — Progress Notes (Signed)
Trauma on call physician paged and notified of medication hold.  Per surgical MD she could not fix medication hold.  Pharmacy placed new times for antibiotics and insulin.  Medication were able to be scanned and administered after this change.

## 2022-08-09 NOTE — Progress Notes (Signed)
PHARMACY - PHYSICIAN COMMUNICATION CRITICAL VALUE ALERT - BLOOD CULTURE IDENTIFICATION (BCID)  Connie Wilson is an 66 y.o. female who presented to [facility] on 08/06/2022 with a chief complaint of being found down for unknown period of time  Assessment:  Gram + rods in 1 of 4 bottles  Name of physician (or Provider) Contacted: [place]  Current antibiotics: vancomycin and zosyn - going to OR for wound washout  Changes to prescribed antibiotics recommended:  Patient is on recommended antibiotics - No changes needed  No results found for this or any previous visit.  Jodean Lima Kailyn Dubie 08/09/2022  12:45 AM

## 2022-08-09 NOTE — Transfer of Care (Signed)
Immediate Anesthesia Transfer of Care Note  Patient: Maxi Carreras  Procedure(s) Performed: [place]  Patient Location: PACU  Anesthesia Type:General  Level of Consciousness: awake  Airway & Oxygen Therapy: Patient Spontanous Breathing  Post-op Assessment: Report given to RN and Post -op Vital signs reviewed and stable  Post vital signs: Reviewed and stable  Last Vitals:  Vitals Value Taken Time  BP 159/105 08/09/22 1300  Temp 36.7 C 08/09/22 1300  Pulse 87 08/09/22 1302  Resp 12 08/09/22 1302  SpO2 94 % 08/09/22 1302  Vitals shown include unvalidated device data.  Last Pain:  Vitals:   08/09/22 1300  TempSrc:   PainSc: 0-No pain         Complications: No notable events documented.

## 2022-08-09 NOTE — Progress Notes (Signed)
Patient returned to unit after surgical procedure and orders were released.  After reconciliation, medications on MAR were on hold.  This nurse asked charge nurse how to change the status.  Pharmacy consulted regarding medications on hold.  Per Pharmacy they could not change held medications.  Stated "it had to do with Epic and the transfer between units."  Pharmacy placed new times on [facility] for medications but still unable to scan due to Hold status.  Charge nurse notified.  MD notified of situation with medications.  This nurse asked MD if orders could be discontinued and put in as new orders.  MD stated "he was not spending the next 30 minutes fixing medication orders to consult surgical team."

## 2022-08-09 NOTE — Anesthesia Procedure Notes (Signed)
Procedure Name: Intubation Date/Time: 08/09/2022 11:36 AM  Performed by: Lorie Phenix, CRNAPre-anesthesia Checklist: Patient identified, Emergency Drugs available, Suction available and Patient being monitored Patient Re-evaluated:Patient Re-evaluated prior to induction Oxygen Delivery Method: Circle system utilized Preoxygenation: Pre-oxygenation with 100% oxygen Induction Type: IV induction Ventilation: Mask ventilation without difficulty Laryngoscope Size: Mac and 3 Grade View: Grade I Tube type: Oral Number of attempts: 1 Airway Equipment and Method: Stylet Placement Confirmation: ETT inserted through vocal cords under direct vision, positive ETCO2 and breath sounds checked- equal and bilateral Secured at: 22 cm Tube secured with: Tape Dental Injury: Teeth and Oropharynx as per pre-operative assessment

## 2022-08-09 NOTE — Consult Note (Signed)
[facility] Nurse wound follow up Patient has surgical debridement in OR today; post operative dressing recommendation is for twice daily NS at this time due to proximity of wound to anus.  Turning and repositioning is in place and patient remains on a mattress replacement with low air loss feature. DermaTherapy therapeutic bedding system is in place with low coefficient of friction (CoF). I have communicated with [facility] PA-C M. Sherol Dade and she has provided guidance for Nursing via the Orders. [facility] will follow.  [place] nursing team will not follow, but will remain available to this patient, the nursing and medical teams.  Please re-consult if needed.  Thank you for inviting [place] to participate in this patient's Plan of Care.  Maudie Flakes, MSN, RN, CNS, [place], Serita Grammes, [employer], [employer] phone:  [phone]

## 2022-08-09 NOTE — Progress Notes (Signed)
PT Cancellation Note  Patient Details Name: Connie Wilson MRN: [id number] DOB: 09-Apr-1956   Cancelled Treatment:    Reason Eval/Treat Not Completed: Patient at procedure or test/unavailable. Will check back at another time.   Leighton Roach, PT  Acute Rehab Services Secure chat preferred Office [place] 08/09/2022, 12:09 PM

## 2022-08-10 ENCOUNTER — Encounter (HOSPITAL_COMMUNITY): Payer: Self-pay | Admitting: General Surgery

## 2022-08-10 DIAGNOSIS — G9341 Metabolic encephalopathy: Secondary | ICD-10-CM | POA: Diagnosis not present

## 2022-08-10 DIAGNOSIS — L98499 Non-pressure chronic ulcer of skin of other sites with unspecified severity: Secondary | ICD-10-CM | POA: Diagnosis not present

## 2022-08-10 DIAGNOSIS — R6510 Systemic inflammatory response syndrome (SIRS) of non-infectious origin without acute organ dysfunction: Secondary | ICD-10-CM

## 2022-08-10 DIAGNOSIS — L03312 Cellulitis of back [any part except buttock]: Secondary | ICD-10-CM | POA: Diagnosis not present

## 2022-08-10 LAB — CBC
HCT: 32.2 % — ABNORMAL LOW (ref 36.0–46.0)
Hemoglobin: 10.9 g/dL — ABNORMAL LOW (ref 12.0–15.0)
MCH: 28.3 pg (ref 26.0–34.0)
MCHC: 33.9 g/dL (ref 30.0–36.0)
MCV: 83.6 fL (ref 80.0–100.0)
Platelets: 194 10*3/uL (ref 150–400)
RBC: 3.85 MIL/uL — ABNORMAL LOW (ref 3.87–5.11)
RDW: 14.3 % (ref 11.5–15.5)
WBC: 12.9 10*3/uL — ABNORMAL HIGH (ref 4.0–10.5)
nRBC: 0 % (ref 0.0–0.2)

## 2022-08-10 LAB — CBC WITH DIFFERENTIAL/PLATELET
Abs Immature Granulocytes: 0.08 10*3/uL — ABNORMAL HIGH (ref 0.00–0.07)
Basophils Absolute: 0 10*3/uL (ref 0.0–0.1)
Basophils Relative: 0 %
Eosinophils Absolute: 0.3 10*3/uL (ref 0.0–0.5)
Eosinophils Relative: 3 %
HCT: 31.6 % — ABNORMAL LOW (ref 36.0–46.0)
Hemoglobin: 10.8 g/dL — ABNORMAL LOW (ref 12.0–15.0)
Immature Granulocytes: 1 %
Lymphocytes Relative: 21 %
Lymphs Abs: 2.3 10*3/uL (ref 0.7–4.0)
MCH: 28.7 pg (ref 26.0–34.0)
MCHC: 34.2 g/dL (ref 30.0–36.0)
MCV: 84 fL (ref 80.0–100.0)
Monocytes Absolute: 0.7 10*3/uL (ref 0.1–1.0)
Monocytes Relative: 7 %
Neutro Abs: 7.6 10*3/uL (ref 1.7–7.7)
Neutrophils Relative %: 68 %
Platelets: 194 10*3/uL (ref 150–400)
RBC: 3.76 MIL/uL — ABNORMAL LOW (ref 3.87–5.11)
RDW: 14.3 % (ref 11.5–15.5)
Smear Review: NORMAL
WBC: 11.1 10*3/uL — ABNORMAL HIGH (ref 4.0–10.5)
nRBC: 0 % (ref 0.0–0.2)

## 2022-08-10 LAB — COMPREHENSIVE METABOLIC PANEL
ALT: 25 U/L (ref 0–44)
AST: 23 U/L (ref 15–41)
Albumin: 1.9 g/dL — ABNORMAL LOW (ref 3.5–5.0)
Alkaline Phosphatase: 66 U/L (ref 38–126)
Anion gap: 14 (ref 5–15)
BUN: 11 mg/dL (ref 8–23)
CO2: 20 mmol/L — ABNORMAL LOW (ref 22–32)
Calcium: 8.5 mg/dL — ABNORMAL LOW (ref 8.9–10.3)
Chloride: 103 mmol/L (ref 98–111)
Creatinine, Ser: 0.69 mg/dL (ref 0.44–1.00)
GFR, Estimated: >60 mL/min (ref >60)
Glucose, Bld: 156 mg/dL — ABNORMAL HIGH (ref 70–99)
Potassium: 3.4 mmol/L — ABNORMAL LOW (ref 3.5–5.1)
Sodium: 137 mmol/L (ref 135–145)
Total Bilirubin: 0.3 mg/dL (ref 0.3–1.2)
Total Protein: 5.3 g/dL — ABNORMAL LOW (ref 6.5–8.1)

## 2022-08-10 LAB — LIPID PANEL
Cholesterol: 149 mg/dL (ref 0–200)
HDL: 28 mg/dL — ABNORMAL LOW (ref >40)
LDL Cholesterol: 87 mg/dL (ref 0–99)
Total CHOL/HDL Ratio: 5.3 RATIO
Triglycerides: 171 mg/dL — ABNORMAL HIGH (ref <150)
VLDL: 34 mg/dL (ref 0–40)

## 2022-08-10 LAB — SURGICAL PATHOLOGY

## 2022-08-10 LAB — GLUCOSE, CAPILLARY
Glucose-Capillary: 112 mg/dL — ABNORMAL HIGH (ref 70–99)
Glucose-Capillary: 192 mg/dL — ABNORMAL HIGH (ref 70–99)
Glucose-Capillary: 140 mg/dL — ABNORMAL HIGH (ref 70–99)
Glucose-Capillary: 206 mg/dL — ABNORMAL HIGH (ref 70–99)

## 2022-08-10 LAB — MAGNESIUM: Magnesium: 1.8 mg/dL (ref 1.7–2.4)

## 2022-08-10 MED ORDER — MORPHINE SULFATE (PF) 2 MG/ML IV SOLN
1.0000 mg | INTRAVENOUS | Status: DC | PRN
  Administered 2022-08-11: 1 mg via INTRAVENOUS
  Filled 2022-08-10: qty 1

## 2022-08-10 MED ORDER — SODIUM CHLORIDE 0.9 % IV SOLN: INTRAVENOUS | Status: AC

## 2022-08-10 MED ORDER — CLONIDINE HCL 0.2 MG PO TABS
0.2000 mg | ORAL_TABLET | Freq: Two times a day (BID) | ORAL | Status: DC
  Administered 2022-08-10: 0.2 mg via ORAL
  Filled 2022-08-10: qty 1

## 2022-08-10 MED ORDER — LISINOPRIL 20 MG PO TABS
20.0000 mg | ORAL_TABLET | Freq: Every day | ORAL | Status: DC
  Administered 2022-08-10: 20 mg via ORAL
  Filled 2022-08-10: qty 1

## 2022-08-10 MED ORDER — CALCIUM CITRATE 950 (200 CA) MG PO TABS
1.0000 | ORAL_TABLET | Freq: Two times a day (BID) | ORAL | Status: DC
  Administered 2022-08-10 – 2022-08-17 (×15): 200 mg via ORAL
  Filled 2022-08-10 (×15): qty 1

## 2022-08-10 MED ORDER — CLONIDINE HCL 0.1 MG PO TABS: 0.1000 mg | ORAL_TABLET | Freq: Two times a day (BID) | ORAL | Status: DC

## 2022-08-10 MED ORDER — POTASSIUM CHLORIDE CRYS ER 20 MEQ PO TBCR
40.0000 meq | EXTENDED_RELEASE_TABLET | Freq: Two times a day (BID) | ORAL | Status: AC
  Administered 2022-08-11 (×2): 40 meq via ORAL
  Filled 2022-08-10 (×2): qty 2

## 2022-08-10 NOTE — Progress Notes (Signed)
Progress Note  1 Day Post-Op  Subjective: Having some expected pain from her debridement. Hungry for breakfast  Objective: Vital signs in last 24 hours: Temp:  [97.9 F (36.6 C)-98.3 F (36.8 C)] 97.9 F (36.6 C) (11/01 0919) Pulse Rate:  [83-98] 83 (10/31 1324) Resp:  [12-17] 16 (11/01 0919) BP: (150-180)/(59-105) 165/84 (11/01 0919) SpO2:  [91 %-97 %] 94 % (10/31 2330) Last BM Date : 08/07/22  Intake/Output from previous day: 10/31 0701 - 11/01 0700 In: 600 [I.V.:600] Out: 1660 [Urine:1650; Blood:10] Intake/Output this shift: Total I/O In: -  Out: 700 [Urine:700]  PE: General: pleasant, WD, female who is laying in bed in NAD Lungs: Respiratory effort nonlabored Abd: soft, NT, ND MSK: all 4 extremities are symmetrical with no cyanosis, clubbing, or edema. Skin: warm and dry. Superior gluteal wounds with appropriate bleeding from wound edges. Right side wound with dry but soft wound base. Caudal to open wounds there is maceration and epidermal sloughing to bilateral buttocks. See below Psych: A&Ox3 with an appropriate affect.        Lab Results:  Recent Labs    08/09/22 0349 08/10/22 0453  WBC 10.8* 12.9*  HGB 11.4* 10.9*  HCT 33.5* 32.2*  PLT 182 194   BMET Recent Labs    08/09/22 0349 08/10/22 0453  NA 137 137  K 3.2* 3.4*  CL 107 103  CO2 24 20*  GLUCOSE 199* 156*  BUN 13 11  CREATININE 0.83 0.69  CALCIUM 8.1* 8.5*   PT/INR No results for input(s): "LABPROT", "INR" in the last 72 hours. CMP     Component Value Date/Time   NA 137 08/10/2022 0453   NA 139 02/06/2017 1010   K 3.4 (L) 08/10/2022 0453   CL 103 08/10/2022 0453   CO2 20 (L) 08/10/2022 0453   GLUCOSE 156 (H) 08/10/2022 0453   BUN 11 08/10/2022 0453   BUN 11 02/06/2017 1010   CREATININE 0.69 08/10/2022 0453   CREATININE 0.81 04/01/2016 0854   CALCIUM 8.5 (L) 08/10/2022 0453   PROT 5.3 (L) 08/10/2022 0453   ALBUMIN 1.9 (L) 08/10/2022 0453   AST 23 08/10/2022 0453   ALT  25 08/10/2022 0453   ALKPHOS 66 08/10/2022 0453   BILITOT 0.3 08/10/2022 0453   GFRNONAA >60 08/10/2022 0453   GFRAA 95 02/06/2017 1010   Lipase     Component Value Date/Time   LIPASE 91.0 (H) 01/08/2021 0938       Studies/Results: ECHOCARDIOGRAM COMPLETE  Result Date: 08/08/2022    ECHOCARDIOGRAM REPORT   Patient Name:   Connie Wilson Date of Exam: 08/08/2022 Medical Rec #:  [id number]       Height:       68.0 in Accession #:    [id number]      Weight:       216.8 lb Date of Birth:  1955/12/31       BSA:          2.115 m Patient Age:    66 years        BP:           125/73 mmHg Patient Gender: F               HR:           108 bpm. Exam Location:  Inpatient Procedure: 2D Echo Indications:    syncope  History:        Patient has prior history of Echocardiogram examinations, most  recent 11/30/2021. Risk Factors:Diabetes and Hypertension.  Sonographer:    Delcie Roch RDCS Referring Phys: [id number] SARA-MAIZ A THOMAS  Sonographer Comments: Image acquisition challenging due to patient body habitus. IMPRESSIONS  1. Left ventricular ejection fraction, by estimation, is 55%. The left ventricle has normal function. Left ventricular endocardial border not optimally defined to evaluate regional wall motion. Left ventricular diastolic parameters are consistent with Grade I diastolic dysfunction (impaired relaxation). There is abnormal septal motion secondary to conduction delay.  2. Right ventricular systolic function is mildly reduced particularly at the RV apex. The right ventricular size is normal. Tricuspid regurgitation signal is inadequate for assessing PA pressure.  3. The mitral valve is normal in structure. Trivial mitral valve regurgitation. No evidence of mitral stenosis.  4. The aortic valve is tricuspid. Aortic valve regurgitation is not visualized. No aortic stenosis is present.  5. The inferior vena cava is normal in size with greater than 50% respiratory variability,  suggesting right atrial pressure of 3 mmHg. FINDINGS  Left Ventricle: Left ventricular ejection fraction, by estimation, is 55%. The left ventricle has normal function. Left ventricular endocardial border not optimally defined to evaluate regional wall motion. The left ventricular internal cavity size was normal in size. There is no left ventricular hypertrophy. Abnormal septal motion secondary to conduction delay. Left ventricular diastolic parameters are consistent with Grade I diastolic dysfunction (impaired relaxation). Right Ventricle: The right ventricular size is normal. No increase in right ventricular wall thickness. Right ventricular systolic function is mildly reduced. Tricuspid regurgitation signal is inadequate for assessing PA pressure. Left Atrium: Left atrial size was normal in size. Right Atrium: Right atrial size was normal in size. Pericardium: There is no evidence of pericardial effusion. Mitral Valve: The mitral valve is normal in structure. Trivial mitral valve regurgitation. No evidence of mitral valve stenosis. Tricuspid Valve: The tricuspid valve is normal in structure. Tricuspid valve regurgitation is trivial. No evidence of tricuspid stenosis. Aortic Valve: The aortic valve is tricuspid. Aortic valve regurgitation is not visualized. No aortic stenosis is present. Pulmonic Valve: The pulmonic valve was normal in structure. Pulmonic valve regurgitation is not visualized. No evidence of pulmonic stenosis. Aorta: The aortic root is normal in size and structure. Venous: The inferior vena cava is normal in size with greater than 50% respiratory variability, suggesting right atrial pressure of 3 mmHg. IAS/Shunts: No atrial level shunt detected by color flow Doppler.  LEFT VENTRICLE PLAX 2D LVIDd:         4.50 cm     Diastology LVIDs:         3.20 cm     LV e' medial:    6.53 cm/s LV PW:         0.80 cm     LV E/e' medial:  7.2 LV IVS:        0.90 cm     LV e' lateral:   7.51 cm/s LVOT diam:      2.00 cm     LV E/e' lateral: 6.2 LV SV:         60 LV SV Index:   29 LVOT Area:     3.14 cm  LV Volumes (MOD) LV vol d, MOD A2C: 60.2 ml LV vol s, MOD A2C: 29.2 ml LV SV MOD A2C:     31.0 ml RIGHT VENTRICLE             IVC RV S prime:     11.30 cm/s  IVC diam: 1.40 cm TAPSE (M-mode):  1.2 cm LEFT ATRIUM             Index        RIGHT ATRIUM           Index LA diam:        3.20 cm 1.51 cm/m   RA Area:     10.10 cm LA Vol (A2C):   30.9 ml 14.61 ml/m  RA Volume:   19.40 ml  9.17 ml/m LA Vol (A4C):   36.0 ml 17.02 ml/m LA Biplane Vol: 34.3 ml 16.22 ml/m  AORTIC VALVE LVOT Vmax:   103.00 cm/s LVOT Vmean:  69.700 cm/s LVOT VTI:    0.192 m  AORTA Ao Root diam: 2.90 cm Ao Asc diam:  3.30 cm MITRAL VALVE MV Area (PHT): 4.49 cm    SHUNTS MV Decel Time: 169 msec    Systemic VTI:  0.19 m MV E velocity: 46.70 cm/s  Systemic Diam: 2.00 cm MV A velocity: 63.60 cm/s MV E/A ratio:  0.73 Cherlynn Kaiser MD Electronically signed by Cherlynn Kaiser MD Signature Date/Time: 08/08/2022/12:14:51 PM    Final     Anti-infectives: Anti-infectives (From admission, onward)    Start     Dose/Rate Route Frequency Ordered Stop   08/09/22 1800  piperacillin-tazobactam (ZOSYN) IVPB 3.375 g        3.375 g 12.5 mL/hr over 240 Minutes Intravenous Every 8 hours 08/09/22 1733     08/07/22 2300  vancomycin (VANCOCIN) IVPB 1000 mg/200 mL premix  Status:  Discontinued        1,000 mg 200 mL/hr over 60 Minutes Intravenous Every 24 hours 08/06/22 2155 08/07/22 1159   08/07/22 2300  vancomycin (VANCOREADY) IVPB 1250 mg/250 mL        1,250 mg 166.7 mL/hr over 90 Minutes Intravenous Every 24 hours 08/07/22 1159     08/07/22 0600  piperacillin-tazobactam (ZOSYN) IVPB 3.375 g  Status:  Discontinued        3.375 g 12.5 mL/hr over 240 Minutes Intravenous Every 8 hours 08/06/22 2155 08/07/22 0137   08/07/22 0600  piperacillin-tazobactam (ZOSYN) IVPB 3.375 g  Status:  Discontinued        3.375 g 12.5 mL/hr over 240 Minutes Intravenous Every  8 hours 08/07/22 0142 08/09/22 1733   08/07/22 0145  piperacillin-tazobactam (ZOSYN) IVPB 3.375 g  Status:  Discontinued        3.375 g 100 mL/hr over 30 Minutes Intravenous Every 8 hours 08/07/22 0136 08/07/22 0141   08/06/22 2130  piperacillin-tazobactam (ZOSYN) IVPB 3.375 g        3.375 g 100 mL/hr over 30 Minutes Intravenous  Once 08/06/22 2116 08/06/22 2301   08/06/22 2130  vancomycin (VANCOREADY) IVPB 1500 mg/300 mL        1,500 mg 150 mL/hr over 120 Minutes Intravenous  Once 08/06/22 2116 08/07/22 0039        Assessment/Plan Gluteal wound POD1 s/p OR debridement 10/31 Dr. Grandville Silos - wound cultures pending. Continue IV abx. WBC 12.9 (10.8) - continue bid wet to dry dressing changes. No further surgical debridement indicated at this time. Wound on right may benefit from chemical debridement (medihoney vs santyl) in the future pending improving with wet to dry dressings  FEN: carb mod ID: [email or web address] VTE: hep subq  I reviewed hospitalist notes, last 24 h vitals and pain scores, last 48 h intake and output, last 24 h labs and trends, and last 24 h imaging results.    LOS: 3 days   Beckie Busing  [facility] 08/10/2022, 10:40 AM Please see Amion for pager number during day hours 7:00am-4:30pm

## 2022-08-10 NOTE — Progress Notes (Signed)
[facility] PROGRESS NOTE    Progress Note  Connie Wilson  CHY:[id number] DOB: [birth year] DOA: 08/06/2022 PCP: Paschal Dopp, PA     Brief Narrative:   Connie Wilson is an 66 y.o. female past medical history of diabetes mellitus type 2, essential hypertension TIA recurrent UTIs who presents to the ED after being found down at home she lives alone last time she was seen was about a week ago upon EMS arrival they found her confused soiled in feces and urine.   Assessment/Plan:   SIRS: Source is not entirely clear question significant results are versus UTI. 1 out of 4 blood cultures grew gram-positive rods likely contaminant. White blood count today is 13 she has remained afebrile. Rise in WBC like due to I&D. Continue IV vancomycin and Zosyn.  Acute metabolic encephalopathy: Likely due to #1, was found on the floor for unclear amount of time thankfully her mental status seems to be improved with supportive treatment. No clear-cut of inciting incident.  History of CVA: Incidentally  on MRI, neurology was curb sided recommended no further management. Continue aspirin and statins. Physical therapy evaluation was done and she will need inpatient rehab.  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  Per daughter she is  independent and  driving  and was at works as a Lawyer until a couple of weeks ago. General surgery was consulted she was taken to the OR on 10/09/2022 for debridement. Wound cultures are pending. Surgery recommended twice daily wet-to-dry dressing no further debridement.  She will probably benefit from chemical debridement (Santyl) in the future.  Controlled diabetes mellitus type 2: Last A1c of 6.6 to, relatively well-controlled sliding scale insulin continue current regimen.  Essential hypertension: Her pressure is low post op She was restarted on lisinopril and clonidine, will hold  lisinopril, decrease clonidine start IV fluids.  Depression: Continue Celexa.  Hypokalemia: Repleted now resolved.  Hypomagnesemia: Check a mag in am.  DVT prophylaxis: Lovenox Family Communication:None Status is: Inpatient Remains inpatient appropriate because: SIRS with stage IV sacral decub    Code Status:     Code Status Orders  (From admission, onward)           Start     Ordered   08/07/22 0134  Full code  Continuous        08/07/22 0136           Code Status History     Date Active Date Inactive Code Status Order ID Comments User Context   04/06/2013 1828 04/06/2013 2256 Full Code [id number]  Fayrene Helper, PA-C ED         IV Access:   Peripheral IV   Procedures and diagnostic studies:   No results found.   Medical Consultants:   None.   Subjective:    Connie Wilson sleepy today, pain is controlled.  Objective:    Vitals:   08/10/22 1246 08/10/22 1750 08/10/22 1754 08/10/22 1821  BP: 118/84 (!) 88/58 (!) 94/56 (!) 95/57  Pulse:  80 74   Resp:  16    Temp:  97.7 F (36.5 C)    TempSrc:  Oral    SpO2:  100%    Weight:       SpO2: 100 %   Intake/Output Summary (Last 24 hours) at 08/10/2022 2025 Last data filed at 08/10/2022 1600 Gross per 24 hour  Intake 300 ml  Output 1700 ml  Net -1400 ml  Filed Weights   08/08/22 1301  Weight: 91.5 kg    Exam: General exam: In no acute distress. Respiratory system: Good air movement and clear to auscultation. Cardiovascular system: S1 & S2 heard, RRR. No JVD.  Gastrointestinal system: Abdomen is nondistended, soft and nontender.  Extremities: No pedal edema. Skin: stage IV decub Psychiatry: Judgement and insight appear normal. Mood & affect appropriate.    Data Reviewed:    Labs: Basic Metabolic Panel: Recent Labs  Lab 08/06/22 2058 08/07/22 0213 08/07/22 0522 08/08/22 0433 08/09/22 0349 08/10/22 0453  NA 145  --  146* 139 137 137  K 4.1  --  3.8 3.0* 3.2* 3.4*   CL 105  --  113* 110 107 103  CO2 25  --  16* 21* 24 20*  GLUCOSE 163*  --  143* 95 199* 156*  BUN 49*  --  41* 22 13 11   CREATININE 1.14* 1.02* 0.88 0.84 0.83 0.69  CALCIUM 10.5*  --  9.0 8.4* 8.1* 8.5*  MG  --   --   --  1.6* 1.9 1.8   GFR Estimated Creatinine Clearance: 81.8 mL/min (by C-G formula based on SCr of 0.69 mg/dL). Liver Function Tests: Recent Labs  Lab 08/06/22 2058 08/07/22 0522 08/08/22 0433 08/09/22 0349 08/10/22 0453  AST 36 34 32 31 23  ALT 30 25 29 30 25   ALKPHOS 103 80 74 72 66  BILITOT 1.2 1.1 0.8 0.7 0.3  PROT 7.9 6.2* 5.4* 5.2* 5.3*  ALBUMIN 3.0* 2.3* 2.1* 1.8* 1.9*   No results for input(s): "LIPASE", "AMYLASE" in the last 168 hours. Recent Labs  Lab 08/06/22 2058  AMMONIA 16   Coagulation profile No results for input(s): "INR", "PROTIME" in the last 168 hours. COVID-19 Labs  No results for input(s): "DDIMER", "FERRITIN", "LDH", "CRP" in the last 72 hours.  Lab Results  Component Value Date   SARSCOV2NAA NEGATIVE 08/06/2022    CBC: Recent Labs  Lab 08/06/22 2058 08/07/22 0213 08/07/22 0522 08/08/22 0433 08/09/22 0349 08/10/22 0453  WBC 27.8* 28.0* 27.7* 15.2* 10.8* 12.9*  NEUTROABS 24.2*  --   --   --   --   --   HGB 16.9* 14.6 14.8 11.9* 11.4* 10.9*  HCT 50.6* 43.1 46.4* 35.5* 33.5* 32.2*  MCV 84.6 85.9 90.3 84.5 84.6 83.6  PLT 301 238 249 202 182 194   Cardiac Enzymes: Recent Labs  Lab 08/06/22 2058  CKTOTAL 417*   BNP (last 3 results) No results for input(s): "PROBNP" in the last 8760 hours. CBG: Recent Labs  Lab 08/09/22 2009 08/10/22 0818 08/10/22 1213 08/10/22 1632 08/10/22 1749  GLUCAP 224* 112* 192* 140* 206*   D-Dimer: No results for input(s): "DDIMER" in the last 72 hours. Hgb A1c: Recent Labs    08/08/22 0433  HGBA1C 6.2*   Lipid Profile: Recent Labs    08/08/22 0433 08/10/22 0453  CHOL 156 149  HDL 26* 28*  LDLCALC 98 87  TRIG 159* 171*  CHOLHDL 6.0 5.3   Thyroid function  studies: No results for input(s): "TSH", "T4TOTAL", "T3FREE", "THYROIDAB" in the last 72 hours.  Invalid input(s): "FREET3" Anemia work up: No results for input(s): "VITAMINB12", "FOLATE", "FERRITIN", "TIBC", "IRON", "RETICCTPCT" in the last 72 hours. Sepsis Labs: Recent Labs  Lab 08/06/22 2255 08/07/22 0213 08/07/22 0522 08/08/22 0433 08/09/22 0349 08/10/22 0453  WBC  --  28.0* 27.7* 15.2* 10.8* 12.9*  LATICACIDVEN 2.0* 2.0*  --   --   --   --  Microbiology Recent Results (from the past 240 hour(s))  SARS Coronavirus 2 by RT PCR (hospital order, performed in [facility] hospital lab) *cepheid single result test* Anterior Nasal Swab     Status: None   Collection Time: 08/06/22  8:58 PM   Specimen: Anterior Nasal Swab  Result Value Ref Range Status   SARS Coronavirus 2 by RT PCR NEGATIVE NEGATIVE Final    Comment: (NOTE) SARS-CoV-2 target nucleic acids are NOT DETECTED.  The SARS-CoV-2 RNA is generally detectable in upper and lower respiratory specimens during the acute phase of infection. The lowest concentration of SARS-CoV-2 viral copies this assay can detect is 250 copies / mL. A negative result does not preclude SARS-CoV-2 infection and should not be used as the sole basis for treatment or other patient management decisions.  A negative result may occur with improper specimen collection / handling, submission of specimen other than nasopharyngeal swab, presence of viral mutation(s) within the areas targeted by this assay, and inadequate number of viral copies (<250 copies / mL). A negative result must be combined with clinical observations, patient history, and epidemiological information.  Fact Sheet for Patients:   RoadLapTop.co.za  Fact Sheet for Healthcare Providers: http://kim-miller.com/  This test is not yet approved or  cleared by the [place] FDA and has been authorized for detection and/or diagnosis of  SARS-CoV-2 by FDA under an Emergency Use Authorization (EUA).  This EUA will remain in effect (meaning this test can be used) for the duration of the COVID-19 declaration under Section 564(b)(1) of the Act, 21 U.S.C. section 360bbb-3(b)(1), unless the authorization is terminated or revoked sooner.  Performed at [facility], [address], [place], [place] [address]   Blood culture (routine x 2)     Status: None (Preliminary result)   Collection Time: 08/06/22  9:55 PM   Specimen: BLOOD  Result Value Ref Range Status   Specimen Description BLOOD LEFT ANTECUBITAL  Final   Special Requests   Final    BOTTLES DRAWN AEROBIC AND ANAEROBIC Blood Culture results may not be optimal due to an inadequate volume of blood received in culture bottles   Culture   Final    NO GROWTH 4 DAYS Performed at [facility], [address], [place], [place] [address]    Report Status PENDING  Incomplete  Blood culture (routine x 2)     Status: None (Preliminary result)   Collection Time: 08/06/22 10:01 PM   Specimen: BLOOD RIGHT HAND  Result Value Ref Range Status   Specimen Description BLOOD RIGHT HAND  Final   Special Requests   Final    BOTTLES DRAWN AEROBIC AND ANAEROBIC Blood Culture adequate volume   Culture  Setup Time   Final    GRAM POSITIVE RODS AEROBIC BOTTLE ONLY CRITICAL RESULT CALLED TO, READ BACK BY AND VERIFIED WITH: T RUDISILL,[email or web address]  08/09/22 MK Performed at [facility], [address] [place], [place] [address]    Culture GRAM POSITIVE RODS  Final   Report Status PENDING  Incomplete  Urine Culture     Status: Abnormal   Collection Time: 08/06/22 11:47 PM   Specimen: Urine, Clean Catch  Result Value Ref Range Status   Specimen Description URINE, CLEAN CATCH  Final   Special Requests   Final    NONE Performed at [facility], [address] [place], [place] [address]    Culture MULTIPLE SPECIES PRESENT, SUGGEST RECOLLECTION (A)  Final   Report  Status 08/08/2022 FINAL  Final  Aerobic/Anaerobic Culture w Gram Stain (surgical/deep wound)     Status: None (Preliminary result)   Collection Time: 08/09/22 12:10 PM   Specimen: Other Source; Body Fluid  Result Value Ref Range Status   Specimen Description WOUND  Final   Special Requests  GLUTEAL  Final   Gram Stain   Final    RARE WBC PRESENT,BOTH PMN AND MONONUCLEAR RARE GRAM NEGATIVE RODS    Culture   Final    FEW GRAM NEGATIVE RODS IDENTIFICATION AND SUSCEPTIBILITIES TO FOLLOW Performed at [facility], [address] [place], [place] [address]    Report Status PENDING  Incomplete     Medications:    aspirin  81 mg Oral Daily   calcium citrate  1 tablet Oral BID   citalopram  40 mg Oral Daily   cloNIDine  0.2 mg Oral BID   heparin  5,000 Units Subcutaneous Q8H   insulin aspart  0-9 Units Subcutaneous TID WC   lisinopril  20 mg Oral Daily   potassium chloride  40 mEq Oral Once   Continuous Infusions:  piperacillin-tazobactam (ZOSYN)  IV 3.375 g (08/10/22 1735)   vancomycin 1,250 mg (08/09/22 2321)      LOS: 3 days   Charlynne Cousins  [facility]  08/10/2022, 8:25 PM

## 2022-08-10 NOTE — Progress Notes (Signed)
Physical Therapy Treatment Patient Details Name: Connie Wilson MRN: [id number] DOB: 08-26-1956 Today's Date: 08/10/2022   History of Present Illness Pt is a 66 y.o. female who presented 08/06/22 after being found down at home with AMS. Pt admitted with acute metabolic encephalopathy associated with sepsis. PMH: DM2, HTN, TIA, depression, anxiety, recurrent UTI, HLD.    PT Comments    Pt received in supine, agreeable to therapy session and with good participation and fair tolerance for bed mobility and seated activity. BP soft while pt seated EOB so defer standing, pt performs bed mobility and seated scooting along EOB with up to modA and with decreased carryover of cues for pressure relief/importance of increased mobility from previous session, she may benefit from HEP handout brought to room next session. Pt may benefit from air bed due to wounds and pt with difficulty relieving her own pressure in supine. Plan to trial standing in [place] next session if BP more stable. Pt continues to benefit from PT services to progress toward functional mobility goals.    Recommendations for follow up therapy are one component of a multi-disciplinary discharge planning process, led by the attending physician.  Recommendations may be updated based on patient status, additional functional criteria and insurance authorization.  Follow Up Recommendations  Acute inpatient rehab (3hours/day) (if not AIR then SNF)     Assistance Recommended at Discharge Frequent or constant Supervision/Assistance  Patient can return home with the following A lot of help with walking and/or transfers;A lot of help with bathing/dressing/bathroom;Assistance with cooking/housework;Direct supervision/assist for medications management;Direct supervision/assist for financial management;Assist for transportation;Help with stairs or ramp for entrance   Equipment Recommendations  Rolling walker (2 wheels)    Recommendations for Other  Services Rehab consult;OT consult     Precautions / Restrictions Precautions Precautions: Fall;Other (comment) Precaution Comments: sacral and back wounds Restrictions Weight Bearing Restrictions: No     Mobility  Bed Mobility Overal bed mobility: Needs Assistance Bed Mobility: Rolling, Sidelying to Sit, Sit to Supine Rolling: Min assist Sidelying to sit: Mod assist   Sit to supine: Mod assist   General bed mobility comments: pt needing mod step by step sequencing cues for improved technique; via log roll to L EOB    Transfers Overall transfer level: Needs assistance                 General transfer comment: defer standing as pt BP soft while seated EOB reading 81/62, pt denies dizziness; pt performed seated lateral scoots toward HOB with mod cues and minA bed pad assist        Balance Overall balance assessment: Needs assistance   Sitting balance-Leahy Scale: Poor Sitting balance - Comments: fair static sitting with support; poor dynamic sitting while unsupported       Standing balance comment: defer; soft BP                            Cognition Arousal/Alertness: Awake/alert Behavior During Therapy: Flat affect Overall Cognitive Status: Impaired/Different from baseline Area of Impairment: Orientation, Attention, Memory, Following commands, Safety/judgement, Awareness, Problem solving                 Orientation Level: Disoriented to, Place, Time, Situation Current Attention Level: Sustained Memory: Decreased short-term memory Following Commands: Follows one step commands inconsistently, Follows one step commands with increased time Safety/Judgement: Decreased awareness of safety, Decreased awareness of deficits Awareness: Intellectual Problem Solving: Slow processing, Decreased initiation, Difficulty sequencing,  Requires verbal cues, Requires tactile cues General Comments: Pt needed assist to dial # for calling in breakfast order for  AM, then was told she had already placed order. Pt participatory but seeming to have decreased awareness/problem solving, mod sequencing cues needed.        Exercises Other Exercises Other Exercises: supine BLE AROM: ankle pumps, heel slides, hip abduction, SLR x5-10 reps ea    General Comments General comments (skin integrity, edema, etc.): BP 81/62 HR 81 seated EOB; BP 94/56 (68) once returned to supine HR 74 bpm, nursing staff notified/entered into flowsheet; SpO2 99% on RA      Pertinent Vitals/Pain Pain Assessment Pain Assessment: Faces Faces Pain Scale: Hurts little more Pain Location: back and sacral wounds with repositioning Pain Descriptors / Indicators: Discomfort, Grimacing, Guarding Pain Intervention(s): Monitored during session, Repositioned     PT Goals (current goals can now be found in the care plan section) Acute Rehab PT Goals Patient Stated Goal: none stated PT Goal Formulation: With patient Time For Goal Achievement: 08/21/22 Progress towards PT goals: Progressing toward goals    Frequency    Min 3X/week      PT Plan Current plan remains appropriate       AM-PAC PT "6 Clicks" Mobility   Outcome Measure  Help needed turning from your back to your side while in a flat bed without using bedrails?: A Little Help needed moving from lying on your back to sitting on the side of a flat bed without using bedrails?: A Lot Help needed moving to and from a bed to a chair (including a wheelchair)?: A Lot Help needed standing up from a chair using your arms (e.g., wheelchair or bedside chair)?: A Lot Help needed to walk in hospital room?: Total Help needed climbing 3-5 steps with a railing? : Total 6 Click Score: 11    End of Session   Activity Tolerance: Treatment limited secondary to medical complications (Comment);Other (comment) (soft BP sitting EOB, still somewhat soft once back in supine so defer standing for pt safety) Patient left: in bed;with call  bell/phone within reach;with bed alarm set;with SCD's reapplied;Other (comment) (heels floated, pt encouraged to roll q2H will need reinforcement) Nurse Communication: Mobility status;Other (comment);Precautions (pt may need air mattress type bed due to wounds, difficulty relieving her own pressure/rolling on her own; soft BP) PT Visit Diagnosis: Unsteadiness on feet (R26.81);Other abnormalities of gait and mobility (R26.89);Muscle weakness (generalized) (M62.81);History of falling (Z91.81);Difficulty in walking, not elsewhere classified (R26.2);Pain Pain - part of body:  (bottom/back where wounds are)     Time: 7673-4193 PT Time Calculation (min) (ACUTE ONLY): 18 min  Charges:  $Therapeutic Activity: 8-22 mins                     Keisuke Hollabaugh P., PTA Acute Rehabilitation Services Secure Chat Preferred 9a-5:30pm Office: [phone]    Dorathy Kinsman [facility] 08/10/2022, 6:17 PM

## 2022-08-10 NOTE — Progress Notes (Signed)
   08/10/22 1821  Vitals  BP (!) 95/57  MEWS COLOR  MEWS Score Color Green  MEWS Score  MEWS Temp 0  MEWS Systolic 1  MEWS Pulse 0  MEWS RR 0  MEWS LOC 0  MEWS Score 1   Feliz, MD paged regarding BP. No new orders at this time.

## 2022-08-10 NOTE — Evaluation (Signed)
Occupational Therapy Evaluation Patient Details Name: Connie Wilson MRN: [id number] DOB: 1956/03/09 Today's Date: 08/10/2022   History of Present Illness Pt is a 66 y.o. female who presented 08/06/22 after being found down at home with AMS. Pt admitted with acute metabolic encephalopathy associated with sepsis. PMH: DM2, HTN, TIA, depression, anxiety, recurrent UTI, HLD   Clinical Impression   Pt was living independently prior to admission. She reports she likes to sleep and watch tv. Pt presents with decreased activity tolerance, repeatedly asking to return to supine Wilson she could take a nap, generalized weakness and impaired sitting and standing balance with posterior bias. Pt with impaired attention, ability to follow commands and disorientation to time, place and situation. Pt is able to self feed with set up and groom with set up to mod assist. She requires min to total assist for ADLs. Pt stood from elevated bed, but abruptly returned to sitting and then attempting to return to supine citing fatigue. Recommending SNF for rehab.      Recommendations for follow up therapy are one component of a multi-disciplinary discharge planning process, led by the attending physician.  Recommendations may be updated based on patient status, additional functional criteria and insurance authorization.   Follow Up Recommendations  Skilled nursing-short term rehab (<3 hours/day)    Assistance Recommended at Discharge Frequent or constant Supervision/Assistance  Patient can return home with the following Two people to help with walking and/or transfers;A lot of help with bathing/dressing/bathroom;Assistance with cooking/housework;Direct supervision/assist for medications management;Direct supervision/assist for financial management;Assist for transportation;Help with stairs or ramp for entrance    Functional Status Assessment  Patient has had a recent decline in their functional status and demonstrates the  ability to make significant improvements in function in a reasonable and predictable amount of time.  Equipment Recommendations  Other (comment) (defer to next venue)    Recommendations for Other Services       Precautions / Restrictions Precautions Precautions: Fall;Other (comment) Precaution Comments: sacral and back wounds Restrictions Weight Bearing Restrictions: No      Mobility Bed Mobility Overal bed mobility: Needs Assistance Bed Mobility: Rolling, Sidelying to Sit, Sit to Supine Rolling: Mod assist Sidelying to sit: Max assist   Sit to supine: Mod assist   General bed mobility comments: pt with difficulty following commands with verbal cues for bed mobility, assist to roll, for LEs over EOB and to raise trunk, assisted LEs back into bed    Transfers Overall transfer level: Needs assistance Equipment used: Rolling walker (2 wheels) Transfers: Sit to/from Stand Sit to Stand: From elevated surface, Max assist           General transfer comment: pt sitting abruptly once stood      Balance Overall balance assessment: Needs assistance   Sitting balance-Leahy Scale: Poor Sitting balance - Comments: stabilized with R hand while brushing teeth and hair with L Postural control: Posterior lean   Standing balance-Leahy Scale: Poor Standing balance comment: Reliant on UE support and external physical assistance, noted posterior lean                           ADL either performed or assessed with clinical judgement   ADL Overall ADL's : Needs assistance/impaired Eating/Feeding: Independent;Bed level   Grooming: Oral care;Set up;Sitting;Brushing hair;Moderate assistance Grooming Details (indicate cue type and reason): EOB Upper Body Bathing: Moderate assistance;Sitting   Lower Body Bathing: Total assistance;Bed level;Sitting/lateral leans   Upper Body Dressing :  Minimal assistance;Sitting   Lower Body Dressing: Total assistance;Bed  level;Sitting/lateral leans       Toileting- Clothing Manipulation and Hygiene: Total assistance;Bed level               Vision Baseline Vision/History: 1 Wears glasses Ability to See in Adequate Light: 0 Adequate Patient Visual Report: No change from baseline       Perception     Praxis      Pertinent Vitals/Pain Pain Assessment Pain Assessment: Faces Faces Pain Scale: Hurts little more Pain Location: back and sacral wounds Pain Descriptors / Indicators: Discomfort, Grimacing, Guarding Pain Intervention(s): Monitored during session, Repositioned     Hand Dominance  (mixed dominance)   Extremity/Trunk Assessment Upper Extremity Assessment Upper Extremity Assessment: Generalized weakness   Lower Extremity Assessment Lower Extremity Assessment: Defer to PT evaluation   Cervical / Trunk Assessment Cervical / Trunk Assessment: Other exceptions Cervical / Trunk Exceptions: wounds   Communication Communication Communication: No difficulties   Cognition Arousal/Alertness: Awake/alert Behavior During Therapy: Flat affect Overall Cognitive Status: Impaired/Different from baseline Area of Impairment: Orientation, Attention, Memory, Following commands, Safety/judgement, Awareness, Problem solving                 Orientation Level: Disoriented to, Place, Time, Situation Current Attention Level: Sustained Memory: Decreased short-term memory Following Commands: Follows one step commands inconsistently, Follows one step commands with increased time Safety/Judgement: Decreased awareness of safety, Decreased awareness of deficits Awareness: Intellectual Problem Solving: Slow processing, Decreased initiation, Difficulty sequencing, Requires verbal cues, Requires tactile cues General Comments: pt repeatedly stating she wanted to take a nap, asking when she was going to have therapy with therapist bedside     General Comments       Exercises     Shoulder  Instructions      Home Living Family/patient expects to be discharged to:: Private residence Living Arrangements: Alone   Type of Home: House Home Access: Stairs to enter [employer] of Steps: 1 Entrance Stairs-Rails: Right Home Layout: One level     Bathroom Shower/Tub: Teacher, early years/pre: Standard     Home Equipment: Cane - quad;BSC/3in1;Tub bench          Prior Functioning/Environment Prior Level of Function : Independent/Modified Independent             Mobility Comments: Mod I with quad cane          OT Problem List: Decreased strength;Decreased activity tolerance;Impaired balance (sitting and/or standing);Decreased cognition;Decreased safety awareness;Decreased knowledge of use of DME or AE;Pain      OT Treatment/Interventions: Self-care/ADL training;DME and/or AE instruction;Therapeutic activities;Patient/family education;Cognitive remediation/compensation    OT Goals(Current goals can be found in the care plan section) Acute Rehab OT Goals OT Goal Formulation: With patient Time For Goal Achievement: 08/24/22 Potential to Achieve Goals: Fair ADL Goals Pt Will Perform Upper Body Bathing: with min assist;sitting Pt Will Perform Upper Body Dressing: with supervision;sitting Pt Will Transfer to Toilet: ambulating;bedside commode;with mod assist Additional ADL Goal #1: Pt will perform bed mobility with minimum assistance as a precursor to ADLs and pressure relief. Additional ADL Goal #2: Pt will sit unsupported while engaged in ADLs x 10 minutes. Additional ADL Goal #3: Pt will follow one step commands with 50% accuracy. Additional ADL Goal #4: Pt will participate in 30 minutes of therapeutic activities to build endurance.  OT Frequency: Min 2X/week    Co-evaluation  AM-PAC OT "6 Clicks" Daily Activity     Outcome Measure Help from another person eating meals?: None Help from another person taking care of  personal grooming?: A Little Help from another person toileting, which includes using toliet, bedpan, or urinal?: Total Help from another person bathing (including washing, rinsing, drying)?: A Lot Help from another person to put on and taking off regular upper body clothing?: A Little Help from another person to put on and taking off regular lower body clothing?: Total 6 Click Score: 14   End of Session Equipment Utilized During Treatment: Gait belt;Rolling walker (2 wheels)  Activity Tolerance: Patient limited by fatigue Patient left: in bed;with call bell/phone within reach;with bed alarm set  OT Visit Diagnosis: Unsteadiness on feet (R26.81);Pain;Muscle weakness (generalized) (M62.81);Cognitive communication deficit (R41.841)                Time: [id number] OT Time Calculation (min): 20 min Charges:  OT General Charges $OT Visit: 1 Visit OT Evaluation $OT Eval Moderate Complexity: [place], OTR/L Acute Rehabilitation Services Office: [phone]  Connie Wilson 08/10/2022, 11:09 AM

## 2022-08-11 DIAGNOSIS — L8994 Pressure ulcer of unspecified site, stage 4: Secondary | ICD-10-CM

## 2022-08-11 DIAGNOSIS — R6510 Systemic inflammatory response syndrome (SIRS) of non-infectious origin without acute organ dysfunction: Secondary | ICD-10-CM | POA: Diagnosis not present

## 2022-08-11 DIAGNOSIS — L089 Local infection of the skin and subcutaneous tissue, unspecified: Secondary | ICD-10-CM

## 2022-08-11 DIAGNOSIS — L03312 Cellulitis of back [any part except buttock]: Secondary | ICD-10-CM | POA: Diagnosis not present

## 2022-08-11 DIAGNOSIS — G9341 Metabolic encephalopathy: Secondary | ICD-10-CM | POA: Diagnosis not present

## 2022-08-11 DIAGNOSIS — L98499 Non-pressure chronic ulcer of skin of other sites with unspecified severity: Secondary | ICD-10-CM | POA: Diagnosis not present

## 2022-08-11 LAB — MAGNESIUM: Magnesium: 1.7 mg/dL (ref 1.7–2.4)

## 2022-08-11 LAB — CULTURE, BLOOD (ROUTINE X 2): Culture: NO GROWTH

## 2022-08-11 LAB — BASIC METABOLIC PANEL
Anion gap: 7 (ref 5–15)
BUN: 12 mg/dL (ref 8–23)
CO2: 25 mmol/L (ref 22–32)
Calcium: 8 mg/dL — ABNORMAL LOW (ref 8.9–10.3)
Chloride: 106 mmol/L (ref 98–111)
Creatinine, Ser: 0.76 mg/dL (ref 0.44–1.00)
GFR, Estimated: >60 mL/min (ref >60)
Glucose, Bld: 116 mg/dL — ABNORMAL HIGH (ref 70–99)
Potassium: 3.6 mmol/L (ref 3.5–5.1)
Sodium: 138 mmol/L (ref 135–145)

## 2022-08-11 LAB — GLUCOSE, CAPILLARY
Glucose-Capillary: 107 mg/dL — ABNORMAL HIGH (ref 70–99)
Glucose-Capillary: 109 mg/dL — ABNORMAL HIGH (ref 70–99)
Glucose-Capillary: 118 mg/dL — ABNORMAL HIGH (ref 70–99)
Glucose-Capillary: 122 mg/dL — ABNORMAL HIGH (ref 70–99)
Glucose-Capillary: 73 mg/dL (ref 70–99)

## 2022-08-11 MED ORDER — MEDIHONEY WOUND/BURN DRESSING EX PSTE
1.0000 | PASTE | Freq: Two times a day (BID) | CUTANEOUS | Status: DC
  Administered 2022-08-11 – 2022-08-17 (×11): 1 via TOPICAL
  Filled 2022-08-11 (×4): qty 44

## 2022-08-11 NOTE — Progress Notes (Signed)
Physical Therapy Treatment Patient Details Name: Connie Wilson MRN: [id number] DOB: June 27, 1956 Today's Date: 08/11/2022   History of Present Illness Pt is a 66 y.o. female who presented 08/06/22 after being found down at home with AMS. Pt admitted with acute metabolic encephalopathy associated with sepsis. PMH: DM2, HTN, TIA, depression, anxiety, recurrent UTI, HLD.    PT Comments    Pt received in supine, awake but c/o drowsiness, pt following simple commands well with encouragement and agreeable to therapy session with emphasis on transfer training. Pt seated BP more stable this date and initially upon standing however BP drop after ~3 mins and pt c/o increased fatigue, persistent lightheadedness. She reports visual deficits at baseline and "double vision" due to cataracts, need to clarify with family her baseline deficits as per chart review she was working prior. Pt able to perform stand pivot transfers x2 with RW and up to +2 modA for safety, and able to stand up to 5 mins at a time during transfers and for hygiene assist. Air bed brought to room during PT session and pt sitting EOB on air bed with NT present at end of session. Pt continues to benefit from PT services to progress toward functional mobility goals.    Recommendations for follow up therapy are one component of a multi-disciplinary discharge planning process, led by the attending physician.  Recommendations may be updated based on patient status, additional functional criteria and insurance authorization.  Follow Up Recommendations  Skilled nursing-short term rehab (<3 hours/day) (clarified per AIR not available to her) Can patient physically be transported by private vehicle: No   Assistance Recommended at Discharge Frequent or constant Supervision/Assistance  Patient can return home with the following A lot of help with walking and/or transfers;A lot of help with bathing/dressing/bathroom;Assistance with  cooking/housework;Direct supervision/assist for medications management;Direct supervision/assist for financial management;Assist for transportation;Help with stairs or ramp for entrance   Equipment Recommendations  Rolling walker (2 wheels)       Precautions / Restrictions Precautions Precautions: Fall;Other (comment) Precaution Comments: sacral and back wounds Restrictions Weight Bearing Restrictions: No     Mobility  Bed Mobility Overal bed mobility: Needs Assistance Bed Mobility: Rolling, Sidelying to Sit Rolling: Min assist Sidelying to sit: Mod assist       General bed mobility comments: pt needing mod step by step sequencing cues for improved technique; via log roll to R EOB. Pt needs bed pad assist to advance hips to scoot to foot flat.    Transfers Overall transfer level: Needs assistance Equipment used: Rolling walker (2 wheels) Transfers: Sit to/from Stand Sit to Stand: Mod assist, +2 safety/equipment           General transfer comment: BP stable seated, pt reports mild dizziness but states "it's normal for me, I have cataracts" EOB<>RW and recliner<>RW consistent modA +1-2 needed each time. step pivot bed>chair then chair>new air bed after ~5 min seated rest break    Ambulation/Gait Ambulation/Gait assistance: Min assist, +2 physical assistance Gait Distance (Feet): 4 Feet (x2 trials with ~5 min seated break) Assistive device: Rolling walker (2 wheels) Gait Pattern/deviations: Step-to pattern, Decreased step length - right, Decreased step length - left, Leaning posteriorly       General Gait Details: Pt with slow, small lateral and pivotal steps, minA for safety and mod cues for step sequencing and RW use; no overt buckling but pt fatigues after standing 3-5 mins, also she reports dizziness and found to be orthostatic at 3 mins (not initially).  Balance Overall balance assessment: Needs assistance Sitting-balance support: Bilateral upper extremity  supported, Feet supported Sitting balance-Leahy Scale: Fair Sitting balance - Comments: fair static sitting with support; poor dynamic sitting while unsupported   Standing balance support: Bilateral upper extremity supported, During functional activity Standing balance-Leahy Scale: Fair Standing balance comment: fair with RW support, able to stand with single UE support and min guard for safety ~3-4 minutes without buckling x2 trials                            Cognition Arousal/Alertness: Awake/alert (pretending to sleep but awake and following commands) Behavior During Therapy: Flat affect Overall Cognitive Status: Impaired/Different from baseline Area of Impairment: Orientation, Attention, Memory, Following commands, Safety/judgement, Awareness, Problem solving                 Orientation Level: Disoriented to, Place, Time, Situation Current Attention Level: Sustained Memory: Decreased short-term memory Following Commands: Follows one step commands inconsistently, Follows one step commands with increased time Safety/Judgement: Decreased awareness of safety, Decreased awareness of deficits Awareness: Intellectual Problem Solving: Slow processing, Requires verbal cues, Requires tactile cues General Comments: Pt had head covered with blanket when PTA arrived to room but awake and frequently re-covered head with blanket despite instruction on plan for session. Pt participatory and following simple mobility commands but seeming to have decreased awareness/problem solving, mod sequencing cues needed.        Exercises Other Exercises Other Exercises: supine BLE AROM: ankle pumps, heel slides, hip abduction, hip ADduction, SLR x5-10 reps ea Other Exercises: seated BLE AROM: LAQ x 10 reps ea    General Comments General comments (skin integrity, edema, etc.): BP 138/73 (90) HR 79 resting; BP 123/75 (91) standing at 1 min; BP 101/80 (87) standing at 3 mins HR 108 bpm, pt  reports dizziness throughout.      Pertinent Vitals/Pain Pain Assessment Pain Assessment: Faces Faces Pain Scale: Hurts a little bit Pain Location: back and sacral wounds with repositioning Pain Descriptors / Indicators: Discomfort, Grimacing, Guarding Pain Intervention(s): Monitored during session, Repositioned, Limited activity within patient's tolerance           PT Goals (current goals can now be found in the care plan section) Acute Rehab PT Goals Patient Stated Goal: none stated PT Goal Formulation: With patient Time For Goal Achievement: 08/21/22 Progress towards PT goals: Progressing toward goals    Frequency    Min 3X/week      PT Plan Current plan remains appropriate       AM-PAC PT "6 Clicks" Mobility   Outcome Measure  Help needed turning from your back to your side while in a flat bed without using bedrails?: A Little Help needed moving from lying on your back to sitting on the side of a flat bed without using bedrails?: A Lot Help needed moving to and from a bed to a chair (including a wheelchair)?: A Lot Help needed standing up from a chair using your arms (e.g., wheelchair or bedside chair)?: A Lot Help needed to walk in hospital room?: Total Help needed climbing 3-5 steps with a railing? : Total 6 Click Score: 11    End of Session Equipment Utilized During Treatment: Gait belt Activity Tolerance: Patient tolerated treatment well;Other (comment) (orthostatic with standing ~3 mins) Patient left: in bed;with call bell/phone within reach;Other (comment);with nursing/sitter in room (pt sitting EOB on air bed, NT and RN in room giving her a bath and  to assess dressings) Nurse Communication: Mobility status;Other (comment);Precautions (orthostatic after 3 mins standing; wound dressing quite saturated -not sure when needing to be changed; more skin breakdown on middle of her back) PT Visit Diagnosis: Unsteadiness on feet (R26.81);Other abnormalities of gait  and mobility (R26.89);Muscle weakness (generalized) (M62.81);History of falling (Z91.81);Difficulty in walking, not elsewhere classified (R26.2);Pain Pain - part of body:  (bottom/back where wounds are)     Time: 2841-3244 PT Time Calculation (min) (ACUTE ONLY): 30 min  Charges:  $Therapeutic Exercise: 8-22 mins $Therapeutic Activity: 8-22 mins                     Andora Krull P., PTA Acute Rehabilitation Services Secure Chat Preferred 9a-5:30pm Office: [place] 08/11/2022, 1:51 PM

## 2022-08-11 NOTE — Progress Notes (Signed)
[facility] PROGRESS NOTE    Progress Note  Connie Wilson  KMQ:[id number] DOB: [birth year] DOA: 08/06/2022 PCP: Paschal Dopp, PA     Brief Narrative:   Connie Wilson is an 66 y.o. female past medical history of diabetes mellitus type 2, essential hypertension TIA recurrent UTIs who presents to the ED after being found down at home she lives alone last time she was seen was about a week ago upon EMS arrival they found her confused soiled in feces and urine.   Assessment/Plan:   SIRS: Source is not entirely clear, question sacral decubitus ulcer versus UTI which is less likely. 1 out of 4 blood cultures grew gram-positive rods likely contaminant. White blood cell count this morning is 11 she has remained afebrile. Continue IV vancomycin and Zosyn.  Acute metabolic encephalopathy: Likely due to #1, was found on the floor for unclear amount of time thankfully her mental status seems to be improved with supportive treatment. No clear-cut of inciting incident.  History of CVA: Incidentally  on MRI, neurology was curb sided recommended no further management. Continue aspirin and statins. Physical therapy evaluation was done and she will need inpatient rehab.  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  Per daughter she was independent and  driving  and was at works as a Lawyer until a couple of weeks ago. General surgery was consulted she was taken to the OR on 10/09/2022 for debridement. Wound cultures are pending. Surgery recommended twice daily wet-to-dry dressing no further debridement.   She will probably benefit from chemical debridement (Santyl) in the future.  Controlled diabetes mellitus type 2: Last A1c of 6.6 to, relatively well-controlled sliding scale insulin continue current regimen.  Essential hypertension: Her pressure is low post op Continue lisinopril and clonidine. KVO IV  fluids..  Depression: Continue Celexa.  Hypokalemia: Repleted now resolved.  Hypomagnesemia: Check a mag in am.  DVT prophylaxis: Lovenox Family Communication:None Status is: Inpatient Remains inpatient appropriate because: SIRS with stage IV sacral decub    Code Status:     Code Status Orders  (From admission, onward)           Start     Ordered   08/07/22 0134  Full code  Continuous        08/07/22 0136           Code Status History     Date Active Date Inactive Code Status Order ID Comments User Context   04/06/2013 1828 04/06/2013 2256 Full Code [id number]  Fayrene Helper, PA-C ED         IV Access:   Peripheral IV   Procedures and diagnostic studies:   No results found.   Medical Consultants:   None.   Subjective:    [name] relates she is better today than yesterday.  Objective:    Vitals:   08/10/22 1754 08/10/22 1821 08/11/22 0035 08/11/22 0722  BP: (!) 94/56 (!) 95/57  127/71  Pulse: 74  73 79  Resp:   18   Temp:   98.2 F (36.8 C) 98.1 F (36.7 C)  TempSrc:   Oral Oral  SpO2:   100% 98%  Weight:       SpO2: 98 %   Intake/Output Summary (Last 24 hours) at 08/11/2022 0730 Last data filed at 08/11/2022 0254 Gross per 24 hour  Intake 300 ml  Output 1600 ml  Net -1300 ml    [employer]  08/08/22 1301  Weight: 91.5 kg    Exam: General exam: In no acute distress. Respiratory system: Good air movement and clear to auscultation. Cardiovascular system: S1 & S2 heard, RRR. No JVD. Gastrointestinal system: Abdomen is nondistended, soft and nontender.  Extremities: No pedal edema. Skin: No rashes, lesions or ulcers Psychiatry: Judgement and insight appear normal. Mood & affect appropriate.   Data Reviewed:    Labs: Basic Metabolic Panel: Recent Labs  Lab 08/07/22 0522 08/08/22 0433 08/09/22 0349 08/10/22 0453 08/11/22 0347  NA 146* 139 137 137 138  K 3.8 3.0* 3.2* 3.4* 3.6  CL 113* 110 107 103 106   CO2 16* 21* 24 20* 25  GLUCOSE 143* 95 199* 156* 116*  BUN 41* 22 13 11 12   CREATININE 0.88 0.84 0.83 0.69 0.76  CALCIUM 9.0 8.4* 8.1* 8.5* 8.0*  MG  --  1.6* 1.9 1.8 1.7    GFR Estimated Creatinine Clearance: 81.8 mL/min (by C-G formula based on SCr of 0.76 mg/dL). Liver Function Tests: Recent Labs  Lab 08/06/22 2058 08/07/22 0522 08/08/22 0433 08/09/22 0349 08/10/22 0453  AST 36 34 32 31 23  ALT 30 25 29 30 25   ALKPHOS 103 80 74 72 66  BILITOT 1.2 1.1 0.8 0.7 0.3  PROT 7.9 6.2* 5.4* 5.2* 5.3*  ALBUMIN 3.0* 2.3* 2.1* 1.8* 1.9*    No results for input(s): "LIPASE", "AMYLASE" in the last 168 hours. Recent Labs  Lab 08/06/22 2058  AMMONIA 16    Coagulation profile No results for input(s): "INR", "PROTIME" in the last 168 hours. COVID-19 Labs  No results for input(s): "DDIMER", "FERRITIN", "LDH", "CRP" in the last 72 hours.  Lab Results  Component Value Date   SARSCOV2NAA NEGATIVE 08/06/2022    CBC: Recent Labs  Lab 08/06/22 2058 08/07/22 0213 08/07/22 0522 08/08/22 0433 08/09/22 0349 08/10/22 0453 08/10/22 2100  WBC 27.8*   < > 27.7* 15.2* 10.8* 12.9* 11.1*  NEUTROABS 24.2*  --   --   --   --   --  7.6  HGB 16.9*   < > 14.8 11.9* 11.4* 10.9* 10.8*  HCT 50.6*   < > 46.4* 35.5* 33.5* 32.2* 31.6*  MCV 84.6   < > 90.3 84.5 84.6 83.6 84.0  PLT 301   < > 249 202 182 194 194   < > = values in this interval not displayed.    Cardiac Enzymes: Recent Labs  Lab 08/06/22 2058  CKTOTAL 417*    BNP (last 3 results) No results for input(s): "PROBNP" in the last 8760 hours. CBG: Recent Labs  Lab 08/10/22 1213 08/10/22 1632 08/10/22 1749 08/11/22 0028 08/11/22 0725  GLUCAP 192* 140* 206* 109* 107*    D-Dimer: No results for input(s): "DDIMER" in the last 72 hours. Hgb A1c: No results for input(s): "HGBA1C" in the last 72 hours.  Lipid Profile: Recent Labs    08/10/22 0453  CHOL 149  HDL 28*  LDLCALC 87  TRIG 13/02/23*  CHOLHDL 5.3     Thyroid function studies: No results for input(s): "TSH", "T4TOTAL", "T3FREE", "THYROIDAB" in the last 72 hours.  Invalid input(s): "FREET3" Anemia work up: No results for input(s): "VITAMINB12", "FOLATE", "FERRITIN", "TIBC", "IRON", "RETICCTPCT" in the last 72 hours. Sepsis Labs: Recent Labs  Lab 08/06/22 2255 08/07/22 0213 08/07/22 0522 08/08/22 0433 08/09/22 0349 08/10/22 0453 08/10/22 2100  WBC  --  28.0*   < > 15.2* 10.8* 12.9* 11.1*  LATICACIDVEN 2.0* 2.0*  --   --   --   --   --    < > =  values in this interval not displayed.    Microbiology Recent Results (from the past 240 hour(s))  SARS Coronavirus 2 by RT PCR (hospital order, performed in [facility] hospital lab) *cepheid single result test* Anterior Nasal Swab     Status: None   Collection Time: 08/06/22  8:58 PM   Specimen: Anterior Nasal Swab  Result Value Ref Range Status   SARS Coronavirus 2 by RT PCR NEGATIVE NEGATIVE Final    Comment: (NOTE) SARS-CoV-2 target nucleic acids are NOT DETECTED.  The SARS-CoV-2 RNA is generally detectable in upper and lower respiratory specimens during the acute phase of infection. The lowest concentration of SARS-CoV-2 viral copies this assay can detect is 250 copies / mL. A negative result does not preclude SARS-CoV-2 infection and should not be used as the sole basis for treatment or other patient management decisions.  A negative result may occur with improper specimen collection / handling, submission of specimen other than nasopharyngeal swab, presence of viral mutation(s) within the areas targeted by this assay, and inadequate number of viral copies (<250 copies / mL). A negative result must be combined with clinical observations, patient history, and epidemiological information.  Fact Sheet for Patients:   RoadLapTop.co.za  Fact Sheet for Healthcare Providers: http://kim-miller.com/  This test is not yet approved  or  cleared by the [place] FDA and has been authorized for detection and/or diagnosis of SARS-CoV-2 by FDA under an Emergency Use Authorization (EUA).  This EUA will remain in effect (meaning this test can be used) for the duration of the COVID-19 declaration under Section 564(b)(1) of the Act, 21 U.S.C. section 360bbb-3(b)(1), unless the authorization is terminated or revoked sooner.  Performed at [facility], [address], [place], [place] [address]   Blood culture (routine x 2)     Status: None   Collection Time: 08/06/22  9:55 PM   Specimen: BLOOD  Result Value Ref Range Status   Specimen Description BLOOD LEFT ANTECUBITAL  Final   Special Requests   Final    BOTTLES DRAWN AEROBIC AND ANAEROBIC Blood Culture results may not be optimal due to an inadequate volume of blood received in culture bottles   Culture   Final    NO GROWTH 5 DAYS Performed at [facility], [address] [place], [place] [address]    Report Status 08/11/2022 FINAL  Final  Blood culture (routine x 2)     Status: None (Preliminary result)   Collection Time: 08/06/22 10:01 PM   Specimen: BLOOD RIGHT HAND  Result Value Ref Range Status   Specimen Description BLOOD RIGHT HAND  Final   Special Requests   Final    BOTTLES DRAWN AEROBIC AND ANAEROBIC Blood Culture adequate volume   Culture  Setup Time   Final    GRAM POSITIVE RODS AEROBIC BOTTLE ONLY CRITICAL RESULT CALLED TO, READ BACK BY AND VERIFIED WITH: T [email or web address]  08/09/22 MK Performed at [facility], [address], [place], [place] [address]    Culture GRAM POSITIVE RODS  Final   Report Status PENDING  Incomplete  Urine Culture     Status: Abnormal   Collection Time: 08/06/22 11:47 PM   Specimen: Urine, Clean Catch  Result Value Ref Range Status   Specimen Description URINE, CLEAN CATCH  Final   Special Requests   Final    NONE Performed at [facility] Lab, [address] [place], [place] [address]     Culture MULTIPLE SPECIES  PRESENT, SUGGEST RECOLLECTION (A)  Final   Report Status 08/08/2022 FINAL  Final  Aerobic/Anaerobic Culture w Gram Stain (surgical/deep wound)     Status: None (Preliminary result)   Collection Time: 08/09/22 12:10 PM   Specimen: Other Source; Body Fluid  Result Value Ref Range Status   Specimen Description WOUND  Final   Special Requests  GLUTEAL  Final   Gram Stain   Final    RARE WBC PRESENT,BOTH PMN AND MONONUCLEAR RARE GRAM NEGATIVE RODS    Culture   Final    FEW GRAM NEGATIVE RODS IDENTIFICATION AND SUSCEPTIBILITIES TO FOLLOW Performed at [facility], [place] [address] [place], [place] [address]    Report Status PENDING  Incomplete     Medications:    aspirin  81 mg Oral Daily   calcium citrate  1 tablet Oral BID   citalopram  40 mg Oral Daily   heparin  5,000 Units Subcutaneous Q8H   insulin aspart  0-9 Units Subcutaneous TID WC   potassium chloride  40 mEq Oral BID   Continuous Infusions:  sodium chloride 100 mL/hr at 08/11/22 0022   piperacillin-tazobactam (ZOSYN)  IV 3.375 g (08/11/22 0254)   vancomycin 1,250 mg (08/11/22 0031)      LOS: 4 days   Charlynne Cousins  [facility]  08/11/2022, 7:30 AM

## 2022-08-11 NOTE — Progress Notes (Addendum)
Inpatient Rehabilitation Admissions Coordinator   As per previous Admission Coordinator note on 10/29, [facility] unlikely to approve for this diagnosis. Other rehab venues need to be pursued. We are nor pursuing CIR.  Danne Baxter, RN, MSN Rehab Admissions Coordinator [phone] 08/11/2022 8:15 AM

## 2022-08-11 NOTE — TOC Initial Note (Signed)
Transition of Care [facility]) - Initial/Assessment Note    Patient Details  Name: Connie Wilson MRN: [id number] Date of Birth: 05-Jul-1956  Transition of [facility]) CM/SW Contact:    Joanne Chars, LCSW Phone Number: 08/11/2022, 11:41 AM  Clinical Narrative:   Pt orientation listed as fluctuating--CSW attempted to speak with pt, not able to participate, when asked where she was,states she is "at the gas station helping the woman" .    CSW spoke with daughter April, discussed DC recommendation for SNF.  April is in agreement, states pt lives alone, has been working as CNA up until several weeks ago.  Choice document placed in pt room, April will be at the hospital later today.  April states that pt has been down/depressed and April "cannot rule out" that this was a suicide attempt."  (MD notified)  Pt is vaccinated for covid but not boosted.                Expected Discharge Plan: Skilled Nursing Facility Barriers to Discharge: Continued Medical Work up, SNF Pending bed offer   Patient Goals and CMS Choice   CMS Medicare.gov Compare Post Acute Care list provided to:: Patient Represenative (must comment) (daughter April) Choice offered to / list presented to : Adult Children  Expected Discharge Plan and Services Expected Discharge Plan: [place] In-house Referral: Clinical Social Work   Post Acute Care Choice: [place] Living arrangements for the past 2 months: [place]                                      Prior Living Arrangements/Services Living arrangements for the past 2 months: Single Family Home Lives with:: Self Patient language and need for interpreter reviewed:: Yes        Need for Family Participation in Patient Care: Yes (Comment) Care giver support system in place?: Yes (comment) Current home services: Other (comment) (none) Criminal Activity/Legal Involvement Pertinent to Current Situation/Hospitalization: No -  Comment as needed  Activities of Daily Living      Permission Sought/Granted                  Emotional Assessment Appearance:: Appears stated age Attitude/Demeanor/Rapport: Unable to Assess Affect (typically observed): Unable to Assess Orientation: : Oriented to Self      Admission diagnosis:  Cellulitis of back except buttock [L03.312] Sepsis ([place]) [Y17.4] Acute metabolic encephalopathy [B44.96] Skin ulcer, unspecified ulcer stage ([place]) [L98.499] Sepsis, due to unspecified organism, unspecified whether acute organ dysfunction present [facility]) [A41.9] Patient Active Problem List   Diagnosis Date Noted   Pressure injury of skin 75/91/6384   Acute metabolic encephalopathy 66/59/9357   Sepsis ([place]) 08/07/2022   PVC's (premature ventricular contractions) 11/15/2021   SOB (shortness of breath) 11/15/2021   Arthritis of right knee 07/08/2021   Episode of recurrent major depressive disorder ([place]) 07/08/2021   Sinus pressure 05/22/2019   Low back pain 01/04/2018   Insomnia 10/19/2016   Dizziness 10/19/2016   Recurrent Clostridium difficile diarrhea 11/12/2015   Elevated glucose 09/29/2012   HTN (hypertension) 01/06/2012   Hyperlipidemia 01/06/2012   PCP:  Milford Cage, PA Pharmacy:   [place], [place] - [address] [place] [address] Phone: [phone] Fax: [phone]     Social Determinants of Health (SDOH) Interventions    Readmission Risk Interventions     No  data to display           

## 2022-08-11 NOTE — Progress Notes (Signed)
RE:  Connie Wilson       Date of Birth: [birth year]      Date:   08/11/22       To Whom It May Concern:  Please be advised that the above-named patient will require a short-term nursing home stay - anticipated 30 days or less for rehabilitation and strengthening.  The plan is for return home.                 MD signature                Date

## 2022-08-11 NOTE — Progress Notes (Addendum)
Progress Note  2 Days Post-Op  Subjective: Pain well controlled at rest - has increased pain with movement and dressing changes. Tolerating diet  Objective: Vital signs in last 24 hours: Temp:  [97.7 F (36.5 C)-98.2 F (36.8 C)] 98.1 F (36.7 C) (11/02 0722) Pulse Rate:  [73-80] 79 (11/02 0722) Resp:  [16-18] 18 (11/02 0035) BP: (88-165)/(56-84) 127/71 (11/02 0722) SpO2:  [98 %-100 %] 98 % (11/02 0722) Last BM Date : 08/07/22  Intake/Output from previous day: 11/01 0701 - 11/02 0700 In: 300 [IV Piggyback:300] Out: 1600 [Urine:1600] Intake/Output this shift: No intake/output data recorded.  PE: General: pleasant, WD, female who is laying in bed in NAD Lungs: Respiratory effort nonlabored Abd: soft, NT, ND MSK: all 4 extremities are symmetrical with no cyanosis, clubbing, or edema. Skin: warm and dry. Superior gluteal wounds: Right side wound with dry but soft wound base with development of some fibrinous tissue. Caudal to open wounds there is maceration and epidermal sloughing to bilateral buttocks. See below Psych: A&Ox3 with an appropriate affect.         Lab Results:  Recent Labs    08/10/22 0453 08/10/22 2100  WBC 12.9* 11.1*  HGB 10.9* 10.8*  HCT 32.2* 31.6*  PLT 194 194    BMET Recent Labs    08/10/22 0453 08/11/22 0347  NA 137 138  K 3.4* 3.6  CL 103 106  CO2 20* 25  GLUCOSE 156* 116*  BUN 11 12  CREATININE 0.69 0.76  CALCIUM 8.5* 8.0*    PT/INR No results for input(s): "LABPROT", "INR" in the last 72 hours. CMP     Component Value Date/Time   NA 138 08/11/2022 0347   NA 139 02/06/2017 1010   K 3.6 08/11/2022 0347   CL 106 08/11/2022 0347   CO2 25 08/11/2022 0347   GLUCOSE 116 (H) 08/11/2022 0347   BUN 12 08/11/2022 0347   BUN 11 02/06/2017 1010   CREATININE 0.76 08/11/2022 0347   CREATININE 0.81 04/01/2016 0854   CALCIUM 8.0 (L) 08/11/2022 0347   PROT 5.3 (L) 08/10/2022 0453   ALBUMIN 1.9 (L) 08/10/2022 0453   AST 23  08/10/2022 0453   ALT 25 08/10/2022 0453   ALKPHOS 66 08/10/2022 0453   BILITOT 0.3 08/10/2022 0453   GFRNONAA >60 08/11/2022 0347   GFRAA 95 02/06/2017 1010   Lipase     Component Value Date/Time   LIPASE 91.0 (H) 01/08/2021 0938       Studies/Results: No results found.  Anti-infectives: Anti-infectives (From admission, onward)    Start     Dose/Rate Route Frequency Ordered Stop   08/09/22 1800  piperacillin-tazobactam (ZOSYN) IVPB 3.375 g        3.375 g 12.5 mL/hr over 240 Minutes Intravenous Every 8 hours 08/09/22 1733     08/07/22 2300  vancomycin (VANCOCIN) IVPB 1000 mg/200 mL premix  Status:  Discontinued        1,000 mg 200 mL/hr over 60 Minutes Intravenous Every 24 hours 08/06/22 2155 08/07/22 1159   08/07/22 2300  vancomycin (VANCOREADY) IVPB 1250 mg/250 mL        1,250 mg 166.7 mL/hr over 90 Minutes Intravenous Every 24 hours 08/07/22 1159     08/07/22 0600  piperacillin-tazobactam (ZOSYN) IVPB 3.375 g  Status:  Discontinued        3.375 g 12.5 mL/hr over 240 Minutes Intravenous Every 8 hours 08/06/22 2155 08/07/22 0137   08/07/22 0600  piperacillin-tazobactam (ZOSYN) IVPB 3.375 g  Status:  Discontinued        3.375 g 12.5 mL/hr over 240 Minutes Intravenous Every 8 hours 08/07/22 0142 08/09/22 1733   08/07/22 0145  piperacillin-tazobactam (ZOSYN) IVPB 3.375 g  Status:  Discontinued        3.375 g 100 mL/hr over 30 Minutes Intravenous Every 8 hours 08/07/22 0136 08/07/22 0141   08/06/22 2130  piperacillin-tazobactam (ZOSYN) IVPB 3.375 g        3.375 g 100 mL/hr over 30 Minutes Intravenous  Once 08/06/22 2116 08/06/22 2301   08/06/22 2130  vancomycin (VANCOREADY) IVPB 1500 mg/300 mL        1,500 mg 150 mL/hr over 120 Minutes Intravenous  Once 08/06/22 2116 08/07/22 0039        Assessment/Plan Gluteal wound POD2 s/p OR debridement 10/31 Dr. [place] Silos - wound culture with gram - rods. Continue to follow. Continue IV abx.  - continue bid wet to dry dressing  changes. No further surgical debridement indicated at this time. Right lateral wound with fibrinous tissue at base. Add medihoney  FEN: carb mod ID: [email or web address] VTE: hep subq  I reviewed hospitalist notes, last 24 h vitals and pain scores, last 48 h intake and output, last 24 h labs and trends, and last 24 h imaging results.    LOS: 4 days   [facility] 08/11/2022, 8:58 AM Please see Amion for pager number during day hours 7:00am-4:30pm

## 2022-08-11 NOTE — Care Management Important Message (Signed)
Important Message  Patient Details  Name: Connie Wilson MRN: [id number] Date of Birth: 08-Sep-1956   Medicare Important Message Given:  Yes     Hannah Beat 08/11/2022, 12:24 PM

## 2022-08-11 NOTE — NC FL2 (Signed)
[place] MEDICAID FL2 LEVEL OF CARE SCREENING TOOL     IDENTIFICATION  Patient Name: Connie Wilson Birthdate: 1956-04-24 Sex: female Admission Date (Current Location): 08/06/2022  [facility] and [place] Number:  Herbalist and Address:  [facility] [place], [place] [address], [place], [place] [address]      Provider Number: [id number]  Attending Physician Name and Address:  Charlynne Cousins, MD  Relative Name and Phone Number:  Eulis Foster Daughter [phone]    Current Level of Care: Hospital Recommended Level of Care: [place] Prior Approval Number:    Date Approved/Denied:   PASRR Number:    Discharge Plan: SNF    Current Diagnoses: Patient Active Problem List   Diagnosis Date Noted   Pressure injury of skin [id number]   Acute metabolic encephalopathy 0000000   Sepsis ([place]) 08/07/2022   PVC's (premature ventricular contractions) 11/15/2021   SOB (shortness of breath) 11/15/2021   Arthritis of right knee 07/08/2021   Episode of recurrent major depressive disorder ([place]) 07/08/2021   Sinus pressure 05/22/2019   Low back pain 01/04/2018   Insomnia 10/19/2016   Dizziness 10/19/2016   Recurrent Clostridium difficile diarrhea 11/12/2015   Elevated glucose 09/29/2012   HTN (hypertension) 01/06/2012   Hyperlipidemia 01/06/2012    Orientation RESPIRATION BLADDER Height & Weight     Self  Normal Incontinent Weight: 201 lb 11.5 oz (91.5 kg) Height:     BEHAVIORAL SYMPTOMS/MOOD NEUROLOGICAL BOWEL NUTRITION STATUS      Continent Diet (see discharge summary)  AMBULATORY STATUS COMMUNICATION OF NEEDS Skin   Total Care Verbally Other (Comment), PU Stage and Appropriate Care (Unstageable pressure injury to right buttock at gluteal cleft, black eschar. Left buttock, at gluteal cleft there are 3 Unstageable wounds covered with black eschar.)                       Personal Care Assistance Level of Assistance   Bathing, Feeding, Dressing, Total care Bathing Assistance: Maximum assistance Feeding assistance: Independent Dressing Assistance: Maximum assistance Total Care Assistance: Maximum assistance   Functional Limitations Info  Sight, Hearing, Speech Sight Info: Adequate Hearing Info: Adequate Speech Info: Adequate    SPECIAL CARE FACTORS FREQUENCY  PT (By licensed PT), OT (By licensed OT)     PT Frequency: 5x week OT Frequency: 5x week            Contractures Contractures Info: Not present    Additional Factors Info  Code Status, Allergies, Insulin Sliding Scale Code Status Info: full Allergies Info: Clindamycin/lincomycin, Penicillins   Insulin Sliding Scale Info: Novolog: see discharge summary       Current Medications (08/11/2022):  This is the current hospital active medication list Current Facility-Administered Medications  Medication Dose Route Frequency Provider Last Rate Last Admin   acetaminophen (TYLENOL) tablet 650 mg  650 mg Oral Q6H PRN Winferd Humphrey, PA-C       Or   acetaminophen (TYLENOL) suppository 650 mg  650 mg Rectal Q6H PRN Winferd Humphrey, PA-C       albuterol (PROVENTIL) (2.5 MG/3ML) 0.083% nebulizer solution 2.5 mg  2.5 mg Nebulization Q2H PRN Winferd Humphrey, PA-C       aspirin chewable tablet 81 mg  81 mg Oral Daily Winferd Humphrey, PA-C   81 mg at 08/11/22 [id number]   calcium citrate (CALCITRATE - dosed in mg elemental calcium) tablet 200 mg of elemental calcium  1 tablet Oral BID Aileen Fass,  Tammi Klippel, MD   200 mg of elemental calcium at 08/11/22 0953   citalopram (CELEXA) tablet 40 mg  40 mg Oral Daily Winferd Humphrey, PA-C   40 mg at 08/11/22 3299   heparin injection 5,000 Units  5,000 Units Subcutaneous Q8H Winferd Humphrey, PA-C   5,000 Units at 08/11/22 2426   insulin aspart (novoLOG) injection 0-9 Units  0-9 Units Subcutaneous TID WC Winferd Humphrey, PA-C   1 Units at 08/10/22 1732   leptospermum manuka honey (MEDIHONEY) paste 1  Application  1 Application Topical BID Winferd Humphrey, PA-C   1 Application at 83/41/96 1041   morphine (PF) 2 MG/ML injection 1 mg  1 mg Intravenous Q4H PRN Charlynne Cousins, MD   1 mg at 08/11/22 0906   ondansetron (ZOFRAN) tablet 4 mg  4 mg Oral Q6H PRN Winferd Humphrey, PA-C       Or   ondansetron [facility]) injection 4 mg  4 mg Intravenous Q6H PRN Winferd Humphrey, PA-C       piperacillin-tazobactam (ZOSYN) IVPB 3.375 g  3.375 g Intravenous Q8H Gherghe, Costin M, MD 12.5 mL/hr at 08/11/22 1000 3.375 g at 08/11/22 1000   traMADol (ULTRAM) tablet 50 mg  50 mg Oral Q6H PRN Georganna Skeans, MD       vancomycin (VANCOREADY) IVPB 1250 mg/250 mL  1,250 mg Intravenous Q24H Winferd Humphrey, PA-C 166.7 mL/hr at 08/11/22 0031 1,250 mg at 08/11/22 0031     Discharge Medications: Please see discharge summary for a list of discharge medications.  Relevant Imaging Results:  Relevant Lab Results:   Additional Information SSN: [id number].  Pt is vaccinated for covid but not boosted.  Joanne Chars, LCSW

## 2022-08-12 DIAGNOSIS — R6510 Systemic inflammatory response syndrome (SIRS) of non-infectious origin without acute organ dysfunction: Secondary | ICD-10-CM | POA: Diagnosis not present

## 2022-08-12 DIAGNOSIS — E119 Type 2 diabetes mellitus without complications: Secondary | ICD-10-CM

## 2022-08-12 DIAGNOSIS — G9341 Metabolic encephalopathy: Secondary | ICD-10-CM | POA: Diagnosis not present

## 2022-08-12 LAB — GLUCOSE, CAPILLARY
Glucose-Capillary: 105 mg/dL — ABNORMAL HIGH (ref 70–99)
Glucose-Capillary: 100 mg/dL — ABNORMAL HIGH (ref 70–99)
Glucose-Capillary: 131 mg/dL — ABNORMAL HIGH (ref 70–99)
Glucose-Capillary: 114 mg/dL — ABNORMAL HIGH (ref 70–99)

## 2022-08-12 MED ORDER — SULFAMETHOXAZOLE-TRIMETHOPRIM 800-160 MG PO TABS
1.0000 | ORAL_TABLET | Freq: Two times a day (BID) | ORAL | Status: DC
  Administered 2022-08-12 – 2022-08-17 (×11): 1 via ORAL
  Filled 2022-08-12 (×11): qty 1

## 2022-08-12 MED ORDER — OXYCODONE HCL 5 MG PO TABS
5.0000 mg | ORAL_TABLET | Freq: Four times a day (QID) | ORAL | Status: DC | PRN
  Administered 2022-08-12 – 2022-08-13 (×2): 5 mg via ORAL
  Filled 2022-08-12 (×2): qty 1

## 2022-08-12 NOTE — Anesthesia Postprocedure Evaluation (Signed)
Anesthesia Post Note  Patient: Connie Wilson  Procedure(s) Performed: IRRIGATION AND DEBRIDEMENT GLUTEAL WOUND     Patient location during evaluation: PACU Anesthesia Type: General Level of consciousness: awake and alert Pain management: pain level controlled Vital Signs Assessment: post-procedure vital signs reviewed and stable Respiratory status: spontaneous breathing, nonlabored ventilation, respiratory function stable and patient connected to nasal cannula oxygen Cardiovascular status: blood pressure returned to baseline and stable Postop Assessment: no apparent nausea or vomiting Anesthetic complications: no   No notable events documented.  Last Vitals:  Vitals:   08/11/22 1606 08/11/22 2111  BP: (!) 152/77   Pulse: 81   Resp:    Temp: 36.9 C 36.9 C  SpO2: 99% 99%    Last Pain:  Vitals:   08/11/22 2111  TempSrc: Oral  PainSc:                  [place] S

## 2022-08-12 NOTE — Progress Notes (Signed)
[facility] PROGRESS NOTE    Progress Note  Connie Wilson  MPN:[id number] DOB: 1955-12-18 DOA: 08/06/2022 PCP: Milford Cage, PA     Brief Narrative:   Connie Wilson is an 66 y.o. female past medical history of diabetes mellitus type 2, essential hypertension TIA recurrent UTIs who presents to the ED after being found down at home she lives alone last time she was seen was about a week ago upon EMS arrival they found her confused soiled in feces and urine.   Assessment/Plan:   SIRS: Source is not entirely clear, question sacral decubitus ulcer versus UTI which is less likely. 1 out of 4 blood cultures grew gram-positive rods likely contaminant. White blood cell count this morning is 11 she has remained afebrile. Wound culture grew Proteus vulgaris Transition antibiotics to oral Bactrim. We will discuss with surgery. Physical therapy evaluated the patient she will need to go to skilled nursing facility.  Acute metabolic encephalopathy: Likely due to #1, was found on the floor for unclear amount of time thankfully her mental status seems to be improved with supportive treatment. No clear-cut of inciting incident.  History of CVA: Incidentally  on MRI, neurology was curb sided recommended no further management. Continue aspirin and statins. Physical therapy evaluation was done and she will need inpatient rehab.  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 General surgery was consulted she was taken to the OR on 10/09/2022 for debridement. Wound cultures grew Proteus vulgaris sensitive to Bactrim and Cipro.  She will need at least 2 weeks of antibiotics Surgery recommended twice daily wet-to-dry dressing no further debridement.   She will probably benefit from chemical debridement (Santyl) in the  future.  Controlled diabetes mellitus type 2: Last A1c of 6.6 to, relatively well-controlled sliding scale insulin continue current regimen.  Essential hypertension: Her pressure is low post op Continue lisinopril and clonidine. KVO IV fluids.  Depression: Continue Celexa.  Hypokalemia: Repleted now resolved.  Hypomagnesemia: Check a mag in am.  DVT prophylaxis: Lovenox Family Communication:None Status is: Inpatient Remains inpatient appropriate because: SIRS with stage IV sacral decub    Code Status:     Code Status Orders  (From admission, onward)           Start     Ordered   08/07/22 0134  Full code  Continuous        08/07/22 0136           Code Status History     Date Active Date Inactive Code Status Order ID Comments User Context   04/06/2013 1828 04/06/2013 2256 Full Code [id number]  Domenic Moras, PA-C ED         IV Access:   Peripheral IV   Procedures and diagnostic studies:   No results found.   Medical Consultants:   None.   Subjective:    Connie Wilson has no complaints today.  Objective:    Vitals:   08/11/22 0722 08/11/22 1100 08/11/22 1606 08/11/22 2111  BP: 127/71  (!) 152/77   Pulse: 79  81   Resp:      Temp: 98.1 F (36.7 C) 98.7 F (37.1 C) 98.5 F (36.9 C) 98.4 F (36.9 C)  TempSrc: Oral  Oral Oral  SpO2: 98%  99% 99%  Weight:  SpO2: 99 %   Intake/Output Summary (Last 24 hours) at 08/12/2022 0858 Last data filed at 08/12/2022 0459 Gross per 24 hour  Intake 740 ml  Output 1800 ml  Net -1060 ml    Filed Weights   08/08/22 1301  Weight: 91.5 kg    Exam: General exam: In no acute distress. Respiratory system: Good air movement and clear to auscultation. Cardiovascular system: S1 & S2 heard, RRR. No JVD. Gastrointestinal system: Abdomen is nondistended, soft and nontender.  Extremities: No pedal edema. Skin: No rashes, lesions or ulcers Psychiatry: Judgement and insight appear normal.  Mood & affect appropriate.   Data Reviewed:    Labs: Basic Metabolic Panel: Recent Labs  Lab 08/07/22 0522 08/08/22 0433 08/09/22 0349 08/10/22 0453 08/11/22 0347  NA 146* 139 137 137 138  K 3.8 3.0* 3.2* 3.4* 3.6  CL 113* 110 107 103 106  CO2 16* 21* 24 20* 25  GLUCOSE 143* 95 199* 156* 116*  BUN 41* 22 13 11 12   CREATININE 0.88 0.84 0.83 0.69 0.76  CALCIUM 9.0 8.4* 8.1* 8.5* 8.0*  MG  --  1.6* 1.9 1.8 1.7    GFR Estimated Creatinine Clearance: 81.8 mL/min (by C-G formula based on SCr of 0.76 mg/dL). Liver Function Tests: Recent Labs  Lab 08/06/22 2058 08/07/22 0522 08/08/22 0433 08/09/22 0349 08/10/22 0453  AST 36 34 32 31 23  ALT 30 25 29 30 25   ALKPHOS 103 80 74 72 66  BILITOT 1.2 1.1 0.8 0.7 0.3  PROT 7.9 6.2* 5.4* 5.2* 5.3*  ALBUMIN 3.0* 2.3* 2.1* 1.8* 1.9*    No results for input(s): "LIPASE", "AMYLASE" in the last 168 hours. Recent Labs  Lab 08/06/22 2058  AMMONIA 16    Coagulation profile No results for input(s): "INR", "PROTIME" in the last 168 hours. COVID-19 Labs  No results for input(s): "DDIMER", "FERRITIN", "LDH", "CRP" in the last 72 hours.  Lab Results  Component Value Date   [place] NEGATIVE 08/06/2022    CBC: Recent Labs  Lab 08/06/22 2058 08/07/22 0213 08/07/22 0522 08/08/22 0433 08/09/22 0349 08/10/22 0453 08/10/22 2100  WBC 27.8*   < > 27.7* 15.2* 10.8* 12.9* 11.1*  NEUTROABS 24.2*  --   --   --   --   --  7.6  HGB 16.9*   < > 14.8 11.9* 11.4* 10.9* 10.8*  HCT 50.6*   < > 46.4* 35.5* 33.5* 32.2* 31.6*  MCV 84.6   < > 90.3 84.5 84.6 83.6 84.0  PLT 301   < > 249 202 182 194 194   < > = values in this interval not displayed.    Cardiac Enzymes: Recent Labs  Lab 08/06/22 2058  CKTOTAL 417*    BNP (last 3 results) No results for input(s): "PROBNP" in the last 8760 hours. CBG: Recent Labs  Lab 08/11/22 0725 08/11/22 1125 08/11/22 1602 08/11/22 2205 08/12/22 0740  GLUCAP 107* 118* 122* 73 105*     D-Dimer: No results for input(s): "DDIMER" in the last 72 hours. Hgb A1c: No results for input(s): "HGBA1C" in the last 72 hours.  Lipid Profile: Recent Labs    08/10/22 0453  CHOL 149  HDL 28*  LDLCALC 87  TRIG 171*  CHOLHDL 5.3    Thyroid function studies: No results for input(s): "TSH", "T4TOTAL", "T3FREE", "THYROIDAB" in the last 72 hours.  Invalid input(s): "FREET3" Anemia work up: No results for input(s): "VITAMINB12", "FOLATE", "FERRITIN", "TIBC", "IRON", "RETICCTPCT" in the last 72 hours. Sepsis Labs:  Recent Labs  Lab 08/06/22 2255 08/07/22 0213 08/07/22 0522 08/08/22 0433 08/09/22 0349 08/10/22 0453 08/10/22 2100  WBC  --  28.0*   < > 15.2* 10.8* 12.9* 11.1*  LATICACIDVEN 2.0* 2.0*  --   --   --   --   --    < > = values in this interval not displayed.    Microbiology Recent Results (from the past 240 hour(s))  SARS Coronavirus 2 by RT PCR (hospital order, performed in [facility] hospital lab) *cepheid single result test* Anterior Nasal Swab     Status: None   Collection Time: 08/06/22  8:58 PM   Specimen: Anterior Nasal Swab  Result Value Ref Range Status   SARS Coronavirus 2 by RT PCR NEGATIVE NEGATIVE Final    Comment: (NOTE) SARS-CoV-2 target nucleic acids are NOT DETECTED.  The SARS-CoV-2 RNA is generally detectable in upper and lower respiratory specimens during the acute phase of infection. The lowest concentration of SARS-CoV-2 viral copies this assay can detect is 250 copies / mL. A negative result does not preclude SARS-CoV-2 infection and should not be used as the sole basis for treatment or other patient management decisions.  A negative result may occur with improper specimen collection / handling, submission of specimen other than nasopharyngeal swab, presence of viral mutation(s) within the areas targeted by this assay, and inadequate number of viral copies (<250 copies / mL). A negative result must be combined with  clinical observations, patient history, and epidemiological information.  Fact Sheet for Patients:   https://www.patel.info/  Fact Sheet for Healthcare Providers: https://hall.com/  This test is not yet approved or  cleared by the [place] FDA and has been authorized for detection and/or diagnosis of SARS-CoV-2 by FDA under an Emergency Use Authorization (EUA).  This EUA will remain in effect (meaning this test can be used) for the duration of the COVID-19 declaration under Section 564(b)(1) of the Act, 21 U.S.C. section 360bbb-3(b)(1), unless the authorization is terminated or revoked sooner.  Performed at [facility], [place] [address], [place], [place] [address]   Blood culture (routine x 2)     Status: None   Collection Time: 08/06/22  9:55 PM   Specimen: BLOOD  Result Value Ref Range Status   Specimen Description BLOOD LEFT ANTECUBITAL  Final   Special Requests   Final    BOTTLES DRAWN AEROBIC AND ANAEROBIC Blood Culture results may not be optimal due to an inadequate volume of blood received in culture bottles   Culture   Final    NO GROWTH 5 DAYS Performed at [facility], [place] [address] [place], [place] [address]    Report Status 08/11/2022 FINAL  Final  Blood culture (routine x 2)     Status: None (Preliminary result)   Collection Time: 08/06/22 10:01 PM   Specimen: BLOOD RIGHT HAND  Result Value Ref Range Status   Specimen Description BLOOD RIGHT HAND  Final   Special Requests   Final    BOTTLES DRAWN AEROBIC AND ANAEROBIC Blood Culture adequate volume   Culture  Setup Time   Final    GRAM POSITIVE RODS AEROBIC BOTTLE ONLY CRITICAL RESULT CALLED TO, READ BACK BY AND VERIFIED WITH: T [email or web address]  08/09/22 [place]    Culture   Final    GRAM POSITIVE RODS Sent to Reference Laboratory for identification and speciation. Performed at [facility], [place] [address] [place], [place] [address]     Report Status PENDING  Incomplete  Urine Culture     Status: Abnormal   Collection Time: 08/06/22 11:47 PM   Specimen: Urine, Clean Catch  Result Value Ref Range Status   Specimen Description URINE, CLEAN CATCH  Final   Special Requests   Final    NONE Performed at [facility], [address] [place], [place] [address]    Culture MULTIPLE SPECIES PRESENT, SUGGEST RECOLLECTION (A)  Final   Report Status 08/08/2022 FINAL  Final  Aerobic/Anaerobic Culture w Gram Stain (surgical/deep wound)     Status: None (Preliminary result)   Collection Time: 08/09/22 12:10 PM   Specimen: Other Source; Body Fluid  Result Value Ref Range Status   Specimen Description WOUND  Final   Special Requests  GLUTEAL  Final   Gram Stain   Final    RARE WBC PRESENT,BOTH PMN AND MONONUCLEAR RARE GRAM NEGATIVE RODS Performed at [facility], [place] [address] [place], [place] [address]    Culture   Final    FEW PROTEUS VULGARIS NO ANAEROBES ISOLATED; CULTURE IN PROGRESS FOR 5 DAYS    Report Status PENDING  Incomplete   Organism ID, Bacteria PROTEUS VULGARIS  Final      Susceptibility   Proteus vulgaris - MIC*    AMPICILLIN >=32 RESISTANT Resistant     CEFAZOLIN >=64 RESISTANT Resistant     CEFEPIME <=0.12 SENSITIVE Sensitive     CEFTAZIDIME <=1 SENSITIVE Sensitive     CIPROFLOXACIN <=0.25 SENSITIVE Sensitive     GENTAMICIN <=1 SENSITIVE Sensitive     IMIPENEM 8 INTERMEDIATE Intermediate     TRIMETH/SULFA <=20 SENSITIVE Sensitive     AMPICILLIN/SULBACTAM >=32 RESISTANT Resistant     PIP/TAZO <=4 SENSITIVE Sensitive     * FEW PROTEUS VULGARIS     Medications:    aspirin  81 mg Oral Daily   calcium citrate  1 tablet Oral BID   citalopram  40 mg Oral Daily   heparin  5,000 Units Subcutaneous Q8H   insulin aspart  0-9 Units Subcutaneous TID WC   leptospermum manuka honey  1 Application Topical BID   Continuous Infusions:  piperacillin-tazobactam (ZOSYN)  IV 3.375 g (08/12/22 0459)       LOS: 5 days   Charlynne Cousins  [facility]  08/12/2022, 8:58 AM

## 2022-08-12 NOTE — TOC Progression Note (Signed)
Transition of Care [facility]) - Progression Note    Patient Details  Name: Connie Wilson MRN: [id number] Date of Birth: 07/29/1956  [facility]) CM/SW Contact  Joanne Chars, LCSW Phone Number: 08/12/2022, 1:41 PM  Clinical Narrative:   level 2 passr docs uploaded.  CSW spoke with pt daughter April, updated that no offer from [facility] or [facility].  She is still requesting other options.  She does not want [facility] or [facility].  CSW reached out to [employer], [place], [place].  All still pending in hub.    Expected Discharge Plan: [place] Barriers to Discharge: Continued Medical Work up, SNF Pending bed offer  Expected Discharge Plan and Services Expected Discharge Plan: [place] In-house Referral: Clinical Social Work   Post Acute Care Choice: [place] Living arrangements for the past 2 months: Single Family Home                                       Social Determinants of Health (SDOH) Interventions    Readmission Risk Interventions     No data to display

## 2022-08-12 NOTE — Progress Notes (Signed)
Progress Note  3 Days Post-Op  Subjective: No new complaints. Having pain from wounds but controlled with medications.   Objective: Vital signs in last 24 hours: Temp:  [98.4 F (36.9 C)-98.7 F (37.1 C)] 98.4 F (36.9 C) (11/02 2111) Pulse Rate:  [81] 81 (11/02 1606) BP: (152)/(77) 152/77 (11/02 1606) SpO2:  [99 %] 99 % (11/02 2111) Last BM Date : 08/07/22  Intake/Output from previous day: 11/02 0701 - 11/03 0700 In: 740 [P.O.:240; IV Piggyback:400] Out: 1800 [Urine:1800] Intake/Output this shift: No intake/output data recorded.  PE: General: pleasant, WD, female who is laying in bed in NAD Lungs: Respiratory effort nonlabored MSK: all 4 extremities are symmetrical with no cyanosis, clubbing, or edema. Skin: warm and dry. Superior gluteal wounds: Right side wound with dry but soft wound base with more fibrinous tissue today. No undermining or tunneling. No purulent drainage. Caudal to open wounds there is maceration and epidermal sloughing to bilateral buttocks. See below Psych: A&Ox3 with an appropriate affect.         Lab Results:  Recent Labs    08/10/22 0453 08/10/22 2100  WBC 12.9* 11.1*  HGB 10.9* 10.8*  HCT 32.2* 31.6*  PLT 194 194    BMET Recent Labs    08/10/22 0453 08/11/22 0347  NA 137 138  K 3.4* 3.6  CL 103 106  CO2 20* 25  GLUCOSE 156* 116*  BUN 11 12  CREATININE 0.69 0.76  CALCIUM 8.5* 8.0*    PT/INR No results for input(s): "LABPROT", "INR" in the last 72 hours. CMP     Component Value Date/Time   NA 138 08/11/2022 0347   NA 139 02/06/2017 1010   K 3.6 08/11/2022 0347   CL 106 08/11/2022 0347   CO2 25 08/11/2022 0347   GLUCOSE 116 (H) 08/11/2022 0347   BUN 12 08/11/2022 0347   BUN 11 02/06/2017 1010   CREATININE 0.76 08/11/2022 0347   CREATININE 0.81 04/01/2016 0854   CALCIUM 8.0 (L) 08/11/2022 0347   PROT 5.3 (L) 08/10/2022 0453   ALBUMIN 1.9 (L) 08/10/2022 0453   AST 23 08/10/2022 0453   ALT 25 08/10/2022 0453    ALKPHOS 66 08/10/2022 0453   BILITOT 0.3 08/10/2022 0453   GFRNONAA >60 08/11/2022 0347   GFRAA 95 02/06/2017 1010   Lipase     Component Value Date/Time   LIPASE 91.0 (H) 01/08/2021 0938       Studies/Results: No results found.  Anti-infectives: Anti-infectives (From admission, onward)    Start     Dose/Rate Route Frequency Ordered Stop   08/09/22 1800  piperacillin-tazobactam (ZOSYN) IVPB 3.375 g        3.375 g 12.5 mL/hr over 240 Minutes Intravenous Every 8 hours 08/09/22 1733     08/07/22 2300  vancomycin (VANCOCIN) IVPB 1000 mg/200 mL premix  Status:  Discontinued        1,000 mg 200 mL/hr over 60 Minutes Intravenous Every 24 hours 08/06/22 2155 08/07/22 1159   08/07/22 2300  vancomycin (VANCOREADY) IVPB 1250 mg/250 mL  Status:  Discontinued        1,250 mg 166.7 mL/hr over 90 Minutes Intravenous Every 24 hours 08/07/22 1159 08/11/22 1540   08/07/22 0600  piperacillin-tazobactam (ZOSYN) IVPB 3.375 g  Status:  Discontinued        3.375 g 12.5 mL/hr over 240 Minutes Intravenous Every 8 hours 08/06/22 2155 08/07/22 0137   08/07/22 0600  piperacillin-tazobactam (ZOSYN) IVPB 3.375 g  Status:  Discontinued  3.375 g 12.5 mL/hr over 240 Minutes Intravenous Every 8 hours 08/07/22 0142 08/09/22 1733   08/07/22 0145  piperacillin-tazobactam (ZOSYN) IVPB 3.375 g  Status:  Discontinued        3.375 g 100 mL/hr over 30 Minutes Intravenous Every 8 hours 08/07/22 0136 08/07/22 0141   08/06/22 2130  piperacillin-tazobactam (ZOSYN) IVPB 3.375 g        3.375 g 100 mL/hr over 30 Minutes Intravenous  Once 08/06/22 2116 08/06/22 2301   08/06/22 2130  vancomycin (VANCOREADY) IVPB 1500 mg/300 mL        1,500 mg 150 mL/hr over 120 Minutes Intravenous  Once 08/06/22 2116 08/07/22 0039        Assessment/Plan Gluteal wound PO3 s/p OR debridement 10/31 Dr. [place] Silos - wound culture with proteus vulgaris. Continue abx.  - continue bid wet to dry dressing changes. No further surgical  debridement indicated. Right lateral wound with fibrinous tissue at base - added medihoney 11/2. We will be available as needed over the weekend and plan for reevaluation Monday. Please call if need over the weekend  FEN: carb mod ID: [email or web address] VTE: hep subq  I reviewed hospitalist notes, last 24 h vitals and pain scores, last 48 h intake and output, last 24 h labs and trends, and last 24 h imaging results.    LOS: 5 days   [facility] 08/12/2022, 8:39 AM Please see Amion for pager number during day hours 7:00am-4:30pm

## 2022-08-12 NOTE — Progress Notes (Signed)
Occupational Therapy Treatment Patient Details Name: Connie Wilson MRN: [id number] DOB: 1956/03/13 Today's Date: 08/12/2022   History of present illness Pt is a 66 y.o. female who presented 08/06/22 after being found down at home with AMS. Pt admitted with acute metabolic encephalopathy associated with sepsis. PMH: DM2, HTN, TIA, depression, anxiety, recurrent UTI, HLD.   OT comments  Pt. Seen for skilled OT treatment session.  Session limited today.  Initially saying no to all suggestions but was agreeable to bed mobility.  Able to assist with bed mobility in preparation for attempts at sitting.  Pt. Followed mod cues for sequencing to assist with using bed rails to pull up in bed.  Also rolled R with min a.  Once in sidelying pt. Declined further movement.  Education provided on benefits of trying to sit and participate.  Pt. Would not communicate further and closed eyes.   Will cont. To progress mobility and activity tolerance as precursors for adl tasks as pt. Able.      Recommendations for follow up therapy are one component of a multi-disciplinary discharge planning process, led by the attending physician.  Recommendations may be updated based on patient status, additional functional criteria and insurance authorization.    Follow Up Recommendations  Skilled nursing-short term rehab (<3 hours/day)    Assistance Recommended at Discharge Frequent or constant Supervision/Assistance  Patient can return home with the following  Two people to help with walking and/or transfers;A lot of help with bathing/dressing/bathroom;Assistance with cooking/housework;Direct supervision/assist for medications management;Direct supervision/assist for financial management;Assist for transportation;Help with stairs or ramp for entrance   Equipment Recommendations       Recommendations for Other Services      Precautions / Restrictions Precautions Precautions: Fall;Other (comment) Precaution Comments:  sacral and back wounds       Mobility Bed Mobility Overal bed mobility: Needs Assistance Bed Mobility: Rolling, Sidelying to Sit Rolling: Min assist         General bed mobility comments: pt. reuqired mod step by step cues for bed mobility.  reaching for bed rails in attempt to pull herself up, assisted with pad. Pt. Followed cues to bend knees and push with bles to come up towards top of bed.   pt. able to roll R without physical assistance.  once there said thats it, and would not attempt sidelying to sit    Transfers                         Balance                                           ADL either performed or assessed with clinical judgement   ADL                                         General ADL Comments: session focused on bed mobility as precurser for other tasks    Extremity/Trunk Assessment              Vision       Perception     Praxis      Cognition Arousal/Alertness: Awake/alert Behavior During Therapy: Flat affect  General Comments: difficult to assess, not communicating at all then would sporatically speak but not consistenlty        Exercises      Shoulder Instructions       General Comments      Pertinent Vitals/ Pain       Pain Assessment Pain Assessment: No/denies pain  Home Living                                          Prior Functioning/Environment              Frequency  Min 2X/week        Progress Toward Goals  OT Goals(current goals can now be found in the care plan section)  Progress towards OT goals: Progressing toward goals     Plan      Co-evaluation                 AM-PAC OT "6 Clicks" Daily Activity     Outcome Measure   Help from another person eating meals?: None Help from another person taking care of personal grooming?: A Little Help from another person toileting,  which includes using toliet, bedpan, or urinal?: Total Help from another person bathing (including washing, rinsing, drying)?: A Lot Help from another person to put on and taking off regular upper body clothing?: A Little Help from another person to put on and taking off regular lower body clothing?: Total 6 Click Score: 14    End of Session    OT Visit Diagnosis: Unsteadiness on feet (R26.81);Pain;Muscle weakness (generalized) (M62.81);Cognitive communication deficit (R41.841)   Activity Tolerance Patient limited by fatigue   Patient Left in bed;with call bell/phone within reach   Nurse Communication          Time: 1056-1106 OT Time Calculation (min): 10 min  Charges: OT General Charges $OT Visit: 1 Visit OT Treatments $Therapeutic Activity: 8-22 mins  Boneta Lucks, COTA/L Acute Rehabilitation [phone]   Alessandra Bevels Lorraine-COTA/L 08/12/2022, 12:39 PM

## 2022-08-13 DIAGNOSIS — L03312 Cellulitis of back [any part except buttock]: Secondary | ICD-10-CM | POA: Diagnosis not present

## 2022-08-13 DIAGNOSIS — A419 Sepsis, unspecified organism: Secondary | ICD-10-CM | POA: Diagnosis not present

## 2022-08-13 LAB — GLUCOSE, CAPILLARY
Glucose-Capillary: 110 mg/dL — ABNORMAL HIGH (ref 70–99)
Glucose-Capillary: 160 mg/dL — ABNORMAL HIGH (ref 70–99)
Glucose-Capillary: 127 mg/dL — ABNORMAL HIGH (ref 70–99)
Glucose-Capillary: 171 mg/dL — ABNORMAL HIGH (ref 70–99)

## 2022-08-13 NOTE — Progress Notes (Signed)
Dressing performed to rt buttock pressure wound

## 2022-08-13 NOTE — Progress Notes (Signed)
[facility] PROGRESS NOTE    Progress Note  Connie Wilson  XAJ:[id number] DOB: [birth year] DOA: 08/06/2022 PCP: Paschal Dopp, PA     Brief Narrative:   Connie Wilson is an 66 y.o. female past medical history of diabetes mellitus type 2, essential hypertension TIA recurrent UTIs who presents to the ED after being found down at home she lives alone last time she was seen was about a week ago upon EMS arrival they found her confused soiled in feces and urine. Medically stable for transfer to skilled nursing facility   Assessment/Plan:   SIRS: Source is not entirely clear, question sacral decubitus ulcer versus UTI which is less likely. 1 out of 4 blood cultures grew gram-positive rods likely contaminant. She has remained afebrile Wound culture grew Proteus vulgaris sensitive to Bactrim. Now on Bactrim, We will discuss with surgery. Physical therapy evaluated the patient she will need to go to skilled nursing facility. Patient is medically stable to transfer to skilled nursing facility  Acute metabolic encephalopathy: Likely due to #1, was found on the floor for unclear amount of time thankfully her mental status seems to be improved with supportive treatment. No clear-cut of inciting incident, possibly infectious etiology.  History of CVA: Incidentally  on MRI, neurology was curb sided recommended no further management. Continue aspirin and statins. Physical therapy evaluation was done and she will need inpatient rehab.  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  Per daughter she was independent and  driving  and was at works as a Lawyer until a couple of weeks ago. General surgery was consulted she was taken to the OR on 10/09/2022 for debridement. Wound cultures grew Proteus vulgaris sensitive to Bactrim.Marland Kitchen  She will need at least 2 weeks of antibiotics Surgery recommended twice daily wet-to-dry dressing  no further debridement.   She will probably benefit from chemical debridement (Santyl) in the future.  Controlled diabetes mellitus type 2: Last A1c of 6.6 to, relatively well-controlled sliding scale insulin continue current regimen.  Essential hypertension: Blood pressure is improved continue lisinopril and clonidine. KVO IV fluids.  Depression: Continue Celexa.  Hypokalemia: Repleted now resolved.  Hypomagnesemia: Check a mag in am.  DVT prophylaxis: Lovenox Family Communication:None Status is: Inpatient Remains inpatient appropriate because: SIRS with stage IV sacral decub    Code Status:     Code Status Orders  (From admission, onward)           Start     Ordered   08/07/22 0134  Full code  Continuous        08/07/22 0136           Code Status History     Date Active Date Inactive Code Status Order ID Comments User Context   04/06/2013 1828 04/06/2013 2256 Full Code [id number]  Fayrene Helper, PA-C ED         IV Access:   Peripheral IV   Procedures and diagnostic studies:   No results found.   Medical Consultants:   None.   Subjective:    Connie Wilson no complaints today  Objective:    Vitals:   08/12/22 1400 08/12/22 1931 08/12/22 1932 08/13/22 0555  BP: 115/85 (!) 135/95 (!) 135/95 (!) 146/75  Pulse:  92 93 79  Resp:   16 12  Temp:  98 F (36.7 C) 98 F (36.7 C) 98.4 F (36.9 C)  TempSrc:  Oral Oral Oral  SpO2:  99% 99%  97%  Weight:       SpO2: 97 %   Intake/Output Summary (Last 24 hours) at 08/13/2022 0833 Last data filed at 08/13/2022 0115 Gross per 24 hour  Intake 600 ml  Output 2200 ml  Net -1600 ml    Filed Weights   08/08/22 1301  Weight: 91.5 kg    Exam: General exam: In no acute distress. Respiratory system: Good air movement and clear to auscultation. Cardiovascular system: S1 & S2 heard, RRR. No JVD. Gastrointestinal system: Abdomen is nondistended, soft and nontender.  Extremities: No pedal  edema. Skin: No rashes, lesions or ulcers Psychiatry: Judgement and insight appear normal. Mood & affect appropriate.   Data Reviewed:    Labs: Basic Metabolic Panel: Recent Labs  Lab 08/07/22 0522 08/08/22 0433 08/09/22 0349 08/10/22 0453 08/11/22 0347  NA 146* 139 137 137 138  K 3.8 3.0* 3.2* 3.4* 3.6  CL 113* 110 107 103 106  CO2 16* 21* 24 20* 25  GLUCOSE 143* 95 199* 156* 116*  BUN 41* 22 13 11 12   CREATININE 0.88 0.84 0.83 0.69 0.76  CALCIUM 9.0 8.4* 8.1* 8.5* 8.0*  MG  --  1.6* 1.9 1.8 1.7    GFR Estimated Creatinine Clearance: 81.8 mL/min (by C-G formula based on SCr of 0.76 mg/dL). Liver Function Tests: Recent Labs  Lab 08/06/22 2058 08/07/22 0522 08/08/22 0433 08/09/22 0349 08/10/22 0453  AST 36 34 32 31 23  ALT 30 25 29 30 25   ALKPHOS 103 80 74 72 66  BILITOT 1.2 1.1 0.8 0.7 0.3  PROT 7.9 6.2* 5.4* 5.2* 5.3*  ALBUMIN 3.0* 2.3* 2.1* 1.8* 1.9*    No results for input(s): "LIPASE", "AMYLASE" in the last 168 hours. Recent Labs  Lab 08/06/22 2058  AMMONIA 16    Coagulation profile No results for input(s): "INR", "PROTIME" in the last 168 hours. COVID-19 Labs  No results for input(s): "DDIMER", "FERRITIN", "LDH", "CRP" in the last 72 hours.  Lab Results  Component Value Date   SARSCOV2NAA NEGATIVE 08/06/2022    CBC: Recent Labs  Lab 08/06/22 2058 08/07/22 0213 08/07/22 0522 08/08/22 0433 08/09/22 0349 08/10/22 0453 08/10/22 2100  WBC 27.8*   < > 27.7* 15.2* 10.8* 12.9* 11.1*  NEUTROABS 24.2*  --   --   --   --   --  7.6  HGB 16.9*   < > 14.8 11.9* 11.4* 10.9* 10.8*  HCT 50.6*   < > 46.4* 35.5* 33.5* 32.2* 31.6*  MCV 84.6   < > 90.3 84.5 84.6 83.6 84.0  PLT 301   < > 249 202 182 194 194   < > = values in this interval not displayed.    Cardiac Enzymes: Recent Labs  Lab 08/06/22 2058  CKTOTAL 417*    BNP (last 3 results) No results for input(s): "PROBNP" in the last 8760 hours. CBG: Recent Labs  Lab 08/12/22 0740  08/12/22 1208 08/12/22 1738 08/12/22 1928 08/13/22 0746  GLUCAP 105* 100* 131* 114* 110*    D-Dimer: No results for input(s): "DDIMER" in the last 72 hours. Hgb A1c: No results for input(s): "HGBA1C" in the last 72 hours.  Lipid Profile: No results for input(s): "CHOL", "HDL", "LDLCALC", "TRIG", "CHOLHDL", "LDLDIRECT" in the last 72 hours.  Thyroid function studies: No results for input(s): "TSH", "T4TOTAL", "T3FREE", "THYROIDAB" in the last 72 hours.  Invalid input(s): "FREET3" Anemia work up: No results for input(s): "VITAMINB12", "FOLATE", "FERRITIN", "TIBC", "IRON", "RETICCTPCT" in the last 72 hours. Sepsis Labs:  Recent Labs  Lab 08/06/22 2255 08/07/22 0213 08/07/22 0522 08/08/22 0433 08/09/22 0349 08/10/22 0453 08/10/22 2100  WBC  --  28.0*   < > 15.2* 10.8* 12.9* 11.1*  LATICACIDVEN 2.0* 2.0*  --   --   --   --   --    < > = values in this interval not displayed.    Microbiology Recent Results (from the past 240 hour(s))  SARS Coronavirus 2 by RT PCR (hospital order, performed in [facility] hospital lab) *cepheid single result test* Anterior Nasal Swab     Status: None   Collection Time: 08/06/22  8:58 PM   Specimen: Anterior Nasal Swab  Result Value Ref Range Status   SARS Coronavirus 2 by RT PCR NEGATIVE NEGATIVE Final    Comment: (NOTE) SARS-CoV-2 target nucleic acids are NOT DETECTED.  The SARS-CoV-2 RNA is generally detectable in upper and lower respiratory specimens during the acute phase of infection. The lowest concentration of SARS-CoV-2 viral copies this assay can detect is 250 copies / mL. A negative result does not preclude SARS-CoV-2 infection and should not be used as the sole basis for treatment or other patient management decisions.  A negative result may occur with improper specimen collection / handling, submission of specimen other than nasopharyngeal swab, presence of viral mutation(s) within the areas targeted by this assay, and  inadequate number of viral copies (<250 copies / mL). A negative result must be combined with clinical observations, patient history, and epidemiological information.  Fact Sheet for Patients:   https://www.patel.info/  Fact Sheet for Healthcare Providers: https://hall.com/  This test is not yet approved or  cleared by the [place] FDA and has been authorized for detection and/or diagnosis of SARS-CoV-2 by FDA under an Emergency Use Authorization (EUA).  This EUA will remain in effect (meaning this test can be used) for the duration of the COVID-19 declaration under Section 564(b)(1) of the Act, 21 U.S.C. section 360bbb-3(b)(1), unless the authorization is terminated or revoked sooner.  Performed at [facility], [place] [address], [place], [place] [address]   Blood culture (routine x 2)     Status: None   Collection Time: 08/06/22  9:55 PM   Specimen: BLOOD  Result Value Ref Range Status   Specimen Description BLOOD LEFT ANTECUBITAL  Final   Special Requests   Final    BOTTLES DRAWN AEROBIC AND ANAEROBIC Blood Culture results may not be optimal due to an inadequate volume of blood received in culture bottles   Culture   Final    NO GROWTH 5 DAYS Performed at [facility], [place] [address] [place], [place] [address]    Report Status 08/11/2022 FINAL  Final  Blood culture (routine x 2)     Status: None (Preliminary result)   Collection Time: 08/06/22 10:01 PM   Specimen: BLOOD RIGHT HAND  Result Value Ref Range Status   Specimen Description BLOOD RIGHT HAND  Final   Special Requests   Final    BOTTLES DRAWN AEROBIC AND ANAEROBIC Blood Culture adequate volume   Culture  Setup Time   Final    GRAM POSITIVE RODS AEROBIC BOTTLE ONLY CRITICAL RESULT CALLED TO, READ BACK BY AND VERIFIED WITH: T RUDISILL,[email or web address]  08/09/22 [place]    Culture   Final    GRAM POSITIVE RODS Sent to Reference Laboratory for identification and  speciation. Performed at [facility], [place] [address] [place], [place] [address]    Report Status PENDING  Incomplete  Urine Culture     Status: Abnormal   Collection Time: 08/06/22 11:47 PM   Specimen: Urine, Clean Catch  Result Value Ref Range Status   Specimen Description URINE, CLEAN CATCH  Final   Special Requests   Final    NONE Performed at [facility], [address], [place], [place] [address]    Culture MULTIPLE SPECIES PRESENT, SUGGEST RECOLLECTION (A)  Final   Report Status 08/08/2022 FINAL  Final  Aerobic/Anaerobic Culture w Gram Stain (surgical/deep wound)     Status: None (Preliminary result)   Collection Time: 08/09/22 12:10 PM   Specimen: Other Source; Body Fluid  Result Value Ref Range Status   Specimen Description WOUND  Final   Special Requests  GLUTEAL  Final   Gram Stain   Final    RARE WBC PRESENT,BOTH PMN AND MONONUCLEAR RARE GRAM NEGATIVE RODS Performed at [facility], [address], [place], [place] [address]    Culture   Final    FEW PROTEUS VULGARIS NO ANAEROBES ISOLATED; CULTURE IN PROGRESS FOR 5 DAYS    Report Status PENDING  Incomplete   Organism ID, Bacteria PROTEUS VULGARIS  Final      Susceptibility   Proteus vulgaris - MIC*    AMPICILLIN >=32 RESISTANT Resistant     CEFAZOLIN >=64 RESISTANT Resistant     CEFEPIME <=0.12 SENSITIVE Sensitive     CEFTAZIDIME <=1 SENSITIVE Sensitive     CIPROFLOXACIN <=0.25 SENSITIVE Sensitive     GENTAMICIN <=1 SENSITIVE Sensitive     IMIPENEM 8 INTERMEDIATE Intermediate     TRIMETH/SULFA <=20 SENSITIVE Sensitive     AMPICILLIN/SULBACTAM >=32 RESISTANT Resistant     PIP/TAZO <=4 SENSITIVE Sensitive     * FEW PROTEUS VULGARIS     Medications:    aspirin  81 mg Oral Daily   calcium citrate  1 tablet Oral BID   citalopram  40 mg Oral Daily   heparin  5,000 Units Subcutaneous Q8H   insulin aspart  0-9 Units Subcutaneous TID WC   leptospermum manuka honey  1 Application Topical  BID   sulfamethoxazole-trimethoprim  1 tablet Oral Q12H   Continuous Infusions:      LOS: 6 days   Connie Wilson  [facility]  08/13/2022, 8:33 AM

## 2022-08-13 NOTE — TOC Progression Note (Signed)
Transition of Care [facility]) - Progression Note    Patient Details  Name: Connie Wilson MRN: [id number] Date of Birth: Jun 24, 1956  Transition of [facility] CM/SW Contact  Emeterio Reeve, [place] Phone Number: 08/13/2022, 4:25 PM  Clinical Narrative:     CSW spoke to pts daughter April by phone. April chose [facility]. CSW reached out to admission coordinator. Passr still pending.   Expected Discharge Plan: [place] Barriers to Discharge: Continued Medical Work up, SNF Pending bed offer  Expected Discharge Plan and Services Expected Discharge Plan: [place] In-house Referral: Clinical Social Work   Post Acute Care Choice: [place] arrangements for the past 2 months: [place] Determinants of Health (SDOH) Interventions    Readmission Risk Interventions     No data to display         Emeterio Reeve, [place] Social Worker

## 2022-08-14 DIAGNOSIS — L03312 Cellulitis of back [any part except buttock]: Secondary | ICD-10-CM | POA: Diagnosis not present

## 2022-08-14 DIAGNOSIS — A419 Sepsis, unspecified organism: Secondary | ICD-10-CM | POA: Diagnosis not present

## 2022-08-14 LAB — AEROBIC/ANAEROBIC CULTURE W GRAM STAIN (SURGICAL/DEEP WOUND)

## 2022-08-14 LAB — GLUCOSE, CAPILLARY
Glucose-Capillary: 186 mg/dL — ABNORMAL HIGH (ref 70–99)
Glucose-Capillary: 129 mg/dL — ABNORMAL HIGH (ref 70–99)
Glucose-Capillary: 138 mg/dL — ABNORMAL HIGH (ref 70–99)
Glucose-Capillary: 126 mg/dL — ABNORMAL HIGH (ref 70–99)

## 2022-08-14 LAB — BASIC METABOLIC PANEL
Anion gap: 9 (ref 5–15)
BUN: 16 mg/dL (ref 8–23)
CO2: 23 mmol/L (ref 22–32)
Calcium: 8.6 mg/dL — ABNORMAL LOW (ref 8.9–10.3)
Chloride: 99 mmol/L (ref 98–111)
Creatinine, Ser: 0.83 mg/dL (ref 0.44–1.00)
GFR, Estimated: >60 mL/min (ref >60)
Glucose, Bld: 162 mg/dL — ABNORMAL HIGH (ref 70–99)
Potassium: 3.9 mmol/L (ref 3.5–5.1)
Sodium: 131 mmol/L — ABNORMAL LOW (ref 135–145)

## 2022-08-14 LAB — MAGNESIUM: Magnesium: 1.8 mg/dL (ref 1.7–2.4)

## 2022-08-14 NOTE — Plan of Care (Signed)

## 2022-08-14 NOTE — Progress Notes (Signed)
This nurse set up chair to tranfers pt. From bed to chair and pt. Refused. " Just don't want to at this time, will do later" This nurse educated pt. On importance mobility.

## 2022-08-14 NOTE — Progress Notes (Signed)
[facility] PROGRESS NOTE    Progress Note  Connie Wilson  [id number] DOB: [birth year] DOA: 08/06/2022 PCP: Milford Cage, PA     Brief Narrative:   Connie Wilson is an 66 y.o. female past medical history of diabetes mellitus type 2, essential hypertension TIA recurrent UTIs who presents to the ED after being found down at home she lives alone last time she was seen was about a week ago upon EMS arrival they found her confused soiled in feces and urine. Medically stable for transfer to skilled nursing facility   Assessment/Plan:   SIRS: Source is not entirely clear, question sacral decubitus ulcer versus UTI which is less likely. 1 out of 4 blood cultures grew gram-positive rods likely contaminant. She has remained afebrile Wound culture grew Proteus vulgaris sensitive to Bactrim. Now on Bactrim, We will discuss with surgery, will probably need total of 2 weeks of antibiotics. Physical therapy evaluated the patient she will need to go to skilled nursing facility. Patient is medically stable to transfer to skilled nursing facility  Acute metabolic encephalopathy: Likely due to #1, was found on the floor for unclear amount of time thankfully her mental status seems to be improved with supportive treatment. No clear-cut of inciting incident, possibly infectious etiology.  History of CVA: Incidentally  on MRI, neurology was curb sided recommended no further management. Continue aspirin and statins. Physical therapy evaluation was done and she will need inpatient rehab.  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 General surgery was consulted she was taken to the OR on 10/09/2022 for debridement. Wound cultures grew Proteus vulgaris sensitive to Bactrim.Marland Kitchen  She will need at least 2 weeks of  antibiotics Surgery recommended twice daily wet-to-dry dressing no further debridement.   She will probably benefit from chemical debridement (Santyl) in the future.  Controlled diabetes mellitus type 2: Last A1c of 6.6 to, relatively well-controlled sliding scale insulin continue current regimen.  Essential hypertension: Blood pressure is improved continue lisinopril and clonidine. KVO IV fluids.  Depression: Continue Celexa.  Hypokalemia: Repleted now resolved.  Hypomagnesemia: Mag 1.7.  DVT prophylaxis: Lovenox Family Communication:None Status is: Inpatient Remains inpatient appropriate because: SIRS with stage IV sacral decub    Code Status:     Code Status Orders  (From admission, onward)           Start     Ordered   08/07/22 0134  Full code  Continuous        08/07/22 0136           Code Status History     Date Active Date Inactive Code Status Order ID Comments User Context   04/06/2013 1828 04/06/2013 2256 Full Code [id number]  Domenic Moras, PA-C ED         IV Access:   Peripheral IV   Procedures and diagnostic studies:   No results found.   Medical Consultants:   None.   Subjective:    Connie Wilson no complaints today  Objective:    Vitals:   08/13/22 0555 08/13/22 1315 08/13/22 1934 08/14/22 0438  BP: (!) 146/75 133/78 (!) 140/69 (!) 159/93  Pulse: 79 91 92   Resp: 12 18 15 18   Temp: 98.4 F (36.9 C) 98.3 F (36.8 C) 98 F (36.7 C)   TempSrc: Oral  SpO2: 97% 97% 99%   Weight:       SpO2: 99 %   Intake/Output Summary (Last 24 hours) at 08/14/2022 0755 Last data filed at 08/13/2022 1432 Gross per 24 hour  Intake 720 ml  Output 900 ml  Net -180 ml    Filed Weights   08/08/22 1301  Weight: 91.5 kg    Exam: General exam: In no acute distress. Respiratory system: Good air movement and clear to auscultation. Cardiovascular system: S1 & S2 heard, RRR. No JVD. Gastrointestinal system: Abdomen is  nondistended, soft and nontender.  Extremities: No pedal edema. Skin: No rashes, lesions or ulcers Psychiatry: Judgement and insight appear normal. Mood & affect appropriate.   Data Reviewed:    Labs: Basic Metabolic Panel: Recent Labs  Lab 08/08/22 0433 08/09/22 0349 08/10/22 0453 08/11/22 0347  NA 139 137 137 138  K 3.0* 3.2* 3.4* 3.6  CL 110 107 103 106  CO2 21* 24 20* 25  GLUCOSE 95 199* 156* 116*  BUN 22 13 11 12   CREATININE 0.84 0.83 0.69 0.76  CALCIUM 8.4* 8.1* 8.5* 8.0*  MG 1.6* 1.9 1.8 1.7    GFR Estimated Creatinine Clearance: 81.8 mL/min (by C-G formula based on SCr of 0.76 mg/dL). Liver Function Tests: Recent Labs  Lab 08/08/22 0433 08/09/22 0349 08/10/22 0453  AST 32 31 23  ALT 29 30 25   ALKPHOS 74 72 66  BILITOT 0.8 0.7 0.3  PROT 5.4* 5.2* 5.3*  ALBUMIN 2.1* 1.8* 1.9*    No results for input(s): "LIPASE", "AMYLASE" in the last 168 hours. No results for input(s): "AMMONIA" in the last 168 hours.  Coagulation profile No results for input(s): "INR", "PROTIME" in the last 168 hours. COVID-19 Labs  No results for input(s): "DDIMER", "FERRITIN", "LDH", "CRP" in the last 72 hours.  Lab Results  Component Value Date   [place] NEGATIVE 08/06/2022    CBC: Recent Labs  Lab 08/08/22 0433 08/09/22 0349 08/10/22 0453 08/10/22 2100  WBC 15.2* 10.8* 12.9* 11.1*  NEUTROABS  --   --   --  7.6  HGB 11.9* 11.4* 10.9* 10.8*  HCT 35.5* 33.5* 32.2* 31.6*  MCV 84.5 84.6 83.6 84.0  PLT 202 182 194 194    Cardiac Enzymes: No results for input(s): "CKTOTAL", "CKMB", "CKMBINDEX", "TROPONINI" in the last 168 hours.  BNP (last 3 results) No results for input(s): "PROBNP" in the last 8760 hours. CBG: Recent Labs  Lab 08/12/22 1928 08/13/22 0746 08/13/22 1155 08/13/22 1639 08/13/22 1938  GLUCAP 114* 110* 160* 127* 171*    D-Dimer: No results for input(s): "DDIMER" in the last 72 hours. Hgb A1c: No results for input(s): "HGBA1C" in the last  72 hours.  Lipid Profile: No results for input(s): "CHOL", "HDL", "LDLCALC", "TRIG", "CHOLHDL", "LDLDIRECT" in the last 72 hours.  Thyroid function studies: No results for input(s): "TSH", "T4TOTAL", "T3FREE", "THYROIDAB" in the last 72 hours.  Invalid input(s): "FREET3" Anemia work up: No results for input(s): "VITAMINB12", "FOLATE", "FERRITIN", "TIBC", "IRON", "RETICCTPCT" in the last 72 hours. Sepsis Labs: Recent Labs  Lab 08/08/22 0433 08/09/22 0349 08/10/22 0453 08/10/22 2100  WBC 15.2* 10.8* 12.9* 11.1*    Microbiology Recent Results (from the past 240 hour(s))  SARS Coronavirus 2 by RT PCR (hospital order, performed in [facility] hospital lab) *cepheid single result test* Anterior Nasal Swab     Status: None   Collection Time: 08/06/22  8:58 PM   Specimen: Anterior Nasal Swab  Result Value Ref Range Status  SARS Coronavirus 2 by RT PCR NEGATIVE NEGATIVE Final    Comment: (NOTE) SARS-CoV-2 target nucleic acids are NOT DETECTED.  The SARS-CoV-2 RNA is generally detectable in upper and lower respiratory specimens during the acute phase of infection. The lowest concentration of SARS-CoV-2 viral copies this assay can detect is 250 copies / mL. A negative result does not preclude SARS-CoV-2 infection and should not be used as the sole basis for treatment or other patient management decisions.  A negative result may occur with improper specimen collection / handling, submission of specimen other than nasopharyngeal swab, presence of viral mutation(s) within the areas targeted by this assay, and inadequate number of viral copies (<250 copies / mL). A negative result must be combined with clinical observations, patient history, and epidemiological information.  Fact Sheet for Patients:   https://www.patel.info/  Fact Sheet for Healthcare Providers: https://hall.com/  This test is not yet approved or  cleared by the [place] FDA and has been authorized for detection and/or diagnosis of SARS-CoV-2 by FDA under an Emergency Use Authorization (EUA).  This EUA will remain in effect (meaning this test can be used) for the duration of the COVID-19 declaration under Section 564(b)(1) of the Act, 21 U.S.C. section 360bbb-3(b)(1), unless the authorization is terminated or revoked sooner.  Performed at [facility], [place] [address] [place], [place] [address]   Blood culture (routine x 2)     Status: None   Collection Time: 08/06/22  9:55 PM   Specimen: BLOOD  Result Value Ref Range Status   Specimen Description BLOOD LEFT ANTECUBITAL  Final   Special Requests   Final    BOTTLES DRAWN AEROBIC AND ANAEROBIC Blood Culture results may not be optimal due to an inadequate volume of blood received in culture bottles   Culture   Final    NO GROWTH 5 DAYS Performed at [facility], [place] [address] [place], [place] [address]    Report Status 08/11/2022 FINAL  Final  Blood culture (routine x 2)     Status: None (Preliminary result)   Collection Time: 08/06/22 10:01 PM   Specimen: BLOOD RIGHT HAND  Result Value Ref Range Status   Specimen Description BLOOD RIGHT HAND  Final   Special Requests   Final    BOTTLES DRAWN AEROBIC AND ANAEROBIC Blood Culture adequate volume   Culture  Setup Time   Final    GRAM POSITIVE RODS AEROBIC BOTTLE ONLY CRITICAL RESULT CALLED TO, READ BACK BY AND VERIFIED WITH: T [email or web address]  08/09/22 [place]    Culture   Final    GRAM POSITIVE RODS Sent to Reference Laboratory for identification and speciation. Performed at [facility], [place] [address], [place], [place] [address]    Report Status PENDING  Incomplete  Urine Culture     Status: Abnormal   Collection Time: 08/06/22 11:47 PM   Specimen: Urine, Clean Catch  Result Value Ref Range Status   Specimen Description URINE, CLEAN CATCH  Final   Special Requests   Final    NONE Performed at [place], [place] [address] [place], [place] [address]    Culture MULTIPLE SPECIES PRESENT, SUGGEST RECOLLECTION (A)  Final   Report Status 08/08/2022 FINAL  Final  Aerobic/Anaerobic Culture w Gram Stain (surgical/deep wound)     Status: None (Preliminary result)   Collection Time: 08/09/22 12:10 PM   Specimen: Other Source; Body Fluid  Result Value Ref Range Status   Specimen Description  WOUND  Final   Special Requests  GLUTEAL  Final   Gram Stain   Final    RARE WBC PRESENT,BOTH PMN AND MONONUCLEAR RARE GRAM NEGATIVE RODS Performed at [facility], [place] [address], [place], [place] [address]    Culture   Final    FEW PROTEUS VULGARIS NO ANAEROBES ISOLATED; CULTURE IN PROGRESS FOR 5 DAYS    Report Status PENDING  Incomplete   Organism ID, Bacteria PROTEUS VULGARIS  Final      Susceptibility   Proteus vulgaris - MIC*    AMPICILLIN >=32 RESISTANT Resistant     CEFAZOLIN >=64 RESISTANT Resistant     CEFEPIME <=0.12 SENSITIVE Sensitive     CEFTAZIDIME <=1 SENSITIVE Sensitive     CIPROFLOXACIN <=0.25 SENSITIVE Sensitive     GENTAMICIN <=1 SENSITIVE Sensitive     IMIPENEM 8 INTERMEDIATE Intermediate     TRIMETH/SULFA <=20 SENSITIVE Sensitive     AMPICILLIN/SULBACTAM >=32 RESISTANT Resistant     PIP/TAZO <=4 SENSITIVE Sensitive     * FEW PROTEUS VULGARIS     Medications:    aspirin  81 mg Oral Daily   calcium citrate  1 tablet Oral BID   citalopram  40 mg Oral Daily   heparin  5,000 Units Subcutaneous Q8H   insulin aspart  0-9 Units Subcutaneous TID WC   leptospermum manuka honey  1 Application Topical BID   sulfamethoxazole-trimethoprim  1 tablet Oral Q12H   Continuous Infusions:      LOS: 7 days   Charlynne Cousins  [facility]  08/14/2022, 7:55 AM

## 2022-08-15 LAB — GLUCOSE, CAPILLARY
Glucose-Capillary: 139 mg/dL — ABNORMAL HIGH (ref 70–99)
Glucose-Capillary: 132 mg/dL — ABNORMAL HIGH (ref 70–99)
Glucose-Capillary: 140 mg/dL — ABNORMAL HIGH (ref 70–99)
Glucose-Capillary: 135 mg/dL — ABNORMAL HIGH (ref 70–99)

## 2022-08-15 MED ORDER — LACTATED RINGERS IV BOLUS
500.0000 mL | Freq: Once | INTRAVENOUS | Status: AC
  Administered 2022-08-15: 500 mL via INTRAVENOUS

## 2022-08-15 MED ORDER — HYDROCHLOROTHIAZIDE 12.5 MG PO TABS
12.5000 mg | ORAL_TABLET | Freq: Every day | ORAL | Status: DC
  Administered 2022-08-15 – 2022-08-16 (×2): 12.5 mg via ORAL
  Filled 2022-08-15 (×2): qty 1

## 2022-08-15 MED ORDER — CHLORHEXIDINE GLUCONATE CLOTH 2 % EX PADS
6.0000 | MEDICATED_PAD | Freq: Every day | CUTANEOUS | Status: DC
  Administered 2022-08-15 – 2022-08-17 (×2): 6 via TOPICAL

## 2022-08-15 MED ORDER — NYSTATIN 100000 UNIT/GM EX CREA
TOPICAL_CREAM | Freq: Two times a day (BID) | CUTANEOUS | Status: DC
  Filled 2022-08-15 (×2): qty 30

## 2022-08-15 MED ORDER — CLONIDINE HCL 0.2 MG PO TABS
0.2000 mg | ORAL_TABLET | Freq: Two times a day (BID) | ORAL | Status: DC
  Administered 2022-08-15 – 2022-08-16 (×2): 0.2 mg via ORAL
  Filled 2022-08-15 (×3): qty 1

## 2022-08-15 MED ORDER — LISINOPRIL 20 MG PO TABS
20.0000 mg | ORAL_TABLET | Freq: Every day | ORAL | Status: DC
  Administered 2022-08-15 – 2022-08-16 (×2): 20 mg via ORAL
  Filled 2022-08-15 (×2): qty 1

## 2022-08-15 NOTE — Discharge Instructions (Signed)
Wet to Dry WOUND CARE: - Change dressing twice daily - Supplies: sterile saline, kerlex, scissors, ABD pads, tape  Remove dressing and all packing carefully, moistening with sterile saline as needed to avoid packing/internal dressing sticking to the wound. 2.   Clean edges of skin around the wound with water/gauze, making sure there is no tape debris or leakage left on skin that could cause skin irritation or breakdown. 3.   Dampen and clean kerlex with sterile saline and pack wound from wound base to skin level, making sure to take note of any possible areas of wound tracking, tunneling and packing appropriately. Wound can be packed loosely. Trim kerlex to size if a whole kerlex is not required. 4.   Cover wound with a dry ABD pad and secure with tape.  5.   Write the date/time on the dry dressing/tape to better track when the last dressing change occurred. - apply any skin protectant/powder if recommended by clinician to protect skin/skin folds. - change dressing as needed if leakage occurs, wound gets contaminated, or patient requests to shower. - You may shower daily with wound open and following the shower the wound should be dried and a clean dressing placed.  - Medical grade tape as well as packing supplies can be found at [facility] on [address] or [facility] on [address]. The remaining supplies can be found at your local drug store, [employer] etc. 

## 2022-08-15 NOTE — Progress Notes (Addendum)
6 Days Post-Op  Subjective: CC: RN reports foley was placed for incontinence yesterday. Draining clear urine. Patient up with therapies to chair today. Reports she is tolerating diet without n/v. Bm this am. Tolerating dressing changes. No prn meds yesterday. No family in room.   Objective: Vital signs in last 24 hours: Temp:  [98.3 F (36.8 C)-98.4 F (36.9 C)] 98.4 F (36.9 C) (11/06 0801) Pulse Rate:  [95] 95 (11/06 0801) Resp:  [16] 16 (11/05 2045) BP: (154-156)/(77-87) 156/87 (11/06 0801) SpO2:  [98 %-100 %] 100 % (11/06 0807) Last BM Date : 08/14/22  Intake/Output from previous day: 11/05 0701 - 11/06 0700 In: 240 [P.O.:240] Out: 700 [Urine:700] Intake/Output this shift: Total I/O In: 240 [P.O.:240] Out: -   PE: Gen:  Alert, NAD, pleasant Gluteal Wound - R superior gluteal wound with mostly mixed granulation/adipose tissue at the base with an area of adherent fibrinous tissue on the medial aspect of the wound that with palpable bone below. This area was still sensate when trying to retract so nothing to debride at bedside right now. No drainage. L superior buttock wound with mixed granulation, adipose and granulation tissue without drainage. Periwound with candida like changes.    Lab Results:  No results for input(s): "WBC", "HGB", "HCT", "PLT" in the last 72 hours. BMET Recent Labs    08/14/22 0912  NA 131*  K 3.9  CL 99  CO2 23  GLUCOSE 162*  BUN 16  CREATININE 0.83  CALCIUM 8.6*   PT/INR No results for input(s): "LABPROT", "INR" in the last 72 hours. CMP     Component Value Date/Time   NA 131 (L) 08/14/2022 0912   NA 139 02/06/2017 1010   K 3.9 08/14/2022 0912   CL 99 08/14/2022 0912   CO2 23 08/14/2022 0912   GLUCOSE 162 (H) 08/14/2022 0912   BUN 16 08/14/2022 0912   BUN 11 02/06/2017 1010   CREATININE 0.83 08/14/2022 0912   CREATININE 0.81 04/01/2016 0854   CALCIUM 8.6 (L) 08/14/2022 0912   PROT 5.3 (L) 08/10/2022 0453   ALBUMIN 1.9 (L)  08/10/2022 0453   AST 23 08/10/2022 0453   ALT 25 08/10/2022 0453   ALKPHOS 66 08/10/2022 0453   BILITOT 0.3 08/10/2022 0453   GFRNONAA >60 08/14/2022 0912   GFRAA 95 02/06/2017 1010   Lipase     Component Value Date/Time   LIPASE 91.0 (H) 01/08/2021 0938    Studies/Results: No results found.  Anti-infectives: Anti-infectives (From admission, onward)    Start     Dose/Rate Route Frequency Ordered Stop   08/12/22 1000  sulfamethoxazole-trimethoprim (BACTRIM DS) 800-160 MG per tablet 1 tablet        1 tablet Oral Every 12 hours 08/12/22 0901     08/09/22 1800  piperacillin-tazobactam (ZOSYN) IVPB 3.375 g  Status:  Discontinued        3.375 g 12.5 mL/hr over 240 Minutes Intravenous Every 8 hours 08/09/22 1733 08/12/22 0901   08/07/22 2300  vancomycin (VANCOCIN) IVPB 1000 mg/200 mL premix  Status:  Discontinued        1,000 mg 200 mL/hr over 60 Minutes Intravenous Every 24 hours 08/06/22 2155 08/07/22 1159   08/07/22 2300  vancomycin (VANCOREADY) IVPB 1250 mg/250 mL  Status:  Discontinued        1,250 mg 166.7 mL/hr over 90 Minutes Intravenous Every 24 hours 08/07/22 1159 08/11/22 1540   08/07/22 0600  piperacillin-tazobactam (ZOSYN) IVPB 3.375 g  Status:  Discontinued  3.375 g 12.5 mL/hr over 240 Minutes Intravenous Every 8 hours 08/06/22 2155 08/07/22 0137   08/07/22 0600  piperacillin-tazobactam (ZOSYN) IVPB 3.375 g  Status:  Discontinued        3.375 g 12.5 mL/hr over 240 Minutes Intravenous Every 8 hours 08/07/22 0142 08/09/22 1733   08/07/22 0145  piperacillin-tazobactam (ZOSYN) IVPB 3.375 g  Status:  Discontinued        3.375 g 100 mL/hr over 30 Minutes Intravenous Every 8 hours 08/07/22 0136 08/07/22 0141   08/06/22 2130  piperacillin-tazobactam (ZOSYN) IVPB 3.375 g        3.375 g 100 mL/hr over 30 Minutes Intravenous  Once 08/06/22 2116 08/06/22 2301   08/06/22 2130  vancomycin (VANCOREADY) IVPB 1500 mg/300 mL        1,500 mg 150 mL/hr over 120 Minutes  Intravenous  Once 08/06/22 2116 08/07/22 0039        Assessment/Plan Gluteal wound POD6 s/p OR debridement 10/31 Dr. [place] Silos - Wound culture with proteus vulgaris. On bactrim, abx duration per primary - Wound looks clean. There is no further surgical debridement indicated at this time. Would recommend continuing bid wet to dry dressing changes with medihoney at the base. Will add nystatin cream for candida of periwound. Patient does not need foley from our standpoint, defer timing of removal per primary. Okay to start moving towards dispo from our standpoint (appears they are looking at [facility]). We will sign off at this time. Please call back with questions or concerns. I will arrange f/u in the office for first post op visit. Anticipate wound care clinic f/u following this.   FEN: carb mod ID: [email or web address] - 10/28 - 11/3. Bactrim 11/3 >> Last wbc downtrending. Afebrile.  VTE: hep subq Foley - per primary. Okay for removal from our standpoint.     LOS: 8 days    Jillyn Ledger , [facility] Surgery 08/15/2022, 10:59 AM Please see Amion for pager number during day hours 7:00am-4:30pm

## 2022-08-15 NOTE — Progress Notes (Addendum)
CHG bath given before foley insertion.  Peri area cleaned with soap/water/spray and peri wipes. Pt had stool in her crevices. Foley cath inserted w/o any problems/issues. Peri care done again with wipes. Barrier creme applied in peri area. Pts dressings changed in sacral area.  Pt has a habit of touching sacral area.  Pt given education on trying to keep are clean and as bacteria free as possible.  Pt asked if she is aware when she has a bowel movement. She stated yes, pt instructed to call staff when she is aware.

## 2022-08-15 NOTE — Progress Notes (Signed)
[facility] PROGRESS NOTE    Progress Note  Connie Wilson  YQM:[id number] DOB: [birth year] DOA: 08/06/2022 PCP: Paschal Dopp, PA     Brief Narrative:   Connie Wilson is an 66 y.o. female past medical history of diabetes mellitus type 2, essential hypertension TIA recurrent UTIs who presents to the ED after being found down at home she lives alone last time she was seen was about a week ago upon EMS arrival they found her confused soiled in feces and urine. Medically stable for transfer to skilled nursing facility   Assessment/Plan:   SIRS: Source is not entirely clear, question sacral decubitus ulcer versus UTI which is less likely. 1 out of 4 blood cultures grew gram-positive rods likely contaminant. Has remained afebrile. Wound culture grew Proteus vulgaris sensitive to Bactrim she will continue that for 2 weeks. Physical therapy evaluated the patient she will need to go to skilled nursing facility. Patient is medically stable to transfer to skilled nursing facility  Acute metabolic encephalopathy: Likely due to #1, was found on the floor for unclear amount of time thankfully her mental status seems to be improved with supportive treatment. No clear-cut of inciting incident, possibly infectious etiology.  History of CVA: Incidentally  on MRI, neurology was curb sided recommended no further management. Continue aspirin and statins. Physical therapy evaluation was done and she will need inpatient rehab.  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  Per daughter she was independent and  driving  and was at works as a Lawyer until a couple of weeks ago. General surgery was consulted she was taken to the OR on 10/09/2022 for debridement. Wound cultures grew Proteus vulgaris sensitive to Bactrim.Marland Kitchen  She will need at least 2 weeks of antibiotics Surgery recommended twice daily wet-to-dry dressing no further  debridement.   She will probably benefit from chemical debridement (Santyl) in the future.  Controlled diabetes mellitus type 2: Last A1c of 6.6 to, relatively well-controlled sliding scale insulin continue current regimen.  Essential hypertension: Blood pressure is elevated on lisinopril and clonidine. Blood pressure is elevated, to hydrochlorothiazide  Depression: Continue Celexa.  Hypokalemia: Repleted now resolved.  Hypomagnesemia: Mag 1.7.  DVT prophylaxis: Lovenox Family Communication:None Status is: Inpatient Remains inpatient appropriate because: SIRS with stage IV sacral decub    Code Status:     Code Status Orders  (From admission, onward)           Start     Ordered   08/07/22 0134  Full code  Continuous        08/07/22 0136           Code Status History     Date Active Date Inactive Code Status Order ID Comments User Context   04/06/2013 1828 04/06/2013 2256 Full Code [id number]  Fayrene Helper, PA-C ED         IV Access:   Peripheral IV   Procedures and diagnostic studies:   No results found.   Medical Consultants:   None.   Subjective:    Connie Wilson no complaints today. Objective:    Vitals:   08/14/22 2045 08/15/22 0600 08/15/22 0801 08/15/22 0807  BP:   (!) 156/87   Pulse:   95   Resp: 16     Temp:  98.3 F (36.8 C) 98.4 F (36.9 C)   TempSrc:   Oral   SpO2:   100% 100%  Weight:  SpO2: 100 %   Intake/Output Summary (Last 24 hours) at 08/15/2022 0834 Last data filed at 08/14/2022 2041 Gross per 24 hour  Intake 240 ml  Output 700 ml  Net -460 ml    Filed Weights   08/08/22 1301  Weight: 91.5 kg    Exam: General exam: In no acute distress. Respiratory system: Good air movement and clear to auscultation. Cardiovascular system: S1 & S2 heard, RRR. No JVD. Gastrointestinal system: Abdomen is nondistended, soft and nontender.  Extremities: No pedal edema. Skin: Stage IV significant venous  ulcer Psychiatry: Judgement and insight appear normal. Mood & affect appropriate.   Data Reviewed:    Labs: Basic Metabolic Panel: Recent Labs  Lab 08/09/22 0349 08/10/22 0453 08/11/22 0347 08/14/22 0912  NA 137 137 138 131*  K 3.2* 3.4* 3.6 3.9  CL 107 103 106 99  CO2 24 20* 25 23  GLUCOSE 199* 156* 116* 162*  BUN 13 11 12 16   CREATININE 0.83 0.69 0.76 0.83  CALCIUM 8.1* 8.5* 8.0* 8.6*  MG 1.9 1.8 1.7 1.8    GFR Estimated Creatinine Clearance: 78.8 mL/min (by C-G formula based on SCr of 0.83 mg/dL). Liver Function Tests: Recent Labs  Lab 08/09/22 0349 08/10/22 0453  AST 31 23  ALT 30 25  ALKPHOS 72 66  BILITOT 0.7 0.3  PROT 5.2* 5.3*  ALBUMIN 1.8* 1.9*    No results for input(s): "LIPASE", "AMYLASE" in the last 168 hours. No results for input(s): "AMMONIA" in the last 168 hours.  Coagulation profile No results for input(s): "INR", "PROTIME" in the last 168 hours. COVID-19 Labs  No results for input(s): "DDIMER", "FERRITIN", "LDH", "CRP" in the last 72 hours.  Lab Results  Component Value Date   SARSCOV2NAA NEGATIVE 08/06/2022    CBC: Recent Labs  Lab 08/09/22 0349 08/10/22 0453 08/10/22 2100  WBC 10.8* 12.9* 11.1*  NEUTROABS  --   --  7.6  HGB 11.4* 10.9* 10.8*  HCT 33.5* 32.2* 31.6*  MCV 84.6 83.6 84.0  PLT 182 194 194    Cardiac Enzymes: No results for input(s): "CKTOTAL", "CKMB", "CKMBINDEX", "TROPONINI" in the last 168 hours.  BNP (last 3 results) No results for input(s): "PROBNP" in the last 8760 hours. CBG: Recent Labs  Lab 08/14/22 0829 08/14/22 1215 08/14/22 1711 08/14/22 2038 08/15/22 0802  GLUCAP 186* 129* 138* 126* 139*    D-Dimer: No results for input(s): "DDIMER" in the last 72 hours. Hgb A1c: No results for input(s): "HGBA1C" in the last 72 hours.  Lipid Profile: No results for input(s): "CHOL", "HDL", "LDLCALC", "TRIG", "CHOLHDL", "LDLDIRECT" in the last 72 hours.  Thyroid function studies: No results for  input(s): "TSH", "T4TOTAL", "T3FREE", "THYROIDAB" in the last 72 hours.  Invalid input(s): "FREET3" Anemia work up: No results for input(s): "VITAMINB12", "FOLATE", "FERRITIN", "TIBC", "IRON", "RETICCTPCT" in the last 72 hours. Sepsis Labs: Recent Labs  Lab 08/09/22 0349 08/10/22 0453 08/10/22 2100  WBC 10.8* 12.9* 11.1*    Microbiology Recent Results (from the past 240 hour(s))  SARS Coronavirus 2 by RT PCR (hospital order, performed in [facility] hospital lab) *cepheid single result test* Anterior Nasal Swab     Status: None   Collection Time: 08/06/22  8:58 PM   Specimen: Anterior Nasal Swab  Result Value Ref Range Status   SARS Coronavirus 2 by RT PCR NEGATIVE NEGATIVE Final    Comment: (NOTE) SARS-CoV-2 target nucleic acids are NOT DETECTED.  The SARS-CoV-2 RNA is generally detectable in upper and lower respiratory  specimens during the acute phase of infection. The lowest concentration of SARS-CoV-2 viral copies this assay can detect is 250 copies / mL. A negative result does not preclude SARS-CoV-2 infection and should not be used as the sole basis for treatment or other patient management decisions.  A negative result may occur with improper specimen collection / handling, submission of specimen other than nasopharyngeal swab, presence of viral mutation(s) within the areas targeted by this assay, and inadequate number of viral copies (<250 copies / mL). A negative result must be combined with clinical observations, patient history, and epidemiological information.  Fact Sheet for Patients:   https://www.patel.info/  Fact Sheet for Healthcare Providers: https://hall.com/  This test is not yet approved or  cleared by the [place] FDA and has been authorized for detection and/or diagnosis of SARS-CoV-2 by FDA under an Emergency Use Authorization (EUA).  This EUA will remain in effect (meaning this test can be used) for  the duration of the COVID-19 declaration under Section 564(b)(1) of the Act, 21 U.S.C. section 360bbb-3(b)(1), unless the authorization is terminated or revoked sooner.  Performed at [facility], [place] [address] [place], [place] [address]   Blood culture (routine x 2)     Status: None   Collection Time: 08/06/22  9:55 PM   Specimen: BLOOD  Result Value Ref Range Status   Specimen Description BLOOD LEFT ANTECUBITAL  Final   Special Requests   Final    BOTTLES DRAWN AEROBIC AND ANAEROBIC Blood Culture results may not be optimal due to an inadequate volume of blood received in culture bottles   Culture   Final    NO GROWTH 5 DAYS Performed at [facility], [place] [address] [place], [place] [address]    Report Status 08/11/2022 FINAL  Final  Blood culture (routine x 2)     Status: None (Preliminary result)   Collection Time: 08/06/22 10:01 PM   Specimen: BLOOD RIGHT HAND  Result Value Ref Range Status   Specimen Description BLOOD RIGHT HAND  Final   Special Requests   Final    BOTTLES DRAWN AEROBIC AND ANAEROBIC Blood Culture adequate volume   Culture  Setup Time   Final    GRAM POSITIVE RODS AEROBIC BOTTLE ONLY CRITICAL RESULT CALLED TO, READ BACK BY AND VERIFIED WITH: T RUDISILL,[email or web address]  08/09/22 [place]    Culture   Final    GRAM POSITIVE RODS Sent to Reference Laboratory for identification and speciation. Performed at [facility], [place] [address], [place], [place] [address]    Report Status PENDING  Incomplete  Urine Culture     Status: Abnormal   Collection Time: 08/06/22 11:47 PM   Specimen: Urine, Clean Catch  Result Value Ref Range Status   Specimen Description URINE, CLEAN CATCH  Final   Special Requests   Final    NONE Performed at [facility], [place] [address], [place], [place] [address]    Culture MULTIPLE SPECIES PRESENT, SUGGEST RECOLLECTION (A)  Final   Report Status 08/08/2022 FINAL  Final  Aerobic/Anaerobic Culture w Gram Stain  (surgical/deep wound)     Status: None   Collection Time: 08/09/22 12:10 PM   Specimen: Other Source; Body Fluid  Result Value Ref Range Status   Specimen Description WOUND  Final   Special Requests  GLUTEAL  Final   Gram Stain   Final    RARE WBC PRESENT,BOTH PMN AND MONONUCLEAR RARE GRAM NEGATIVE RODS    Culture  Final    FEW PROTEUS VULGARIS NO ANAEROBES ISOLATED Performed at [facility], [address], [place], [place] [address]    Report Status 08/14/2022 FINAL  Final   Organism ID, Bacteria PROTEUS VULGARIS  Final      Susceptibility   Proteus vulgaris - MIC*    AMPICILLIN >=32 RESISTANT Resistant     CEFAZOLIN >=64 RESISTANT Resistant     CEFEPIME <=0.12 SENSITIVE Sensitive     CEFTAZIDIME <=1 SENSITIVE Sensitive     CIPROFLOXACIN <=0.25 SENSITIVE Sensitive     GENTAMICIN <=1 SENSITIVE Sensitive     IMIPENEM 8 INTERMEDIATE Intermediate     TRIMETH/SULFA <=20 SENSITIVE Sensitive     AMPICILLIN/SULBACTAM >=32 RESISTANT Resistant     PIP/TAZO <=4 SENSITIVE Sensitive     * FEW PROTEUS VULGARIS     Medications:    aspirin  81 mg Oral Daily   calcium citrate  1 tablet Oral BID   citalopram  40 mg Oral Daily   heparin  5,000 Units Subcutaneous Q8H   insulin aspart  0-9 Units Subcutaneous TID WC   leptospermum manuka honey  1 Application Topical BID   sulfamethoxazole-trimethoprim  1 tablet Oral Q12H   Continuous Infusions:      LOS: 8 days   Marinda Elk  [facility]  08/15/2022, 8:34 AM

## 2022-08-15 NOTE — Progress Notes (Signed)
Pt keeps taking tele leads off.

## 2022-08-15 NOTE — Progress Notes (Signed)
Physical Therapy Treatment Patient Details Name: Connie Wilson MRN: [id number] DOB: June 29, 1956 Today's Date: 08/15/2022   History of Present Illness Pt is a 66 y.o. female who presented 08/06/22 after being found down at home with AMS. Pt admitted with acute metabolic encephalopathy associated with sepsis. PMH: DM2, HTN, TIA, depression, anxiety, recurrent UTI, HLD.    PT Comments    Pt received in supine, soiled and pt had not notified nursing staff, pt agreeable to therapy session with emphasis on transfer training, seated/standing balance and safety instruction, and importance of using call bell to notify staff when pt needing hygiene assist. Pt impulsive and needing max safety/sequencing cues throughout for fall risk prevention, pt with poor standing balance at RW and needing min/modA for transfers and short distance gait tasks at bedside with AD. PA and RN entering room to assess her wounds at end of session so pt returned to sidelying in air bed for wound care and further hygiene assist. Pt continues to benefit from PT services to progress toward functional mobility goals.    Recommendations for follow up therapy are one component of a multi-disciplinary discharge planning process, led by the attending physician.  Recommendations may be updated based on patient status, additional functional criteria and insurance authorization.  Follow Up Recommendations  Skilled nursing-short term rehab (<3 hours/day) Can patient physically be transported by private vehicle: No   Assistance Recommended at Discharge Frequent or constant Supervision/Assistance  Patient can return home with the following A lot of help with walking and/or transfers;A lot of help with bathing/dressing/bathroom;Assistance with cooking/housework;Direct supervision/assist for medications management;Direct supervision/assist for financial management;Assist for transportation;Help with stairs or ramp for entrance   Equipment  Recommendations  Rolling walker (2 wheels)    Recommendations for Other Services       Precautions / Restrictions Precautions Precautions: Fall;Other (comment) Precaution Comments: sacral and back wounds Restrictions Weight Bearing Restrictions: No     Mobility  Bed Mobility Overal bed mobility: Needs Assistance Bed Mobility: Rolling, Sidelying to Sit Rolling: Supervision Sidelying to sit: Mod assist   Sit to supine: Min assist   General bed mobility comments: pt required mod step by step cues for bed mobility and self-assist using rails; pt cued for log roll technique due to wounds.    Transfers Overall transfer level: Needs assistance Equipment used: Rolling walker (2 wheels) Transfers: Sit to/from Stand, Bed to chair/wheelchair/BSC Sit to Stand: Mod assist, From elevated surface Stand pivot transfers: Min assist, From elevated surface Step pivot transfers: Mod assist       General transfer comment: minA initial stand pivot to elevated height BSC at bedside, increased assist to step pivot to chair see gait    Ambulation/Gait Ambulation/Gait assistance: Mod assist Gait Distance (Feet): 6 Feet (15ft to chair, seated break ~3 mins, then 65ft forward and pivotal steps from chair>bed) Assistive device: Rolling walker (2 wheels) Gait Pattern/deviations: Step-to pattern, Decreased step length - right, Decreased step length - left, Drifts right/left, Trunk flexed       General Gait Details: Pt with slow, small forward and pivotal steps, up to modA for safety and mod cues for step sequencing and RW use; no overt buckling but pt fatigues quickly and has significantly crouched/flexed trunk and downward gaze despite postural cues; pt impulsive to sit prior to being aware of whether she has reach proximity to surface behind her and does not reach out unless cued.   Balance Overall balance assessment: Needs assistance Sitting-balance support: Bilateral upper extremity supported,  Feet supported Sitting balance-Leahy Scale: Fair Sitting balance - Comments: fair static sitting with support; poor dynamic sitting while unsupported and impulsive so needs min guard or close supervision throughout Postural control:  (anterior lean while seated/standing and forward head/downward gaze with minimal correction) Standing balance support: Bilateral upper extremity supported, During functional activity Standing balance-Leahy Scale: Poor Standing balance comment: more impulsive this date and needing min/modA for static/dynamic standing tasks at [employer] Arousal/Alertness: Awake/alert Behavior During Therapy: Impulsive, Flat affect Overall Cognitive Status: Impaired/Different from baseline Area of Impairment: Orientation, Attention, Memory, Following commands, Safety/judgement, Awareness, Problem solving                 Orientation Level: Disoriented to, Place, Time, Situation Current Attention Level: Sustained Memory: Decreased recall of precautions, Decreased short-term memory Following Commands: Follows one step commands inconsistently, Follows one step commands with increased time Safety/Judgement: Decreased awareness of safety, Decreased awareness of deficits Awareness: Intellectual Problem Solving: Slow processing, Requires verbal cues, Requires tactile cues, Difficulty sequencing General Comments: Pt inconsistent with verbal responses, at times speaking (when PTA asking about her family/daughters). Pt in soiled bed but seemingly unaware of this or procedure to press call bell when needing hygiene assist/toieting assist. Pt impulsive with mobility once OOB and unaware of lines/leads. Pt lived alone prior to admission and this is likely not yet back to her baseline.        Exercises Other Exercises Other Exercises: seated BLE AROM: ankle pumps x10 with dense cues    General Comments General comments (skin integrity, edema,  etc.): HR 103 bpm supine/resting and up to 126 bpm with stand pivot transfers and toileting      Pertinent Vitals/Pain Pain Assessment Pain Assessment: Faces Faces Pain Scale: Hurts a little bit Pain Descriptors / Indicators: Discomfort, Grimacing Pain Intervention(s): Monitored during session, Repositioned (geomat cushion in chair)           PT Goals (current goals can now be found in the care plan section) Acute Rehab PT Goals Patient Stated Goal: none stated PT Goal Formulation: With patient Time For Goal Achievement: 08/21/22 Progress towards PT goals: Progressing toward goals    Frequency    Min 3X/week      PT Plan Current plan remains appropriate       AM-PAC PT "6 Clicks" Mobility   Outcome Measure  Help needed turning from your back to your side while in a flat bed without using bedrails?: A Little Help needed moving from lying on your back to sitting on the side of a flat bed without using bedrails?: A Lot Help needed moving to and from a bed to a chair (including a wheelchair)?: A Lot Help needed standing up from a chair using your arms (e.g., wheelchair or bedside chair)?: A Lot Help needed to walk in hospital room?: Total (<4ft) Help needed climbing 3-5 steps with a railing? : Total 6 Click Score: 11    End of Session Equipment Utilized During Treatment: Gait belt Activity Tolerance: Patient tolerated treatment well;Other (comment);Patient limited by fatigue (impulsivity and c/o fatigue limiting standing tolerance) Patient left: in bed;with call bell/phone within reach;with nursing/sitter in room (RN and PA in room to assess her wounds, pt S/L in bed) Nurse Communication: Mobility status;Precautions;Other (comment) (pt seems encephalopathic still, unsure of her baseline but lived alone prior) PT Visit Diagnosis:  Unsteadiness on feet (R26.81);Other abnormalities of gait and mobility (R26.89);Muscle weakness (generalized) (M62.81);History of falling  (Z91.81);Difficulty in walking, not elsewhere classified (R26.2);Pain Pain - part of body:  (bottom/low back)     Time: 6720-9470 PT Time Calculation (min) (ACUTE ONLY): 18 min  Charges:  $Therapeutic Activity: 8-22 mins                     Cadie Sorci P., PTA Acute Rehabilitation Services Secure Chat Preferred 9a-5:30pm Office: [phone]    Dorathy Kinsman [facility] 08/15/2022, 11:06 AM

## 2022-08-15 NOTE — Care Management Important Message (Signed)
Important Message  Patient Details  Name: Connie Wilson MRN: [id number] Date of Birth: 04-11-1956   Medicare Important Message Given:  Yes     Hannah Beat 08/15/2022, 12:33 PM

## 2022-08-15 NOTE — TOC Progression Note (Addendum)
Transition of Care [facility]) - Progression Note    Patient Details  Name: Connie Wilson MRN: [id number] Date of Birth: 08/21/1956  [facility] CM/SW Contact  Joanne Chars, LCSW Phone Number: 08/15/2022, 8:29 AM  Clinical Narrative:   CSW spoke with daughter April.  She is requesting help with medicaid, referral made to financial counseling.  She is also concerned with whether SNF can manage pt wound.  MD informed.  PASSR received: [id number]  2637: Attempted to start auth in [place], error message.  Called, they will look into it, [employer] does not appear to be managing this policy.     Expected Discharge Plan: [place] Barriers to Discharge: Continued Medical Work up, SNF Pending bed offer  Expected Discharge Plan and Services Expected Discharge Plan: [place] In-house Referral: Clinical Social Work   Post Acute Care Choice: [place] Living arrangements for the past 2 months: Single Family Home                                       Social Determinants of Health (SDOH) Interventions    Readmission Risk Interventions     No data to display

## 2022-08-15 NOTE — Consult Note (Signed)
WOC consult requested for sacrum wound.  Surgical team is following for assessment and plan of care after debridement in the OR and they have ordered topical treatment for bedside nurses to perform.  Please re-consult if further assistance is needed.  Thank-you,  Julien Girt MSN, [place], [place], [place], [place]

## 2022-08-16 DIAGNOSIS — E119 Type 2 diabetes mellitus without complications: Secondary | ICD-10-CM

## 2022-08-16 DIAGNOSIS — I10 Essential (primary) hypertension: Secondary | ICD-10-CM

## 2022-08-16 LAB — GLUCOSE, CAPILLARY
Glucose-Capillary: 147 mg/dL — ABNORMAL HIGH (ref 70–99)
Glucose-Capillary: 169 mg/dL — ABNORMAL HIGH (ref 70–99)
Glucose-Capillary: 127 mg/dL — ABNORMAL HIGH (ref 70–99)
Glucose-Capillary: 147 mg/dL — ABNORMAL HIGH (ref 70–99)

## 2022-08-16 LAB — BASIC METABOLIC PANEL
Anion gap: 9 (ref 5–15)
BUN: 43 mg/dL — ABNORMAL HIGH (ref 8–23)
CO2: 22 mmol/L (ref 22–32)
Calcium: 8.4 mg/dL — ABNORMAL LOW (ref 8.9–10.3)
Chloride: 102 mmol/L (ref 98–111)
Creatinine, Ser: 1.85 mg/dL — ABNORMAL HIGH (ref 0.44–1.00)
GFR, Estimated: 30 mL/min — ABNORMAL LOW (ref >60)
Glucose, Bld: 127 mg/dL — ABNORMAL HIGH (ref 70–99)
Potassium: 4.3 mmol/L (ref 3.5–5.1)
Sodium: 133 mmol/L — ABNORMAL LOW (ref 135–145)

## 2022-08-16 MED ORDER — SULFAMETHOXAZOLE-TRIMETHOPRIM 800-160 MG PO TABS: 1.0000 | ORAL_TABLET | Freq: Two times a day (BID) | ORAL | 0 refills | Status: AC

## 2022-08-16 MED ORDER — LISINOPRIL 20 MG PO TABS: 20.0000 mg | ORAL_TABLET | Freq: Every day | ORAL | 10 refills | Status: AC

## 2022-08-16 MED ORDER — SODIUM CHLORIDE 0.9 % IV BOLUS
1000.0000 mL | Freq: Once | INTRAVENOUS | Status: AC
  Administered 2022-08-16: 1000 mL via INTRAVENOUS

## 2022-08-16 MED ORDER — SODIUM CHLORIDE 0.9 % IV SOLN: INTRAVENOUS | Status: DC

## 2022-08-16 MED ORDER — LACTATED RINGERS IV BOLUS
500.0000 mL | Freq: Once | INTRAVENOUS | Status: AC
  Administered 2022-08-16: 500 mL via INTRAVENOUS

## 2022-08-16 MED ORDER — ASPIRIN 81 MG PO CHEW: 81.0000 mg | CHEWABLE_TABLET | Freq: Every day | ORAL | Status: AC

## 2022-08-16 MED ORDER — HYDROCHLOROTHIAZIDE 12.5 MG PO TABS: 12.5000 mg | ORAL_TABLET | Freq: Every day | ORAL | Status: AC

## 2022-08-16 NOTE — Progress Notes (Signed)
Pts wound changed.  Wet to dry with medhoney. Foam placed over dressing due to pt digging in wound.

## 2022-08-16 NOTE — Plan of Care (Signed)

## 2022-08-16 NOTE — TOC Progression Note (Addendum)
Transition of Care [facility]) - Progression Note    Patient Details  Name: Aryssa Rosamond MRN: [id number] Date of Birth: 06-19-1956  [facility]) CM/SW Contact  Joanne Chars, [place] Phone Number: 08/16/2022, 8:32 AM  Clinical Narrative:   Confirmation from navi, they do not manage this policy, auth will need to done directly through [facility].  1100: Several phone calls from daughter April, still has concerns about how pt wound is being managed.  Surgery never called her.  CSW messaged Dr Venetia Constable, he has reached out to surgery already.  CSW suggest to daughter she can call [place] surgery to request a call back.    1555: [place].  Josem Kaufmann has been approved.  Expected Discharge Plan: [place] Barriers to Discharge: Continued Medical Work up, SNF Pending bed offer  Expected Discharge Plan and Services Expected Discharge Plan: [place] In-house Referral: Clinical Social Work   Post Acute Care Choice: [place] arrangements for the past 2 months: Single Family Home Expected Discharge Date: 08/16/22                                     Social Determinants of Health (SDOH) Interventions    Readmission Risk Interventions     No data to display

## 2022-08-16 NOTE — Discharge Summary (Signed)
Physician Discharge Summary  Connie Wilson ONG:[id number] DOB: 11-29-1955 DOA: 08/06/2022  PCP: Connie Cage, PA  Admit date: 08/06/2022 Discharge date: 08/16/2022  Admitted From: Home Disposition:  SNF  Recommendations for Outpatient Follow-up:  Follow up with PCP in 1-2 weeks Please obtain BMP/CBC in one week   Home Health:No Equipment/Devices:None  Discharge Condition:Stable CODE STATUS:Full Diet recommendation: Heart Healthy  Brief/Interim Summary: 66 y.o. female past medical history of diabetes mellitus type 2, essential hypertension TIA recurrent UTIs who presents to the ED after being found down at home she lives alone last time she was seen was about a week ago upon EMS arrival they found her confused soiled in feces and urine. Medically stable for transfer to skilled nursing facility  Discharge Diagnoses:  Principal Problem:   Sepsis ([place]) Active Problems:   HTN (hypertension)   Hyperlipidemia   Acute metabolic encephalopathy   Pressure injury of skin   Controlled type 2 diabetes mellitus without complication, without long-term current use of insulin (HCC)  Sepsis due to sacral decubitus ulcer stage IV Question secondary cubitus ulcer versus UTI. Order for blood cultures were positive for gram-positive rods likely contaminant. She remained afebrile, she was transition to oral Bactrim should continue as an outpatient. Physical therapy evaluated the patient recommended to go home with skilled nursing facility.  Acute metabolic encephalopathy: Likely due to #1 after antibiotics and IV fluids encephalopathy resolved. There is likely due to infectious etiology.  History of CVA: Incidental on MRI neurology recommended no further imaging. She was continue on aspirin and statins.  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 General surgery was consulted she was taken to the OR for I&D. Culture grew Proteus vulgaris sensitive to Bactrim she will continue Bactrim for at least 2 weeks.  Controlled diabetes mellitus type 2: With an A1c of 6.6 continue current regimen no changes made.  Essential hypertension: Blood pressure was slightly elevated on lisinopril and clonidine, she was started on low-dose hydrochlorothiazide. PCP to check basic metabolic panel in 1 week.  Depression: Continue Celexa.  Hyperkalemia: Resolved.    Discharge Instructions  Discharge Instructions     Diet - low sodium heart healthy   Complete by: As directed    Discharge wound care:   Complete by: As directed    Per wound care nurse instructions   Increase activity slowly   Complete by: As directed       Allergies as of 08/16/2022       Reactions   Clindamycin/lincomycin Other (See Comments)   Pt report C-diff   Penicillins Rash   Tolerated zosyn 08/08/2022 Has patient had a PCN reaction causing immediate rash, facial/tongue/throat swelling, SOB or lightheadedness with hypotension: No (pt  Did have rash) Has patient had a PCN reaction causing severe rash involving mucus membranes or skin necrosis: No Has patient had a PCN reaction that required hospitalization: No Has patient had a PCN reaction occurring within the last 10 years:No If all of the above answers are "NO", then may proceed with Cephalosporin use.        Medication List     TAKE these medications    aspirin 81 MG chewable tablet Chew 1 tablet (81 mg total) by mouth daily.   calcium citrate 950 (200 Ca) MG tablet Commonly  known as: CALCITRATE - dosed in mg elemental calcium Take 1 tablet by mouth 2 (two) times daily.   citalopram 20 MG tablet Commonly known as: CELEXA Take 1 tablet (20 mg total) by mouth daily. What changed: how much to take   cloNIDine 0.2 MG tablet Commonly known  as: CATAPRES Take 0.2 mg by mouth 2 (two) times daily.   Fish Oil 1000 MG Caps Take by mouth.   hydrochlorothiazide 12.5 MG tablet Commonly known as: HYDRODIURIL Take 1 tablet (12.5 mg total) by mouth daily.   lisinopril 20 MG tablet Commonly known as: ZESTRIL Take 1 tablet by mouth once daily   metFORMIN 500 MG tablet Commonly known as: GLUCOPHAGE Take 500 mg by mouth daily with breakfast.   multivitamin tablet Take 1 tablet by mouth daily.   OVER THE COUNTER MEDICATION Vitamin D 3 one capsule daily.   pantoprazole 20 MG tablet Commonly known as: PROTONIX Take 20 mg by mouth daily.   sulfamethoxazole-trimethoprim 800-160 MG tablet Commonly known as: BACTRIM DS Take 1 tablet by mouth every 12 (twelve) hours for 14 days.   vitamin C 1000 MG tablet Take 1,000 mg by mouth daily.               Discharge Care Instructions  (From admission, onward)           Start     Ordered   08/16/22 0000  Discharge wound care:       Comments: Per wound care nurse instructions   08/16/22 0742            Follow-up Information     [facility], [facility] Follow up on 09/06/2022.   Specialty: General Surgery Why: 09/06/22 at 9 am. Please bring a copy of your photo ID and insurance card. Please arrive 30 minutes prior to your appointment for paperwork. Contact information: [address] [place] [address] [phone]                Allergies  Allergen Reactions   Clindamycin/Lincomycin Other (See Comments)    Pt report C-diff   Penicillins Rash    Tolerated zosyn 08/08/2022 Has patient had a PCN reaction causing immediate rash, facial/tongue/throat swelling, SOB or lightheadedness with hypotension: No (pt  Did have rash) Has patient had a PCN reaction causing severe rash involving mucus membranes or skin necrosis: No Has patient had a PCN reaction that required hospitalization: No Has patient had a PCN reaction occurring within the last  10 years:No If all of the above answers are "NO", then may proceed with Cephalosporin use.     Consultations: General surgery   Procedures/Studies: ECHOCARDIOGRAM COMPLETE  Result Date: 08/08/2022    ECHOCARDIOGRAM REPORT   Patient Name:   Connie Wilson Date of Exam: 08/08/2022 Medical Rec #:  [id number]       Height:       68.0 in Accession #:    [id number]      Weight:       216.8 lb Date of Birth:  Feb 21, 1956       BSA:          2.115 m Patient Age:    66 years        BP:           125/73 mmHg Patient Gender: F               HR:           108 bpm. Exam Location:  Inpatient Procedure: 2D Echo Indications:    syncope  History:        Patient has prior history of Echocardiogram examinations, most                 recent 11/30/2021. Risk Factors:Diabetes and Hypertension.  Sonographer:    Delcie Roch RDCS Referring Phys: [id number] SARA-MAIZ A THOMAS  Sonographer Comments: Image acquisition challenging due to patient body habitus. IMPRESSIONS  1. Left ventricular ejection fraction, by estimation, is 55%. The left ventricle has normal function. Left ventricular endocardial border not optimally defined to evaluate regional wall motion. Left ventricular diastolic parameters are consistent with Grade I diastolic dysfunction (impaired relaxation). There is abnormal septal motion secondary to conduction delay.  2. Right ventricular systolic function is mildly reduced particularly at the RV apex. The right ventricular size is normal. Tricuspid regurgitation signal is inadequate for assessing PA pressure.  3. The mitral valve is normal in structure. Trivial mitral valve regurgitation. No evidence of mitral stenosis.  4. The aortic valve is tricuspid. Aortic valve regurgitation is not visualized. No aortic stenosis is present.  5. The inferior vena cava is normal in size with greater than 50% respiratory variability, suggesting right atrial pressure of 3 mmHg. FINDINGS  Left Ventricle: Left ventricular ejection  fraction, by estimation, is 55%. The left ventricle has normal function. Left ventricular endocardial border not optimally defined to evaluate regional wall motion. The left ventricular internal cavity size was normal in size. There is no left ventricular hypertrophy. Abnormal septal motion secondary to conduction delay. Left ventricular diastolic parameters are consistent with Grade I diastolic dysfunction (impaired relaxation). Right Ventricle: The right ventricular size is normal. No increase in right ventricular wall thickness. Right ventricular systolic function is mildly reduced. Tricuspid regurgitation signal is inadequate for assessing PA pressure. Left Atrium: Left atrial size was normal in size. Right Atrium: Right atrial size was normal in size. Pericardium: There is no evidence of pericardial effusion. Mitral Valve: The mitral valve is normal in structure. Trivial mitral valve regurgitation. No evidence of mitral valve stenosis. Tricuspid Valve: The tricuspid valve is normal in structure. Tricuspid valve regurgitation is trivial. No evidence of tricuspid stenosis. Aortic Valve: The aortic valve is tricuspid. Aortic valve regurgitation is not visualized. No aortic stenosis is present. Pulmonic Valve: The pulmonic valve was normal in structure. Pulmonic valve regurgitation is not visualized. No evidence of pulmonic stenosis. Aorta: The aortic root is normal in size and structure. Venous: The inferior vena cava is normal in size with greater than 50% respiratory variability, suggesting right atrial pressure of 3 mmHg. IAS/Shunts: No atrial level shunt detected by color flow Doppler.  LEFT VENTRICLE PLAX 2D LVIDd:         4.50 cm     Diastology LVIDs:         3.20 cm     LV e' medial:    6.53 cm/s LV PW:         0.80 cm     LV E/e' medial:  7.2 LV IVS:        0.90 cm     LV e' lateral:   7.51 cm/s LVOT diam:     2.00 cm     LV E/e' lateral: 6.2 LV SV:         60 LV SV Index:   29 LVOT Area:     3.14 cm  LV  Volumes (MOD) LV vol d, MOD A2C: 60.2 ml LV vol s, MOD A2C: 29.2  ml LV SV MOD A2C:     31.0 ml RIGHT VENTRICLE             IVC RV S prime:     11.30 cm/s  IVC diam: 1.40 cm TAPSE (M-mode): 1.2 cm LEFT ATRIUM             Index        RIGHT ATRIUM           Index LA diam:        3.20 cm 1.51 cm/m   RA Area:     10.10 cm LA Vol (A2C):   30.9 ml 14.61 ml/m  RA Volume:   19.40 ml  9.17 ml/m LA Vol (A4C):   36.0 ml 17.02 ml/m LA Biplane Vol: 34.3 ml 16.22 ml/m  AORTIC VALVE LVOT Vmax:   103.00 cm/s LVOT Vmean:  69.700 cm/s LVOT VTI:    0.192 m  AORTA Ao Root diam: 2.90 cm Ao Asc diam:  3.30 cm MITRAL VALVE MV Area (PHT): 4.49 cm    SHUNTS MV Decel Time: 169 msec    Systemic VTI:  0.19 m MV E velocity: 46.70 cm/s  Systemic Diam: 2.00 cm MV A velocity: 63.60 cm/s MV E/A ratio:  0.73 Weston Brass MD Electronically signed by Weston Brass MD Signature Date/Time: 08/08/2022/12:14:51 PM    Final    EEG adult  Result Date: 08/07/2022 Charlsie Quest, MD     08/07/2022  7:21 AM Patient Name: Connie Wilson MRN: [id number] Epilepsy Attending: Charlsie Quest Referring Physician/Provider: Lurline Del, MD Date: 08/07/2022 Duration: 21.32 mins Patient history: 66yo F with ams. EEG to evaluate for seizure Level of alertness: Awake, asleep AEDs during EEG study: None Technical aspects: This EEG study was done with scalp electrodes positioned according to the 10-20 International system of electrode placement. Electrical activity was reviewed with band pass filter of 1-70Hz , sensitivity of 7 uV/mm, display speed of 16mm/sec with a  notched filter applied as appropriate. EEG data were recorded continuously and digitally stored.  Video monitoring was available and reviewed as appropriate. Description: The posterior dominant rhythm consists of 6.5 Hz activity of moderate voltage (25-35 uV) seen predominantly in posterior head regions, symmetric and reactive to eye opening and eye closing. Sleep was  characterized by vertex waves, sleep spindles (12 to 14 Hz), maximal frontocentral region.  EEG showed continuous generalized 3 to 6 Hz theta-delta slowing. Hyperventilation and photic stimulation were not performed.   ABNORMALITY - Continuous slow, generalized IMPRESSION: This study is suggestive of moderate diffuse encephalopathy, nonspecific etiology. No seizures or epileptiform discharges were seen throughout the recording. Charlsie Quest   MR BRAIN WO CONTRAST  Result Date: 08/07/2022 CLINICAL DATA:  66 year old female found down.  TIA. EXAM: MRI HEAD WITHOUT CONTRAST TECHNIQUE: Multiplanar, multiecho pulse sequences of the brain and surrounding structures were obtained without intravenous contrast. COMPARISON:  Head CT yesterday. FINDINGS: Brain: Study is intermittently degraded by motion artifact despite repeated imaging attempts. Subtle diffusion abnormality along the periphery of confluent T2 and FLAIR hyperintensity in the right basal ganglia (series 12, image 53) isointense on ADC. Intrinsic T1 hyperintensity there (sagittal image 8) without convincing hemosiderin on T2* imaging. Also, some evidence of right midbrain Wallerian degeneration series 15, image 10. No other restricted diffusion. No midline shift, mass effect, evidence of mass lesion, extra-axial collection or acute intracranial hemorrhage. Cervicomedullary junction and pituitary are within normal limits. Symmetric bilateral mesial temporal lobe atrophy (series 19, image 15). No definite  cortical encephalomalacia. Generalized ventricular enlargement is nonspecific and there is some evidence of hyperdynamic flow at the cerebral aqueduct. No transependymal edema. Vascular: Major intracranial vascular flow voids are preserved. Skull and upper cervical spine: Negative visible cervical spine. Visualized bone marrow signal is within normal limits. Sinuses/Orbits: Negative. Other: Grossly normal visible internal auditory structures. Negative  visible scalp and face. IMPRESSION: 1. Intermittently motion degraded exam. 2. 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 But questionable faint diffusion restriction along the margins; difficult to exclude acute on chronic small vessel ischemia there. 3. No other acute infarct. Nonspecific ventriculomegaly which could be ex vacuo - and there is bilateral mesial temporal lobe atrophy - but difficult to exclude normal pressure hydrocephalus (NPH). Electronically Signed   By: Odessa FlemingH  Hall M.D.   On: 08/07/2022 07:14   CT ABDOMEN PELVIS W CONTRAST  Result Date: 08/07/2022 CLINICAL DATA:  Abdominal pain, acute, nonlocalized EXAM: CT ABDOMEN AND PELVIS WITH CONTRAST TECHNIQUE: Multidetector CT imaging of the abdomen and pelvis was performed using the standard protocol following bolus administration of intravenous contrast. RADIATION DOSE REDUCTION: This exam was performed according to the departmental dose-optimization program which includes automated exposure control, adjustment of the mA and/or kV according to patient size and/or use of iterative reconstruction technique. CONTRAST:  75mL OMNIPAQUE IOHEXOL 350 MG/ML SOLN COMPARISON:  10/21/2015 FINDINGS: Lower chest: No acute abnormality. Hepatobiliary: No focal hepatic abnormality. Gallbladder unremarkable. Pancreas: No focal abnormality or ductal dilatation. Spleen: No focal abnormality.  Normal size. Adrenals/Urinary Tract: No adrenal abnormality. No focal renal abnormality. No stones or hydronephrosis. Urinary bladder is unremarkable. Stomach/Bowel: Normal appendix. Stomach, large and small bowel grossly unremarkable. Vascular/Lymphatic: Aortic atherosclerosis. No evidence of aneurysm or adenopathy. Reproductive: Uterus and adnexa unremarkable.  No mass. Other: No free fluid or free air. Musculoskeletal: No acute bony abnormality. IMPRESSION: No acute findings in the abdomen or pelvis. Aortic  atherosclerosis. Electronically Signed   By: Charlett NoseKevin  Dover M.D.   On: 08/07/2022 03:32   DG Chest 2 View  Result Date: 08/06/2022 CLINICAL DATA:  Altered mental status, concern for sepsis or pneumonia. EXAM: CHEST - 2 VIEW COMPARISON:  11/10/2021. FINDINGS: The heart size and mediastinal contours are within normal limits. No consolidation, effusion, or pneumothorax. Degenerative changes in the thoracic spine. No acute abnormality. IMPRESSION: No active cardiopulmonary disease. Electronically Signed   By: Thornell SartoriusLaura  Taylor M.D.   On: 08/06/2022 23:11   CT Head Wo Contrast  Result Date: 08/06/2022 CLINICAL DATA:  Polytrauma, blunt.  Fall with mental status change. EXAM: CT HEAD WITHOUT CONTRAST CT CERVICAL SPINE WITHOUT CONTRAST TECHNIQUE: Multidetector CT imaging of the head and cervical spine was performed following the standard protocol without intravenous contrast. Multiplanar CT image reconstructions of the cervical spine were also generated. RADIATION DOSE REDUCTION: This exam was performed according to the departmental dose-optimization program which includes automated exposure control, adjustment of the mA and/or kV according to patient size and/or use of iterative reconstruction technique. COMPARISON:  01/12/2016. FINDINGS: CT HEAD FINDINGS Brain: No acute intracranial hemorrhage, midline shift or mass effect. No extra-axial fluid collection. Diffuse atrophy is noted. Periventricular white matter hypodensities are present bilaterally. There is ex vacuo dilatation of the ventricles with mild ventriculomegaly. Old lacunar infarcts are present in the basal ganglia. Vascular: No hyperdense vessel or unexpected calcification. Skull: Normal. Negative for fracture or focal lesion. Sinuses/Orbits: Mild mucosal thickening in the left maxillary sinus. No acute orbital abnormality. Other: None. CT CERVICAL SPINE FINDINGS  Alignment: Normal. Skull base and vertebrae: No acute fracture. No primary bone lesion or focal  pathologic process. Soft tissues and spinal canal: No prevertebral fluid or swelling. No visible canal hematoma. Disc levels: Multilevel intervertebral disc space narrowing, degenerative endplate changes, and facet arthropathy. Upper chest: No acute abnormality. Other: None. IMPRESSION: 1. No acute intracranial hemorrhage. 2. Atrophy with chronic microvascular ischemic changes. 3. Ex vacuo dilatation of the ventricles with mild ventriculomegaly, may be associated with normal pressure hydrocephalus. Comparison with older imaging studies or MRI is recommended for follow-up. 4. Degenerative changes in the cervical spine without evidence of acute fracture. Electronically Signed   By: Thornell Sartorius M.D.   On: 08/06/2022 21:49   CT Cervical Spine Wo Contrast  Result Date: 08/06/2022 CLINICAL DATA:  Polytrauma, blunt.  Fall with mental status change. EXAM: CT HEAD WITHOUT CONTRAST CT CERVICAL SPINE WITHOUT CONTRAST TECHNIQUE: Multidetector CT imaging of the head and cervical spine was performed following the standard protocol without intravenous contrast. Multiplanar CT image reconstructions of the cervical spine were also generated. RADIATION DOSE REDUCTION: This exam was performed according to the departmental dose-optimization program which includes automated exposure control, adjustment of the mA and/or kV according to patient size and/or use of iterative reconstruction technique. COMPARISON:  01/12/2016. FINDINGS: CT HEAD FINDINGS Brain: No acute intracranial hemorrhage, midline shift or mass effect. No extra-axial fluid collection. Diffuse atrophy is noted. Periventricular white matter hypodensities are present bilaterally. There is ex vacuo dilatation of the ventricles with mild ventriculomegaly. Old lacunar infarcts are present in the basal ganglia. Vascular: No hyperdense vessel or unexpected calcification. Skull: Normal. Negative for fracture or focal lesion. Sinuses/Orbits: Mild mucosal thickening in the  left maxillary sinus. No acute orbital abnormality. Other: None. CT CERVICAL SPINE FINDINGS Alignment: Normal. Skull base and vertebrae: No acute fracture. No primary bone lesion or focal pathologic process. Soft tissues and spinal canal: No prevertebral fluid or swelling. No visible canal hematoma. Disc levels: Multilevel intervertebral disc space narrowing, degenerative endplate changes, and facet arthropathy. Upper chest: No acute abnormality. Other: None. IMPRESSION: 1. No acute intracranial hemorrhage. 2. Atrophy with chronic microvascular ischemic changes. 3. Ex vacuo dilatation of the ventricles with mild ventriculomegaly, may be associated with normal pressure hydrocephalus. Comparison with older imaging studies or MRI is recommended for follow-up. 4. Degenerative changes in the cervical spine without evidence of acute fracture. Electronically Signed   By: Thornell Sartorius M.D.   On: 08/06/2022 21:49   (Echo, Carotid, EGD, Colonoscopy, ERCP)    Subjective: No complaints  Discharge Exam: Vitals:   08/16/22 0215 08/16/22 0409  BP: (!) 91/51 (!) 91/48  Pulse:    Resp: 14 13  Temp:    SpO2:     Vitals:   08/15/22 2205 08/16/22 0140 08/16/22 0215 08/16/22 0409  BP: (!) 85/55 (!) 86/51 (!) 91/51 (!) 91/48  Pulse:      Resp:  Temp:      TempSrc:      SpO2:      Weight:        General: Pt is alert, awake, not in acute distress Cardiovascular: RRR, S1/S2 +, no rubs, no gallops Respiratory: CTA bilaterally, no wheezing, no rhonchi Abdominal: Soft, NT, ND, bowel sounds + Extremities: no edema, no cyanosis    The results of significant diagnostics from this hospitalization (including imaging, microbiology, ancillary and laboratory) are listed below for reference.     Microbiology: Recent Results (from the past 240 hour(s))  SARS Coronavirus  2 by RT PCR (hospital order, performed in [facility] hospital lab) *cepheid single result test* Anterior Nasal Swab     Status: None    Collection Time: 08/06/22  8:58 PM   Specimen: Anterior Nasal Swab  Result Value Ref Range Status   SARS Coronavirus 2 by RT PCR NEGATIVE NEGATIVE Final    Comment: (NOTE) SARS-CoV-2 target nucleic acids are NOT DETECTED.  The SARS-CoV-2 RNA is generally detectable in upper and lower respiratory specimens during the acute phase of infection. The lowest concentration of SARS-CoV-2 viral copies this assay can detect is 250 copies / mL. A negative result does not preclude SARS-CoV-2 infection and should not be used as the sole basis for treatment or other patient management decisions.  A negative result may occur with improper specimen collection / handling, submission of specimen other than nasopharyngeal swab, presence of viral mutation(s) within the areas targeted by this assay, and inadequate number of viral copies (<250 copies / mL). A negative result must be combined with clinical observations, patient history, and epidemiological information.  Fact Sheet for Patients:   RoadLapTop.co.za  Fact Sheet for Healthcare Providers: http://kim-miller.com/  This test is not yet approved or  cleared by the [place] FDA and has been authorized for detection and/or diagnosis of SARS-CoV-2 by FDA under an Emergency Use Authorization (EUA).  This EUA will remain in effect (meaning this test can be used) for the duration of the COVID-19 declaration under Section 564(b)(1) of the Act, 21 U.S.C. section 360bbb-3(b)(1), unless the authorization is terminated or revoked sooner.  Performed at [facility], [address], [place], [place] [address]   Blood culture (routine x 2)     Status: None   Collection Time: 08/06/22  9:55 PM   Specimen: BLOOD  Result Value Ref Range Status   Specimen Description BLOOD LEFT ANTECUBITAL  Final   Special Requests   Final    BOTTLES DRAWN AEROBIC AND ANAEROBIC Blood Culture results may not be  optimal due to an inadequate volume of blood received in culture bottles   Culture   Final    NO GROWTH 5 DAYS Performed at [facility], [address] [place], [place] [address]    Report Status 08/11/2022 FINAL  Final  Blood culture (routine x 2)     Status: None (Preliminary result)   Collection Time: 08/06/22 10:01 PM   Specimen: BLOOD RIGHT HAND  Result Value Ref Range Status   Specimen Description BLOOD RIGHT HAND  Final   Special Requests   Final    BOTTLES DRAWN AEROBIC AND ANAEROBIC Blood Culture adequate volume   Culture  Setup Time   Final    GRAM POSITIVE RODS AEROBIC BOTTLE ONLY CRITICAL RESULT CALLED TO, READ BACK BY AND VERIFIED WITH: T [email or web address]  08/09/22 MK    Culture   Final    GRAM POSITIVE RODS Sent to Reference Laboratory for identification and speciation. Performed at [facility], [address] [place], [place] [address]    Report Status PENDING  Incomplete  Urine Culture     Status: Abnormal   Collection Time: 08/06/22 11:47 PM   Specimen: Urine, Clean Catch  Result Value Ref Range Status   Specimen Description URINE, CLEAN CATCH  Final   Special Requests   Final    NONE Performed at [facility], [address] [place], [place] [address]    Culture MULTIPLE SPECIES PRESENT, SUGGEST RECOLLECTION (A)  Final  Report Status 08/08/2022 FINAL  Final  Aerobic/Anaerobic Culture w Gram Stain (surgical/deep wound)     Status: None   Collection Time: 08/09/22 12:10 PM   Specimen: Other Source; Body Fluid  Result Value Ref Range Status   Specimen Description WOUND  Final   Special Requests  GLUTEAL  Final   Gram Stain   Final    RARE WBC PRESENT,BOTH PMN AND MONONUCLEAR RARE GRAM NEGATIVE RODS    Culture   Final    FEW PROTEUS VULGARIS NO ANAEROBES ISOLATED Performed at [facility], [address] [place], [place] [address]    Report Status 08/14/2022 FINAL  Final   Organism ID, Bacteria PROTEUS VULGARIS  Final       Susceptibility   Proteus vulgaris - MIC*    AMPICILLIN >=32 RESISTANT Resistant     CEFAZOLIN >=64 RESISTANT Resistant     CEFEPIME <=0.12 SENSITIVE Sensitive     CEFTAZIDIME <=1 SENSITIVE Sensitive     CIPROFLOXACIN <=0.25 SENSITIVE Sensitive     GENTAMICIN <=1 SENSITIVE Sensitive     IMIPENEM 8 INTERMEDIATE Intermediate     TRIMETH/SULFA <=20 SENSITIVE Sensitive     AMPICILLIN/SULBACTAM >=32 RESISTANT Resistant     PIP/TAZO <=4 SENSITIVE Sensitive     * FEW PROTEUS VULGARIS     Labs: BNP (last 3 results) Recent Labs    11/10/21 1144  BNP 150.7*   Basic Metabolic Panel: Recent Labs  Lab 08/10/22 0453 08/11/22 0347 08/14/22 0912  NA 137 138 131*  K 3.4* 3.6 3.9  CL 103 106 99  CO2 20* 25 23  GLUCOSE 156* 116* 162*  BUN 11 12 16   CREATININE 0.69 0.76 0.83  CALCIUM 8.5* 8.0* 8.6*  MG 1.8 1.7 1.8   Liver Function Tests: Recent Labs  Lab 08/10/22 0453  AST 23  ALT 25  ALKPHOS 66  BILITOT 0.3  PROT 5.3*  ALBUMIN 1.9*   No results for input(s): "LIPASE", "AMYLASE" in the last 168 hours. No results for input(s): "AMMONIA" in the last 168 hours. CBC: Recent Labs  Lab 08/10/22 0453 08/10/22 2100  WBC 12.9* 11.1*  NEUTROABS  --  7.6  HGB 10.9* 10.8*  HCT 32.2* 31.6*  MCV 83.6 84.0  PLT 194 194   Cardiac Enzymes: No results for input(s): "CKTOTAL", "CKMB", "CKMBINDEX", "TROPONINI" in the last 168 hours. BNP: Invalid input(s): "POCBNP" CBG: Recent Labs  Lab 08/14/22 2038 08/15/22 0802 08/15/22 1120 08/15/22 1604 08/15/22 2147  GLUCAP 126* 139* 132* 140* 135*   D-Dimer No results for input(s): "DDIMER" in the last 72 hours. Hgb A1c No results for input(s): "HGBA1C" in the last 72 hours. Lipid Profile No results for input(s): "CHOL", "HDL", "LDLCALC", "TRIG", "CHOLHDL", "LDLDIRECT" in the last 72 hours. Thyroid function studies No results for input(s): "TSH", "T4TOTAL", "T3FREE", "THYROIDAB" in the last 72 hours.  Invalid input(s):  "FREET3" Anemia work up No results for input(s): "VITAMINB12", "FOLATE", "FERRITIN", "TIBC", "IRON", "RETICCTPCT" in the last 72 hours. Urinalysis    Component Value Date/Time   COLORURINE YELLOW 08/06/2022 2347   APPEARANCEUR TURBID (A) 08/06/2022 2347   LABSPEC 1.027 08/06/2022 2347   PHURINE 5.0 08/06/2022 2347   GLUCOSEU NEGATIVE 08/06/2022 2347   HGBUR NEGATIVE 08/06/2022 2347   BILIRUBINUR NEGATIVE 08/06/2022 2347   BILIRUBINUR negative 12/06/2015 0848   BILIRUBINUR neg 02/23/2013 0904   KETONESUR 20 (A) 08/06/2022 2347   PROTEINUR NEGATIVE 08/06/2022 2347   UROBILINOGEN 0.2 12/06/2015 0848   UROBILINOGEN 0.2 12/25/2009 1310  NITRITE NEGATIVE 08/06/2022 2347   LEUKOCYTESUR NEGATIVE 08/06/2022 2347   Sepsis Labs Recent Labs  Lab 08/10/22 0453 08/10/22 2100  WBC 12.9* 11.1*   Microbiology Recent Results (from the past 240 hour(s))  SARS Coronavirus 2 by RT PCR (hospital order, performed in [facility] hospital lab) *cepheid single result test* Anterior Nasal Swab     Status: None   Collection Time: 08/06/22  8:58 PM   Specimen: Anterior Nasal Swab  Result Value Ref Range Status   SARS Coronavirus 2 by RT PCR NEGATIVE NEGATIVE Final    Comment: (NOTE) SARS-CoV-2 target nucleic acids are NOT DETECTED.  The SARS-CoV-2 RNA is generally detectable in upper and lower respiratory specimens during the acute phase of infection. The lowest concentration of SARS-CoV-2 viral copies this assay can detect is 250 copies / mL. A negative result does not preclude SARS-CoV-2 infection and should not be used as the sole basis for treatment or other patient management decisions.  A negative result may occur with improper specimen collection / handling, submission of specimen other than nasopharyngeal swab, presence of viral mutation(s) within the areas targeted by this assay, and inadequate number of viral copies (<250 copies / mL). A negative result must be combined with  clinical observations, patient history, and epidemiological information.  Fact Sheet for Patients:   RoadLapTop.co.za  Fact Sheet for Healthcare Providers: http://kim-miller.com/  This test is not yet approved or  cleared by the [place] FDA and has been authorized for detection and/or diagnosis of SARS-CoV-2 by FDA under an Emergency Use Authorization (EUA).  This EUA will remain in effect (meaning this test can be used) for the duration of the COVID-19 declaration under Section 564(b)(1) of the Act, 21 U.S.C. section 360bbb-3(b)(1), unless the authorization is terminated or revoked sooner.  Performed at [facility], [address], [place], [place] [address]   Blood culture (routine x 2)     Status: None   Collection Time: 08/06/22  9:55 PM   Specimen: BLOOD  Result Value Ref Range Status   Specimen Description BLOOD LEFT ANTECUBITAL  Final   Special Requests   Final    BOTTLES DRAWN AEROBIC AND ANAEROBIC Blood Culture results may not be optimal due to an inadequate volume of blood received in culture bottles   Culture   Final    NO GROWTH 5 DAYS Performed at [facility], [address] [place], [place] [address]    Report Status 08/11/2022 FINAL  Final  Blood culture (routine x 2)     Status: None (Preliminary result)   Collection Time: 08/06/22 10:01 PM   Specimen: BLOOD RIGHT HAND  Result Value Ref Range Status   Specimen Description BLOOD RIGHT HAND  Final   Special Requests   Final    BOTTLES DRAWN AEROBIC AND ANAEROBIC Blood Culture adequate volume   Culture  Setup Time   Final    GRAM POSITIVE RODS AEROBIC BOTTLE ONLY CRITICAL RESULT CALLED TO, READ BACK BY AND VERIFIED WITH: T RUDISILL,[email or web address]  08/09/22 MK    Culture   Final    GRAM POSITIVE RODS Sent to Reference Laboratory for identification and speciation. Performed at [facility], [address] [place], [place] [address]     Report Status PENDING  Incomplete  Urine Culture     Status: Abnormal   Collection Time: 08/06/22 11:47 PM   Specimen: Urine, Clean Catch  Result Value Ref Range Status   Specimen Description URINE, CLEAN CATCH  Final   Special Requests   Final    NONE Performed at [facility], [address], [place], [place] [address]    Culture MULTIPLE SPECIES PRESENT, SUGGEST RECOLLECTION (A)  Final   Report Status 08/08/2022 FINAL  Final  Aerobic/Anaerobic Culture w Gram Stain (surgical/deep wound)     Status: None   Collection Time: 08/09/22 12:10 PM   Specimen: Other Source; Body Fluid  Result Value Ref Range Status   Specimen Description WOUND  Final   Special Requests  GLUTEAL  Final   Gram Stain   Final    RARE WBC PRESENT,BOTH PMN AND MONONUCLEAR RARE GRAM NEGATIVE RODS    Culture   Final    FEW PROTEUS VULGARIS NO ANAEROBES ISOLATED Performed at [facility], [address], [place], [place] [address]    Report Status 08/14/2022 FINAL  Final   Organism ID, Bacteria PROTEUS VULGARIS  Final      Susceptibility   Proteus vulgaris - MIC*    AMPICILLIN >=32 RESISTANT Resistant     CEFAZOLIN >=64 RESISTANT Resistant     CEFEPIME <=0.12 SENSITIVE Sensitive     CEFTAZIDIME <=1 SENSITIVE Sensitive     CIPROFLOXACIN <=0.25 SENSITIVE Sensitive     GENTAMICIN <=1 SENSITIVE Sensitive     IMIPENEM 8 INTERMEDIATE Intermediate     TRIMETH/SULFA <=20 SENSITIVE Sensitive     AMPICILLIN/SULBACTAM >=32 RESISTANT Resistant     PIP/TAZO <=4 SENSITIVE Sensitive     * FEW PROTEUS VULGARIS    SIGNED:   Marinda Elk, MD  [facility] 08/16/2022, 7:42 AM Pager   If 7PM-7AM, please contact night-coverage [email or web address] Password [email or web address]

## 2022-08-16 NOTE — Progress Notes (Signed)
Notified MD of B/P 78/46, new order for bolus

## 2022-08-16 NOTE — Progress Notes (Addendum)
Notified MD B/P 88/54 asymptotic, no new orders at this time

## 2022-08-17 LAB — BASIC METABOLIC PANEL
Anion gap: 5 (ref 5–15)
BUN: 34 mg/dL — ABNORMAL HIGH (ref 8–23)
CO2: 23 mmol/L (ref 22–32)
Calcium: 8.7 mg/dL — ABNORMAL LOW (ref 8.9–10.3)
Chloride: 105 mmol/L (ref 98–111)
Creatinine, Ser: 1.36 mg/dL — ABNORMAL HIGH (ref 0.44–1.00)
GFR, Estimated: 43 mL/min — ABNORMAL LOW (ref >60)
Glucose, Bld: 95 mg/dL (ref 70–99)
Potassium: 4.5 mmol/L (ref 3.5–5.1)
Sodium: 133 mmol/L — ABNORMAL LOW (ref 135–145)

## 2022-08-17 LAB — GLUCOSE, CAPILLARY
Glucose-Capillary: 92 mg/dL (ref 70–99)
Glucose-Capillary: 92 mg/dL (ref 70–99)

## 2022-08-17 LAB — ORGANISM ID, BACTERIA

## 2022-08-17 LAB — BACTERIAL ORGANISM REFLEX

## 2022-08-17 MED ORDER — SODIUM CHLORIDE 0.9 % IV BOLUS
500.0000 mL | Freq: Once | INTRAVENOUS | Status: AC
  Administered 2022-08-17: 500 mL via INTRAVENOUS

## 2022-08-17 NOTE — TOC Transition Note (Signed)
Transition of Care [facility]) - CM/SW Discharge Note   Patient Details  Name: Thressa Shiffer MRN: [id number] Date of Birth: Apr 19, 1956  [facility] CM/SW Contact:  Lorri Frederick, LCSW Phone Number: 08/17/2022, 10:22 AM   Clinical Narrative:  Pt discharging to [facility].  RN call report to [phone] for report.      Final next level of care: Skilled Nursing Facility Barriers to Discharge: Barriers Resolved   Patient Goals and CMS Choice   CMS Medicare.gov Compare Post Acute Care list provided to:: Patient Represenative (must comment) (daughter April) Choice offered to / list presented to : Adult Children  Discharge Placement              Patient chooses bed at: [facility] Patient to be transferred to facility by: [facility] Name of family member notified: daughter April Patient and family notified of of transfer: 08/17/22  Discharge Plan and Services In-house Referral: Clinical Social Work   Post Acute Care Choice: Skilled Nursing Facility                               Social Determinants of Health (SDOH) Interventions     Readmission Risk Interventions     No data to display

## 2022-08-17 NOTE — Progress Notes (Signed)
Report called and given to RN at [facility]

## 2022-08-17 NOTE — Discharge Summary (Signed)
Physician Discharge Summary  Connie Wilson [id number] DOB: 1956-08-30 DOA: 08/06/2022  PCP: Milford Cage, PA  Admit date: 08/06/2022 Discharge date: 08/17/2022  Admitted From: Home Disposition:  SNF  Recommendations for Outpatient Follow-up:  Follow up with PCP in 1-2 weeks Please obtain BMP/CBC in one week   Home Health:No Equipment/Devices:None  Discharge Condition:Stable CODE STATUS:Full Diet recommendation: Heart Healthy  Brief/Interim Summary: 66 y.o. female past medical history of diabetes mellitus type 2, essential hypertension TIA recurrent UTIs who presents to the ED after being found down at home she lives alone last time she was seen was about a week ago upon EMS arrival they found her confused soiled in feces and urine. Medically stable for transfer to skilled nursing facility  Discharge Diagnoses:  Principal Problem:   Sepsis ([place]) Active Problems:   HTN (hypertension)   Hyperlipidemia   Acute metabolic encephalopathy   Pressure injury of skin   Controlled type 2 diabetes mellitus without complication, without long-term current use of insulin (HCC)  Sepsis due to sacral decubitus ulcer stage IV Question secondary cubitus ulcer versus UTI. Order for blood cultures were positive for gram-positive rods likely contaminant. She remained afebrile, she was transition to oral Bactrim should continue as an outpatient. Physical therapy evaluated the patient recommended to go home with skilled nursing facility.  Acute metabolic encephalopathy: Likely due to #1 after antibiotics and IV fluids encephalopathy resolved. There is likely due to infectious etiology.  History of CVA: Incidental on MRI neurology recommended no further imaging. She was continue on aspirin and statins.  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 General surgery was consulted she was taken to the OR for I&D. Culture grew Proteus vulgaris sensitive to Bactrim she will continue Bactrim for at least 2 weeks.  Controlled diabetes mellitus type 2: With an A1c of 6.6 continue current regimen no changes made.  Essential hypertension: Blood pressure was slightly elevated on lisinopril and clonidine, she was started on low-dose hydrochlorothiazide. PCP to check basic metabolic panel in 1 week.  Depression: Continue Celexa.  Hyperkalemia: Resolved.    Discharge Instructions  Discharge Instructions     Diet - low sodium heart healthy   Complete by: As directed    Diet - low sodium heart healthy   Complete by: As directed    Discharge wound care:   Complete by: As directed    Per wound care nurse instructions   Discharge wound care:   Complete by: As directed    Per surgery instructions   Increase activity slowly   Complete by: As directed    Increase activity slowly   Complete by: As directed       Allergies as of 08/17/2022       Reactions   Clindamycin/lincomycin Other (See Comments)   Pt report C-diff   Penicillins Rash   Tolerated zosyn 08/08/2022 Has patient had a PCN reaction causing immediate rash, facial/tongue/throat swelling, SOB or lightheadedness with hypotension: No (pt  Did have rash) Has patient had a PCN reaction causing severe rash involving mucus membranes or skin necrosis: No Has patient had a PCN reaction that required hospitalization: No Has patient had a PCN reaction occurring within the last 10 years:No If all of the above answers are "NO", then may proceed with Cephalosporin  use.        Medication List     STOP taking these medications    cloNIDine 0.2 MG tablet Commonly known as: CATAPRES       TAKE these medications    aspirin 81 MG chewable tablet Chew 1 tablet (81 mg total) by mouth daily.   calcium citrate 950  (200 Ca) MG tablet Commonly known as: CALCITRATE - dosed in mg elemental calcium Take 1 tablet by mouth 2 (two) times daily.   citalopram 20 MG tablet Commonly known as: CELEXA Take 1 tablet (20 mg total) by mouth daily. What changed: how much to take   Fish Oil 1000 MG Caps Take by mouth.   hydrochlorothiazide 12.5 MG tablet Commonly known as: HYDRODIURIL Take 1 tablet (12.5 mg total) by mouth daily.   lisinopril 20 MG tablet Commonly known as: ZESTRIL Take 1 tablet (20 mg total) by mouth daily. Start taking on: August 24, 2022 What changed: These instructions start on August 24, 2022. If you are unsure what to do until then, ask your doctor or other care provider.   metFORMIN 500 MG tablet Commonly known as: GLUCOPHAGE Take 500 mg by mouth daily with breakfast.   multivitamin tablet Take 1 tablet by mouth daily.   OVER THE COUNTER MEDICATION Vitamin D 3 one capsule daily.   pantoprazole 20 MG tablet Commonly known as: PROTONIX Take 20 mg by mouth daily.   sulfamethoxazole-trimethoprim 800-160 MG tablet Commonly known as: BACTRIM DS Take 1 tablet by mouth every 12 (twelve) hours for 14 days.   vitamin C 1000 MG tablet Take 1,000 mg by mouth daily.               Discharge Care Instructions  (From admission, onward)           Start     Ordered   08/17/22 0000  Discharge wound care:       Comments: Per surgery instructions   08/17/22 0743   08/16/22 0000  Discharge wound care:       Comments: Per wound care nurse instructions   08/16/22 0742            Follow-up Information     [facility], [facility] Follow up on 09/06/2022.   Specialty: General Surgery Why: 09/06/22 at 9 am. Please bring a copy of your photo ID and insurance card. Please arrive 30 minutes prior to your appointment for paperwork. Contact information: [address]  [place] [id number] [phone]                Allergies  Allergen Reactions    Clindamycin/Lincomycin Other (See Comments)    Pt report C-diff   Penicillins Rash    Tolerated zosyn 08/08/2022 Has patient had a PCN reaction causing immediate rash, facial/tongue/throat swelling, SOB or lightheadedness with hypotension: No (pt  Did have rash) Has patient had a PCN reaction causing severe rash involving mucus membranes or skin necrosis: No Has patient had a PCN reaction that required hospitalization: No Has patient had a PCN reaction occurring within the last 10 years:No If all of the above answers are "NO", then may proceed with Cephalosporin use.     Consultations: General surgery   Procedures/Studies: ECHOCARDIOGRAM COMPLETE  Result Date: 08/08/2022    ECHOCARDIOGRAM REPORT   Patient Name:   KARMANN BLAUSTEIN Date of Exam: 08/08/2022 Medical Rec #:  [id number]       Height:       68.0 in Accession #:  [id number]      Weight:       216.8 lb Date of Birth:  18-Dec-1955       BSA:          2.115 m Patient Age:    40 years        BP:           125/73 mmHg Patient Gender: F               HR:           108 bpm. Exam Location:  Inpatient Procedure: 2D Echo Indications:    syncope  History:        Patient has prior history of Echocardiogram examinations, most                 recent 11/30/2021. Risk Factors:Diabetes and Hypertension.  Sonographer:    [place] Referring Phys: [id number] SARA-MAIZ A THOMAS  Sonographer Comments: Image acquisition challenging due to patient body habitus. IMPRESSIONS  1. Left ventricular ejection fraction, by estimation, is 55%. The left ventricle has normal function. Left ventricular endocardial border not optimally defined to evaluate regional wall motion. Left ventricular diastolic parameters are consistent with Grade I diastolic dysfunction (impaired relaxation). There is abnormal septal motion secondary to conduction delay.  2. Right ventricular systolic function is mildly reduced particularly at the RV apex. The right ventricular size is  normal. Tricuspid regurgitation signal is inadequate for assessing PA pressure.  3. The mitral valve is normal in structure. Trivial mitral valve regurgitation. No evidence of mitral stenosis.  4. The aortic valve is tricuspid. Aortic valve regurgitation is not visualized. No aortic stenosis is present.  5. The inferior vena cava is normal in size with greater than 50% respiratory variability, suggesting right atrial pressure of 3 mmHg. FINDINGS  Left Ventricle: Left ventricular ejection fraction, by estimation, is 55%. The left ventricle has normal function. Left ventricular endocardial border not optimally defined to evaluate regional wall motion. The left ventricular internal cavity size was normal in size. There is no left ventricular hypertrophy. Abnormal septal motion secondary to conduction delay. Left ventricular diastolic parameters are consistent with Grade I diastolic dysfunction (impaired relaxation). Right Ventricle: The right ventricular size is normal. No increase in right ventricular wall thickness. Right ventricular systolic function is mildly reduced. Tricuspid regurgitation signal is inadequate for assessing PA pressure. Left Atrium: Left atrial size was normal in size. Right Atrium: Right atrial size was normal in size. Pericardium: There is no evidence of pericardial effusion. Mitral Valve: The mitral valve is normal in structure. Trivial mitral valve regurgitation. No evidence of mitral valve stenosis. Tricuspid Valve: The tricuspid valve is normal in structure. Tricuspid valve regurgitation is trivial. No evidence of tricuspid stenosis. Aortic Valve: The aortic valve is tricuspid. Aortic valve regurgitation is not visualized. No aortic stenosis is present. Pulmonic Valve: The pulmonic valve was normal in structure. Pulmonic valve regurgitation is not visualized. No evidence of pulmonic stenosis. Aorta: The aortic root is normal in size and structure. Venous: The inferior vena cava is normal in  size with greater than 50% respiratory variability, suggesting right atrial pressure of 3 mmHg. IAS/Shunts: No atrial level shunt detected by color flow Doppler.  LEFT VENTRICLE PLAX 2D LVIDd:         4.50 cm     Diastology LVIDs:         3.20 cm     LV e' medial:    6.53 cm/s LV PW:  0.80 cm     LV E/e' medial:  7.2 LV IVS:        0.90 cm     LV e' lateral:   7.51 cm/s LVOT diam:     2.00 cm     LV E/e' lateral: 6.2 LV SV:         60 LV SV Index:   29 LVOT Area:     3.14 cm  LV Volumes (MOD) LV vol d, MOD A2C: 60.2 ml LV vol s, MOD A2C: 29.2 ml LV SV MOD A2C:     31.0 ml RIGHT VENTRICLE             IVC RV S prime:     11.30 cm/s  IVC diam: 1.40 cm TAPSE (M-mode): 1.2 cm LEFT ATRIUM             Index        RIGHT ATRIUM           Index LA diam:        3.20 cm 1.51 cm/m   RA Area:     10.10 cm LA Vol (A2C):   30.9 ml 14.61 ml/m  RA Volume:   19.40 ml  9.17 ml/m LA Vol (A4C):   36.0 ml 17.02 ml/m LA Biplane Vol: 34.3 ml 16.22 ml/m  AORTIC VALVE LVOT Vmax:   103.00 cm/s LVOT Vmean:  69.700 cm/s LVOT VTI:    0.192 m  AORTA Ao Root diam: 2.90 cm Ao Asc diam:  3.30 cm MITRAL VALVE MV Area (PHT): 4.49 cm    SHUNTS MV Decel Time: 169 msec    Systemic VTI:  0.19 m MV E velocity: 46.70 cm/s  Systemic Diam: 2.00 cm MV A velocity: 63.60 cm/s MV E/A ratio:  0.73 Connie Kaiser MD Electronically signed by Connie Kaiser MD Signature Date/Time: 08/08/2022/12:14:51 PM    Final    EEG adult  Result Date: 08/07/2022 Connie Havens, MD     08/07/2022  7:21 AM Patient Name: Connie Wilson MRN: [id number] Epilepsy Attending: Lora Wilson Referring Physician/Provider: Clance Boll, MD Date: 08/07/2022 Duration: 21.32 mins Patient history: 66yo F with ams. EEG to evaluate for seizure Level of alertness: Awake, asleep AEDs during EEG study: None Technical aspects: This EEG study was done with scalp electrodes positioned according to the 10-20 International system of electrode placement. Electrical  activity was reviewed with band pass filter of 1-70Hz , sensitivity of 7 uV/mm, display speed of 41mm/sec with a 60Hz  notched filter applied as appropriate. EEG data were recorded continuously and digitally stored.  Video monitoring was available and reviewed as appropriate. Description: The posterior dominant rhythm consists of 6.5 Hz activity of moderate voltage (25-35 uV) seen predominantly in posterior head regions, symmetric and reactive to eye opening and eye closing. Sleep was characterized by vertex waves, sleep spindles (12 to 14 Hz), maximal frontocentral region.  EEG showed continuous generalized 3 to 6 Hz theta-delta slowing. Hyperventilation and photic stimulation were not performed.   ABNORMALITY - Continuous slow, generalized IMPRESSION: This study is suggestive of moderate diffuse encephalopathy, nonspecific etiology. No seizures or epileptiform discharges were seen throughout the recording. Connie Wilson   MR BRAIN WO CONTRAST  Result Date: 08/07/2022 CLINICAL DATA:  66 year old female found down.  TIA. EXAM: MRI HEAD WITHOUT CONTRAST TECHNIQUE: Multiplanar, multiecho pulse sequences of the brain and surrounding structures were obtained without intravenous contrast. COMPARISON:  Head CT yesterday. FINDINGS: Brain: Study is intermittently degraded by motion artifact despite  repeated imaging attempts. Subtle diffusion abnormality along the periphery of confluent T2 and FLAIR hyperintensity in the right basal ganglia (series 12, image 53) isointense on ADC. Intrinsic T1 hyperintensity there (sagittal image 8) without convincing hemosiderin on T2* imaging. Also, some evidence of right midbrain Wallerian degeneration series 15, image 10. No other restricted diffusion. No midline shift, mass effect, evidence of mass lesion, extra-axial collection or acute intracranial hemorrhage. Cervicomedullary junction and pituitary are within normal limits. Symmetric bilateral mesial temporal lobe atrophy  (series 19, image 15). No definite cortical encephalomalacia. Generalized ventricular enlargement is nonspecific and there is some evidence of hyperdynamic flow at the cerebral aqueduct. No transependymal edema. Vascular: Major intracranial vascular flow voids are preserved. Skull and upper cervical spine: Negative visible cervical spine. Visualized bone marrow signal is within normal limits. Sinuses/Orbits: Negative. Other: Grossly normal visible internal auditory structures. Negative visible scalp and face. IMPRESSION: 1. Intermittently motion degraded exam. 2. 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 But questionable faint diffusion restriction along the margins; difficult to exclude acute on chronic small vessel ischemia there. 3. No other acute infarct. Nonspecific ventriculomegaly which could be ex vacuo - and there is bilateral mesial temporal lobe atrophy - but difficult to exclude normal pressure hydrocephalus (NPH). Electronically Signed   By: Odessa Fleming M.D.   On: 08/07/2022 07:14   CT ABDOMEN PELVIS W CONTRAST  Result Date: 08/07/2022 CLINICAL DATA:  Abdominal pain, acute, nonlocalized EXAM: CT ABDOMEN AND PELVIS WITH CONTRAST TECHNIQUE: Multidetector CT imaging of the abdomen and pelvis was performed using the standard protocol following bolus administration of intravenous contrast. RADIATION DOSE REDUCTION: This exam was performed according to the departmental dose-optimization program which includes automated exposure control, adjustment of the mA and/or kV according to patient size and/or use of iterative reconstruction technique. CONTRAST:  48mL OMNIPAQUE IOHEXOL 350 MG/ML SOLN COMPARISON:  10/21/2015 FINDINGS: Lower chest: No acute abnormality. Hepatobiliary: No focal hepatic abnormality. Gallbladder unremarkable. Pancreas: No focal abnormality or ductal dilatation. Spleen: No focal abnormality.  Normal size. Adrenals/Urinary  Tract: No adrenal abnormality. No focal renal abnormality. No stones or hydronephrosis. Urinary bladder is unremarkable. Stomach/Bowel: Normal appendix. Stomach, large and small bowel grossly unremarkable. Vascular/Lymphatic: Aortic atherosclerosis. No evidence of aneurysm or adenopathy. Reproductive: Uterus and adnexa unremarkable.  No mass. Other: No free fluid or free air. Musculoskeletal: No acute bony abnormality. IMPRESSION: No acute findings in the abdomen or pelvis. Aortic atherosclerosis. Electronically Signed   By: Charlett Nose M.D.   On: 08/07/2022 03:32   DG Chest 2 View  Result Date: 08/06/2022 CLINICAL DATA:  Altered mental status, concern for sepsis or pneumonia. EXAM: CHEST - 2 VIEW COMPARISON:  11/10/2021. FINDINGS: The heart size and mediastinal contours are within normal limits. No consolidation, effusion, or pneumothorax. Degenerative changes in the thoracic spine. No acute abnormality. IMPRESSION: No active cardiopulmonary disease. Electronically Signed   By: Thornell Sartorius M.D.   On: 08/06/2022 23:11   CT Head Wo Contrast  Result Date: 08/06/2022 CLINICAL DATA:  Polytrauma, blunt.  Fall with mental status change. EXAM: CT HEAD WITHOUT CONTRAST CT CERVICAL SPINE WITHOUT CONTRAST TECHNIQUE: Multidetector CT imaging of the head and cervical spine was performed following the standard protocol without intravenous contrast. Multiplanar CT image reconstructions of the cervical spine were also generated. RADIATION DOSE REDUCTION: This exam was performed according to the departmental dose-optimization program which includes automated exposure control, adjustment of the mA and/or kV according to patient size and/or  use of iterative reconstruction technique. COMPARISON:  01/12/2016. FINDINGS: CT HEAD FINDINGS Brain: No acute intracranial hemorrhage, midline shift or mass effect. No extra-axial fluid collection. Diffuse atrophy is noted. Periventricular white matter hypodensities are present  bilaterally. There is ex vacuo dilatation of the ventricles with mild ventriculomegaly. Old lacunar infarcts are present in the basal ganglia. Vascular: No hyperdense vessel or unexpected calcification. Skull: Normal. Negative for fracture or focal lesion. Sinuses/Orbits: Mild mucosal thickening in the left maxillary sinus. No acute orbital abnormality. Other: None. CT CERVICAL SPINE FINDINGS Alignment: Normal. Skull base and vertebrae: No acute fracture. No primary bone lesion or focal pathologic process. Soft tissues and spinal canal: No prevertebral fluid or swelling. No visible canal hematoma. Disc levels: Multilevel intervertebral disc space narrowing, degenerative endplate changes, and facet arthropathy. Upper chest: No acute abnormality. Other: None. IMPRESSION: 1. No acute intracranial hemorrhage. 2. Atrophy with chronic microvascular ischemic changes. 3. Ex vacuo dilatation of the ventricles with mild ventriculomegaly, may be associated with normal pressure hydrocephalus. Comparison with older imaging studies or MRI is recommended for follow-up. 4. Degenerative changes in the cervical spine without evidence of acute fracture. Electronically Signed   By: Brett Fairy M.D.   On: 08/06/2022 21:49   CT Cervical Spine Wo Contrast  Result Date: 08/06/2022 CLINICAL DATA:  Polytrauma, blunt.  Fall with mental status change. EXAM: CT HEAD WITHOUT CONTRAST CT CERVICAL SPINE WITHOUT CONTRAST TECHNIQUE: Multidetector CT imaging of the head and cervical spine was performed following the standard protocol without intravenous contrast. Multiplanar CT image reconstructions of the cervical spine were also generated. RADIATION DOSE REDUCTION: This exam was performed according to the departmental dose-optimization program which includes automated exposure control, adjustment of the mA and/or kV according to patient size and/or use of iterative reconstruction technique. COMPARISON:  01/12/2016. FINDINGS: CT HEAD FINDINGS  Brain: No acute intracranial hemorrhage, midline shift or mass effect. No extra-axial fluid collection. Diffuse atrophy is noted. Periventricular white matter hypodensities are present bilaterally. There is ex vacuo dilatation of the ventricles with mild ventriculomegaly. Old lacunar infarcts are present in the basal ganglia. Vascular: No hyperdense vessel or unexpected calcification. Skull: Normal. Negative for fracture or focal lesion. Sinuses/Orbits: Mild mucosal thickening in the left maxillary sinus. No acute orbital abnormality. Other: None. CT CERVICAL SPINE FINDINGS Alignment: Normal. Skull base and vertebrae: No acute fracture. No primary bone lesion or focal pathologic process. Soft tissues and spinal canal: No prevertebral fluid or swelling. No visible canal hematoma. Disc levels: Multilevel intervertebral disc space narrowing, degenerative endplate changes, and facet arthropathy. Upper chest: No acute abnormality. Other: None. IMPRESSION: 1. No acute intracranial hemorrhage. 2. Atrophy with chronic microvascular ischemic changes. 3. Ex vacuo dilatation of the ventricles with mild ventriculomegaly, may be associated with normal pressure hydrocephalus. Comparison with older imaging studies or MRI is recommended for follow-up. 4. Degenerative changes in the cervical spine without evidence of acute fracture. Electronically Signed   By: Brett Fairy M.D.   On: 08/06/2022 21:49   (Echo, Carotid, EGD, Colonoscopy, ERCP)    Subjective: No complaints  Discharge Exam: Vitals:   08/17/22 0505 08/17/22 0700  BP: (!) 96/37 93/72  Pulse:    Resp: 16   Temp:  98 F (36.7 C)  SpO2:  98%   Vitals:   08/16/22 2110 08/17/22 0010 08/17/22 0505 08/17/22 0700  BP:  110/63 (!) 96/37 93/72  Pulse:      Resp:  14 16   Temp: 99.1 F (37.3 C)   98 F (  36.7 C)  TempSrc:    Oral  SpO2:    98%  Weight:        General: Pt is alert, awake, not in acute distress Cardiovascular: RRR, S1/S2 +, no rubs, no  gallops Respiratory: CTA bilaterally, no wheezing, no rhonchi Abdominal: Soft, NT, ND, bowel sounds + Extremities: no edema, no cyanosis    The results of significant diagnostics from this hospitalization (including imaging, microbiology, ancillary and laboratory) are listed below for reference.     Microbiology: Recent Results (from the past 240 hour(s))  Aerobic/Anaerobic Culture w Gram Stain (surgical/deep wound)     Status: None   Collection Time: 08/09/22 12:10 PM   Specimen: Other Source; Body Fluid  Result Value Ref Range Status   Specimen Description WOUND  Final   Special Requests  GLUTEAL  Final   Gram Stain   Final    RARE WBC PRESENT,BOTH PMN AND MONONUCLEAR RARE GRAM NEGATIVE RODS    Culture   Final    FEW PROTEUS VULGARIS NO ANAEROBES ISOLATED Performed at [facility], [address], [place], [place] [address]    Report Status 08/14/2022 FINAL  Final   Organism ID, Bacteria PROTEUS VULGARIS  Final      Susceptibility   Proteus vulgaris - MIC*    AMPICILLIN >=32 RESISTANT Resistant     CEFAZOLIN >=64 RESISTANT Resistant     CEFEPIME <=0.12 SENSITIVE Sensitive     CEFTAZIDIME <=1 SENSITIVE Sensitive     CIPROFLOXACIN <=0.25 SENSITIVE Sensitive     GENTAMICIN <=1 SENSITIVE Sensitive     IMIPENEM 8 INTERMEDIATE Intermediate     TRIMETH/SULFA <=20 SENSITIVE Sensitive     AMPICILLIN/SULBACTAM >=32 RESISTANT Resistant     PIP/TAZO <=4 SENSITIVE Sensitive     * FEW PROTEUS VULGARIS     Labs: BNP (last 3 results) Recent Labs    11/10/21 1144  BNP [id number]   Basic Metabolic Panel: Recent Labs  Lab 08/11/22 0347 08/14/22 0912 08/16/22 1420 08/17/22 0501  NA 138 131* 133* 133*  K 3.6 3.9 4.3 4.5  CL 106 99 102 105  CO2 25 23 22 23   GLUCOSE 116* 162* 127* 95  BUN 12 16 43* 34*  CREATININE 0.76 0.83 1.85* 1.36*  CALCIUM 8.0* 8.6* 8.4* 8.7*  MG 1.7 1.8  --   --    Liver Function Tests: No results for input(s): "AST", "ALT", "ALKPHOS",  "BILITOT", "PROT", "ALBUMIN" in the last 168 hours.  No results for input(s): "LIPASE", "AMYLASE" in the last 168 hours. No results for input(s): "AMMONIA" in the last 168 hours. CBC: Recent Labs  Lab 08/10/22 2100  WBC 11.1*  NEUTROABS 7.6  HGB 10.8*  HCT 31.6*  MCV 84.0  PLT 194   Cardiac Enzymes: No results for input(s): "CKTOTAL", "CKMB", "CKMBINDEX", "TROPONINI" in the last 168 hours. BNP: Invalid input(s): "POCBNP" CBG: Recent Labs  Lab 08/16/22 0807 08/16/22 1127 08/16/22 1604 08/16/22 2006 08/17/22 0730  GLUCAP 147* 169* 127* 147* 92   D-Dimer No results for input(s): "DDIMER" in the last 72 hours. Hgb A1c No results for input(s): "HGBA1C" in the last 72 hours. Lipid Profile No results for input(s): "CHOL", "HDL", "LDLCALC", "TRIG", "CHOLHDL", "LDLDIRECT" in the last 72 hours. Thyroid function studies No results for input(s): "TSH", "T4TOTAL", "T3FREE", "THYROIDAB" in the last 72 hours.  Invalid input(s): "FREET3" Anemia work up No results for input(s): "VITAMINB12", "FOLATE", "FERRITIN", "TIBC", "IRON", "RETICCTPCT" in the last 72 hours. Urinalysis    Component Value  Date/Time   COLORURINE YELLOW 08/06/2022 2347   APPEARANCEUR TURBID (A) 08/06/2022 2347   LABSPEC 1.027 08/06/2022 2347   PHURINE 5.0 08/06/2022 2347   GLUCOSEU NEGATIVE 08/06/2022 2347   HGBUR NEGATIVE 08/06/2022 2347   BILIRUBINUR NEGATIVE 08/06/2022 2347   BILIRUBINUR negative 12/06/2015 0848   BILIRUBINUR neg 02/23/2013 0904   KETONESUR 20 (A) 08/06/2022 2347   PROTEINUR NEGATIVE 08/06/2022 2347   UROBILINOGEN 0.2 12/06/2015 0848   UROBILINOGEN 0.2 12/25/2009 1310   NITRITE NEGATIVE 08/06/2022 2347   LEUKOCYTESUR NEGATIVE 08/06/2022 2347   Sepsis Labs Recent Labs  Lab 08/10/22 2100  WBC 11.1*   Microbiology Recent Results (from the past 240 hour(s))  Aerobic/Anaerobic Culture w Gram Stain (surgical/deep wound)     Status: None   Collection Time: 08/09/22 12:10 PM    Specimen: Other Source; Body Fluid  Result Value Ref Range Status   Specimen Description WOUND  Final   Special Requests  GLUTEAL  Final   Gram Stain   Final    RARE WBC PRESENT,BOTH PMN AND MONONUCLEAR RARE GRAM NEGATIVE RODS    Culture   Final    FEW PROTEUS VULGARIS NO ANAEROBES ISOLATED Performed at [facility], [address], [place], [place] [address]    Report Status 08/14/2022 FINAL  Final   Organism ID, Bacteria PROTEUS VULGARIS  Final      Susceptibility   Proteus vulgaris - MIC*    AMPICILLIN >=32 RESISTANT Resistant     CEFAZOLIN >=64 RESISTANT Resistant     CEFEPIME <=0.12 SENSITIVE Sensitive     CEFTAZIDIME <=1 SENSITIVE Sensitive     CIPROFLOXACIN <=0.25 SENSITIVE Sensitive     GENTAMICIN <=1 SENSITIVE Sensitive     IMIPENEM 8 INTERMEDIATE Intermediate     TRIMETH/SULFA <=20 SENSITIVE Sensitive     AMPICILLIN/SULBACTAM >=32 RESISTANT Resistant     PIP/TAZO <=4 SENSITIVE Sensitive     * FEW PROTEUS VULGARIS    SIGNED:   Charlynne Cousins, MD  [facility] 08/17/2022, 7:43 AM Pager   If 7PM-7AM, please contact night-coverage [email or web address] Password [email or web address]

## 2022-08-17 NOTE — Plan of Care (Signed)

## 2022-08-17 NOTE — Progress Notes (Signed)
Occupational Therapy Treatment Patient Details Name: Connie Wilson MRN: [id number] DOB: 12/31/1955 Today's Date: 08/17/2022   History of present illness Pt is a 66 y.o. female who presented 08/06/22 after being found down at home with AMS. Pt admitted with acute metabolic encephalopathy associated with sepsis. PMH: DM2, HTN, TIA, depression, anxiety, recurrent UTI, HLD.   OT comments  Pt continues to demonstrate poor safety awareness and impaired cognition. Worked with OT with encouragement, pt highly focused on wanting to sleep or eat a snack. Pt rolling with assist of rail, raised trunk with mod assist to sit EOB. Participated in grooming with close supervision and UB dressing. Pt declined further activity, stating she was fatigued. Returned to sidelying in bed. Assisted pt with ordering yogurt and lunch order. Pt continues to be appropriate for SNF level rehab.    Recommendations for follow up therapy are one component of a multi-disciplinary discharge planning process, led by the attending physician.  Recommendations may be updated based on patient status, additional functional criteria and insurance authorization.    Follow Up Recommendations  Skilled nursing-short term rehab (<3 hours/day)    Assistance Recommended at Discharge Frequent or constant Supervision/Assistance  Patient can return home with the following  A lot of help with walking and/or transfers;A lot of help with bathing/dressing/bathroom;Assist for transportation;Help with stairs or ramp for entrance;Direct supervision/assist for medications management;Direct supervision/assist for financial management   Equipment Recommendations  Other (comment) (defer to next venue)    Recommendations for Other Services      Precautions / Restrictions Precautions Precautions: Fall;Other (comment) Precaution Comments: sacral and back wounds Restrictions Weight Bearing Restrictions: No       Mobility Bed Mobility Overal bed  mobility: Needs Assistance Bed Mobility: Rolling, Sidelying to Sit, Sit to Sidelying Rolling: Modified independent (Device/Increase time) Sidelying to sit: Mod assist     Sit to sidelying: Min assist General bed mobility comments: pt using rail to roll, assist to raise trunk and for LEs back into bed, left pt in R sidelying    Transfers                         Balance Overall balance assessment: Needs assistance   Sitting balance-Leahy Scale: Fair Sitting balance - Comments: close guard for safety while engaged in grooming and UB dressing                                   ADL either performed or assessed with clinical judgement   ADL Overall ADL's : Needs assistance/impaired     Grooming: Wash/dry hands;Wash/dry face;Sitting;Supervision/safety;Brushing hair Grooming Details (indicate cue type and reason): EOB         Upper Body Dressing : Minimal assistance;Sitting                          Extremity/Trunk Assessment              Vision       Perception     Praxis      Cognition Arousal/Alertness: Awake/alert Behavior During Therapy: Flat affect Overall Cognitive Status: Impaired/Different from baseline Area of Impairment: Orientation, Attention, Memory, Following commands, Safety/judgement, Awareness, Problem solving                 Orientation Level: Disoriented to, Place, Time, Situation Current Attention Level: Sustained Memory: Decreased recall of precautions, Decreased  short-term memory Following Commands: Follows one step commands inconsistently, Follows one step commands with increased time Safety/Judgement: Decreased awareness of safety, Decreased awareness of deficits Awareness: Intellectual Problem Solving: Slow processing, Requires verbal cues, Requires tactile cues, Difficulty sequencing General Comments: pt aware she will be going to a facility, highly focused on wanting to eat--assisted pt in ordering  yogurt and changed lunch order as per her request        Exercises      Shoulder Instructions       General Comments      Pertinent Vitals/ Pain       Pain Assessment Pain Assessment: Faces Faces Pain Scale: Hurts a little bit Pain Location: back and sacral wounds with repositioning Pain Descriptors / Indicators: Discomfort, Grimacing Pain Intervention(s): Monitored during session, Repositioned, Premedicated before session  Home Living                                          Prior Functioning/Environment              Frequency  Min 2X/week        Progress Toward Goals  OT Goals(current goals can now be found in the care plan section)  Progress towards OT goals: Progressing toward goals  Acute Rehab OT Goals OT Goal Formulation: With patient Time For Goal Achievement: 08/24/22 Potential to Achieve Goals: Fair  Plan Discharge plan remains appropriate    Co-evaluation                 AM-PAC OT "6 Clicks" Daily Activity     Outcome Measure   Help from another person eating meals?: None Help from another person taking care of personal grooming?: A Little Help from another person toileting, which includes using toliet, bedpan, or urinal?: Total Help from another person bathing (including washing, rinsing, drying)?: A Lot Help from another person to put on and taking off regular upper body clothing?: A Little Help from another person to put on and taking off regular lower body clothing?: Total 6 Click Score: 14    End of Session    OT Visit Diagnosis: Unsteadiness on feet (R26.81);Pain;Muscle weakness (generalized) (M62.81);Cognitive communication deficit (R41.841)   Activity Tolerance Patient limited by fatigue   Patient Left in bed;with call bell/phone within reach   Nurse Communication          Time: 3154-0086 OT Time Calculation (min): 15 min  Charges: OT General Charges $OT Visit: 1 Visit OT Treatments $Self  Care/Home Management : 8-22 mins  Berna Spare, OTR/L Acute Rehabilitation Services Office: [phone]   Evern Bio 08/17/2022, 10:04 AM

## 2022-08-18 LAB — CULTURE, BLOOD (ROUTINE X 2): Special Requests: ADEQUATE

## 2022-09-13 ENCOUNTER — Other Ambulatory Visit: Payer: Self-pay | Admitting: Family Medicine

## 2022-09-13 DIAGNOSIS — R160 Hepatomegaly, not elsewhere classified: Secondary | ICD-10-CM

## 2022-09-13 DIAGNOSIS — K760 Fatty (change of) liver, not elsewhere classified: Secondary | ICD-10-CM

## 2022-09-22 ENCOUNTER — Other Ambulatory Visit: Payer: Self-pay | Admitting: Family Medicine

## 2022-09-22 DIAGNOSIS — K7589 Other specified inflammatory liver diseases: Secondary | ICD-10-CM

## 2022-10-18 ENCOUNTER — Other Ambulatory Visit: Payer: Self-pay | Admitting: Family Medicine

## 2022-10-18 DIAGNOSIS — R4189 Other symptoms and signs involving cognitive functions and awareness: Secondary | ICD-10-CM

## 2022-11-01 ENCOUNTER — Other Ambulatory Visit: Payer: PRIVATE HEALTH INSURANCE

## 2022-11-01 ENCOUNTER — Encounter: Payer: Self-pay | Admitting: Family Medicine

## 2022-11-28 ENCOUNTER — Other Ambulatory Visit: Payer: Medicare HMO

## 2022-12-13 ENCOUNTER — Ambulatory Visit: Payer: Medicare HMO | Admitting: Psychiatry

## 2022-12-21 ENCOUNTER — Other Ambulatory Visit: Payer: Medicare HMO

## 2022-12-27 ENCOUNTER — Encounter: Payer: Self-pay | Admitting: Family Medicine

## 2023-01-04 ENCOUNTER — Other Ambulatory Visit: Payer: Medicare HMO

## 2023-01-10 ENCOUNTER — Other Ambulatory Visit (HOSPITAL_COMMUNITY): Payer: Self-pay | Admitting: Internal Medicine

## 2023-01-10 ENCOUNTER — Other Ambulatory Visit: Payer: Self-pay

## 2023-01-10 DIAGNOSIS — R7401 Elevation of levels of liver transaminase levels: Secondary | ICD-10-CM

## 2023-01-26 ENCOUNTER — Other Ambulatory Visit: Payer: Self-pay | Admitting: Gastroenterology

## 2023-01-26 DIAGNOSIS — R7989 Other specified abnormal findings of blood chemistry: Secondary | ICD-10-CM

## 2023-02-14 ENCOUNTER — Ambulatory Visit
Admission: RE | Admit: 2023-02-14 | Discharge: 2023-02-14 | Disposition: A | Discharge status: Home / Self Care | Payer: Medicare HMO | Source: Ambulatory Visit | Attending: Gastroenterology | Admitting: Gastroenterology

## 2023-02-14 DIAGNOSIS — R7989 Other specified abnormal findings of blood chemistry: Secondary | ICD-10-CM

## 2023-03-09 ENCOUNTER — Other Ambulatory Visit: Payer: Self-pay | Admitting: Urology

## 2023-03-09 DIAGNOSIS — N281 Cyst of kidney, acquired: Secondary | ICD-10-CM

## 2023-05-01 ENCOUNTER — Encounter: Payer: Self-pay | Admitting: Urology

## 2023-05-02 ENCOUNTER — Telehealth: Payer: Self-pay | Admitting: Psychiatry

## 2023-05-02 ENCOUNTER — Encounter: Payer: Self-pay | Admitting: Psychiatry

## 2023-05-02 ENCOUNTER — Ambulatory Visit
Admission: RE | Admit: 2023-05-02 | Discharge: 2023-05-02 | Discharge status: Home / Self Care | Payer: No Typology Code available for payment source | Source: Ambulatory Visit | Attending: Urology | Admitting: Urology

## 2023-05-02 ENCOUNTER — Ambulatory Visit (INDEPENDENT_AMBULATORY_CARE_PROVIDER_SITE_OTHER): Payer: Medicare HMO | Admitting: Psychiatry

## 2023-05-02 VITALS — BP 142/84 | HR 92 | Ht 69.0 in | Wt 202.2 lb

## 2023-05-02 DIAGNOSIS — R413 Other amnesia: Secondary | ICD-10-CM | POA: Diagnosis not present

## 2023-05-02 DIAGNOSIS — N281 Cyst of kidney, acquired: Secondary | ICD-10-CM

## 2023-05-02 MED ORDER — GADOPICLENOL 0.5 MMOL/ML IV SOLN
9.0000 mL | Freq: Once | INTRAVENOUS | Status: AC | PRN
  Administered 2023-05-02: 9 mL via INTRAVENOUS

## 2023-05-02 NOTE — Telephone Encounter (Signed)
Referral faxed to [facility]: Phone: [phone]  Fax: [phone]
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
## 2023-05-03 LAB — VITAMIN B12: Vitamin B-12: 531 pg/mL (ref 232–1245)

## 2023-06-30 ENCOUNTER — Other Ambulatory Visit: Payer: Self-pay | Admitting: Gastroenterology

## 2023-06-30 DIAGNOSIS — K862 Cyst of pancreas: Secondary | ICD-10-CM

## 2023-07-10 ENCOUNTER — Encounter: Payer: Self-pay | Admitting: Psychiatry

## 2023-07-10 ENCOUNTER — Other Ambulatory Visit: Payer: Self-pay | Admitting: Psychiatry

## 2023-07-10 DIAGNOSIS — G309 Alzheimer's disease, unspecified: Secondary | ICD-10-CM

## 2023-07-18 ENCOUNTER — Telehealth: Payer: Self-pay | Admitting: Psychiatry

## 2023-07-18 NOTE — Telephone Encounter (Signed)
Faxed Order for PET Scan to [facility] on 07/18/2023

## 2023-07-18 NOTE — Telephone Encounter (Addendum)
At 2:46 Pt's daughter left a vm asking that documentation of the order for the needed PET scan pt is to have be faxed to the nursing home where pt lives [facility]) their fax# is [phone]

## 2023-07-19 NOTE — Telephone Encounter (Addendum)
LVM at from patient's daughter, Connie Wilson, she lives in a nursing home and the doctor ordered a PET Scan of her brain. When called to schedule that they said there were several things; she NCO for 6 hours, but she could have water and also could not take her diabetes medicine that day. In order for nursing home to a abide those regulations they need to have  that in writing  and send as an order. Called yesterday and the nursing call back today and said the information they received did not lay out those provisions. If you could send over something that specifically say  what nursing home needs to do to properly prepare her this exam. Going to be next Thursday, October 17. Their fax number [phone]. If could please send over some information to them filling out for her. If you have any questions for me can call [phone].

## 2023-07-19 NOTE — Telephone Encounter (Signed)
Dr. Teresa Coombs, can you help with this? Dr. Delena Bali pt. You are work in this afternoon.

## 2023-07-20 ENCOUNTER — Ambulatory Visit
Admission: RE | Admit: 2023-07-20 | Discharge: 2023-07-20 | Disposition: A | Discharge status: Home / Self Care | Payer: Medicare HMO | Source: Ambulatory Visit | Attending: Gastroenterology | Admitting: Gastroenterology

## 2023-07-20 DIAGNOSIS — K862 Cyst of pancreas: Secondary | ICD-10-CM

## 2023-07-20 MED ORDER — IOPAMIDOL (ISOVUE-300) INJECTION 61%
500.0000 mL | Freq: Once | INTRAVENOUS | Status: AC | PRN
  Administered 2023-07-20: 100 mL via INTRAVENOUS

## 2023-07-20 NOTE — Telephone Encounter (Signed)
Faxed signed order below to [facility]. Received fax confirmation.

## 2023-07-20 NOTE — Telephone Encounter (Signed)
Form completed.

## 2023-07-24 ENCOUNTER — Telehealth: Payer: Self-pay

## 2023-07-24 NOTE — Telephone Encounter (Signed)
Called facility to remind them that pt can't eat anything for 6 hours for her PET Scan on 07/27/2023.

## 2023-07-27 ENCOUNTER — Encounter (HOSPITAL_COMMUNITY): Payer: Medicare HMO

## 2023-07-27 NOTE — Telephone Encounter (Signed)
The first PA was denied and it says they sent a denial letter but it may have gone to the fax number we no longer have and this was Dr. Quentin Mulling patient. I submitted a new PA [employer] [id number]. It may also get denied since there was another denial in the last 60 days. I will see what happens, but we may have to have someone else here do a peer to peer or try the Pet Amyloid. I talked to her daughter and advised her not to reschedule the Pet scan until we have an approval.

## 2023-08-03 ENCOUNTER — Other Ambulatory Visit: Payer: Self-pay | Admitting: Neurology

## 2023-08-03 DIAGNOSIS — G309 Alzheimer's disease, unspecified: Secondary | ICD-10-CM

## 2023-08-03 NOTE — Telephone Encounter (Signed)
I sent the order to [facility] to be scheduled. [phone]

## 2023-08-03 NOTE — Telephone Encounter (Signed)
Amyloid PET Scan ordered. Thanks

## 2023-08-03 NOTE — Telephone Encounter (Signed)
Sending to the work in doctor: Dr. Delena Bali had ordered a metabolic pet scan and her insurance company will not approve it, I have tried multiple times. She lives in a facility and was already scheduled and cancelled on once. I submitted a PA for a amyloid pet to see if it would get approved and it did. Would she be able to get this pet scan done instead?  [employer] Berkley Harvey: [phone]  exp. 07/27/2023 - 09/25/2023

## 2023-08-14 ENCOUNTER — Other Ambulatory Visit (HOSPITAL_COMMUNITY): Payer: Medicare HMO

## 2023-08-25 ENCOUNTER — Encounter (HOSPITAL_COMMUNITY)
Admission: RE | Admit: 2023-08-25 | Discharge: 2023-08-25 | Disposition: A | Discharge status: Home / Self Care | Payer: Medicare HMO | Source: Ambulatory Visit | Attending: Neurology

## 2023-08-25 DIAGNOSIS — G309 Alzheimer's disease, unspecified: Secondary | ICD-10-CM | POA: Insufficient documentation

## 2023-08-25 MED ORDER — FLORBETAPIR F 18 500-1900 MBQ/ML IV SOLN
10.0850 | Freq: Once | INTRAVENOUS | Status: AC
  Administered 2023-08-25: 10.085 via INTRAVENOUS

## 2023-08-28 NOTE — Progress Notes (Signed)
Please call and inform patient that the PET amyloid was negative for Alzheimer disease pathology.

## 2023-08-28 NOTE — Telephone Encounter (Signed)
She was seen by Neuropsych and diagnosed with Frontotemporal dementia (PET amyloid negative for Alzheimer disease), at this point, just symptomatic management either with PCP or Neurology (She has seen [facility] in the past).
